# Patient Record
Sex: Male | Born: 1935 | Race: White | Hispanic: No | Marital: Married | Smoking: Former smoker
Health system: Southern US, Community
[De-identification: ages and names within clinical notes are randomized; demographics above are authoritative.]

## PROBLEM LIST (undated history)

## (undated) DIAGNOSIS — I700 Atherosclerosis of aorta: Secondary | ICD-10-CM

## (undated) DIAGNOSIS — Z950 Presence of cardiac pacemaker: Secondary | ICD-10-CM

## (undated) DIAGNOSIS — I10 Essential (primary) hypertension: Secondary | ICD-10-CM

## (undated) DIAGNOSIS — L719 Rosacea, unspecified: Secondary | ICD-10-CM

## (undated) DIAGNOSIS — N400 Enlarged prostate without lower urinary tract symptoms: Secondary | ICD-10-CM

## (undated) DIAGNOSIS — H9190 Unspecified hearing loss, unspecified ear: Secondary | ICD-10-CM

## (undated) DIAGNOSIS — R350 Frequency of micturition: Secondary | ICD-10-CM

## (undated) DIAGNOSIS — M1990 Unspecified osteoarthritis, unspecified site: Secondary | ICD-10-CM

## (undated) DIAGNOSIS — E079 Disorder of thyroid, unspecified: Secondary | ICD-10-CM

## (undated) DIAGNOSIS — K219 Gastro-esophageal reflux disease without esophagitis: Secondary | ICD-10-CM

## (undated) DIAGNOSIS — J342 Deviated nasal septum: Secondary | ICD-10-CM

## (undated) DIAGNOSIS — IMO0002 Reserved for concepts with insufficient information to code with codable children: Secondary | ICD-10-CM

## (undated) DIAGNOSIS — I2510 Atherosclerotic heart disease of native coronary artery without angina pectoris: Secondary | ICD-10-CM

## (undated) DIAGNOSIS — I739 Peripheral vascular disease, unspecified: Secondary | ICD-10-CM

## (undated) DIAGNOSIS — I639 Cerebral infarction, unspecified: Secondary | ICD-10-CM

## (undated) DIAGNOSIS — E785 Hyperlipidemia, unspecified: Secondary | ICD-10-CM

## (undated) DIAGNOSIS — I480 Paroxysmal atrial fibrillation: Secondary | ICD-10-CM

## (undated) DIAGNOSIS — E119 Type 2 diabetes mellitus without complications: Secondary | ICD-10-CM

## (undated) DIAGNOSIS — J189 Pneumonia, unspecified organism: Secondary | ICD-10-CM

## (undated) DIAGNOSIS — M329 Systemic lupus erythematosus, unspecified: Secondary | ICD-10-CM

## (undated) HISTORY — DX: Type 2 diabetes mellitus without complications: E11.9

## (undated) HISTORY — DX: Atherosclerosis of aorta: I70.0

## (undated) HISTORY — DX: Disorder of thyroid, unspecified: E07.9

## (undated) HISTORY — PX: CARDIAC CATHETERIZATION: SHX172

## (undated) HISTORY — DX: Atherosclerotic heart disease of native coronary artery without angina pectoris: I25.10

## (undated) HISTORY — DX: Paroxysmal atrial fibrillation: I48.0

## (undated) HISTORY — PX: CATARACT EXTRACTION W/ INTRAOCULAR LENS IMPLANT: SHX1309

## (undated) HISTORY — DX: Essential (primary) hypertension: I10

## (undated) HISTORY — PX: PROSTATE BIOPSY: SHX241

## (undated) HISTORY — DX: Hyperlipidemia, unspecified: E78.5

## (undated) HISTORY — DX: Peripheral vascular disease, unspecified: I73.9

## (undated) HISTORY — PX: COLONOSCOPY: SHX174

## (undated) HISTORY — PX: MULTIPLE TOOTH EXTRACTIONS: SHX2053

## (undated) HISTORY — DX: Benign prostatic hyperplasia without lower urinary tract symptoms: N40.0

## (undated) HISTORY — DX: Cerebral infarction, unspecified: I63.9

---

## 2004-07-29 ENCOUNTER — Ambulatory Visit: Payer: Self-pay | Admitting: Physician Assistant

## 2006-04-11 ENCOUNTER — Ambulatory Visit: Payer: Self-pay | Admitting: Unknown Physician Specialty

## 2006-07-23 ENCOUNTER — Ambulatory Visit: Payer: Self-pay | Admitting: Unknown Physician Specialty

## 2008-02-10 ENCOUNTER — Emergency Department: Payer: Self-pay | Admitting: Emergency Medicine

## 2008-02-10 ENCOUNTER — Other Ambulatory Visit: Payer: Self-pay

## 2008-06-12 ENCOUNTER — Ambulatory Visit: Payer: Self-pay | Admitting: Internal Medicine

## 2011-02-08 ENCOUNTER — Inpatient Hospital Stay: Payer: Self-pay | Admitting: Internal Medicine

## 2011-08-10 ENCOUNTER — Inpatient Hospital Stay: Payer: Self-pay | Admitting: Internal Medicine

## 2011-08-10 LAB — CBC
HCT: 43.9 % (ref 40.0–52.0)
HGB: 15.1 g/dL (ref 13.0–18.0)
MCH: 31 pg (ref 26.0–34.0)
Platelet: 160 10*3/uL (ref 150–440)
RDW: 13.3 % (ref 11.5–14.5)

## 2011-08-10 LAB — CK TOTAL AND CKMB (NOT AT ARMC)
CK, Total: 50 U/L (ref 35–232)
CK-MB: 1.1 ng/mL (ref 0.5–3.6)
CK-MB: 0.9 ng/mL (ref 0.5–3.6)

## 2011-08-10 LAB — COMPREHENSIVE METABOLIC PANEL
Albumin: 4 g/dL (ref 3.4–5.0)
Anion Gap: 11 (ref 7–16)
BUN: 15 mg/dL (ref 7–18)
EGFR (African American): >60
EGFR (Non-African Amer.): >60
Glucose: 165 mg/dL — ABNORMAL HIGH (ref 65–99)
Potassium: 3.9 mmol/L (ref 3.5–5.1)
SGOT(AST): 27 U/L (ref 15–37)
SGPT (ALT): 40 U/L
Total Protein: 7.3 g/dL (ref 6.4–8.2)

## 2011-08-10 LAB — TROPONIN I: Troponin-I: <0.02 ng/mL

## 2011-08-10 LAB — PROTIME-INR: INR: 0.8

## 2011-08-11 LAB — CBC WITH DIFFERENTIAL/PLATELET
Basophil #: 0 10*3/uL (ref 0.0–0.1)
Basophil %: 0.6 %
Eosinophil #: 0.2 10*3/uL (ref 0.0–0.7)
Eosinophil %: 2.7 %
HCT: 43.9 % (ref 40.0–52.0)
HGB: 14.9 g/dL (ref 13.0–18.0)
MCH: 30.9 pg (ref 26.0–34.0)
MCV: 91 fL (ref 80–100)
Monocyte #: 0.6 10*3/uL (ref 0.0–0.7)
Monocyte %: 8.5 %
Neutrophil #: 4.3 10*3/uL (ref 1.4–6.5)
Neutrophil %: 58.9 %
RBC: 4.83 10*6/uL (ref 4.40–5.90)
WBC: 7.3 10*3/uL (ref 3.8–10.6)

## 2011-08-11 LAB — BASIC METABOLIC PANEL
Chloride: 105 mmol/L (ref 98–107)
Co2: 26 mmol/L (ref 21–32)
Creatinine: 0.87 mg/dL (ref 0.60–1.30)
Glucose: 139 mg/dL — ABNORMAL HIGH (ref 65–99)
Potassium: 4.3 mmol/L (ref 3.5–5.1)
Sodium: 140 mmol/L (ref 136–145)

## 2011-08-11 LAB — CK TOTAL AND CKMB (NOT AT ARMC): CK-MB: 0.7 ng/mL (ref 0.5–3.6)

## 2011-08-11 LAB — LIPID PANEL
Cholesterol: 128 mg/dL (ref 0–200)
HDL Cholesterol: 33 mg/dL — ABNORMAL LOW (ref 40–60)
Ldl Cholesterol, Calc: 63 mg/dL (ref 0–100)
Triglycerides: 160 mg/dL (ref 0–200)

## 2011-08-11 LAB — TROPONIN I: Troponin-I: <0.02 ng/mL

## 2011-08-11 LAB — HEMOGLOBIN A1C: Hemoglobin A1C: 7.2 % — ABNORMAL HIGH (ref 4.2–6.3)

## 2013-04-30 ENCOUNTER — Ambulatory Visit: Payer: Self-pay | Admitting: Unknown Physician Specialty

## 2013-05-01 LAB — PATHOLOGY REPORT

## 2013-09-17 ENCOUNTER — Emergency Department: Payer: Self-pay | Admitting: Emergency Medicine

## 2013-09-17 LAB — COMPREHENSIVE METABOLIC PANEL
ALBUMIN: 3.8 g/dL (ref 3.4–5.0)
ALT: 28 U/L (ref 12–78)
ANION GAP: 5 — AB (ref 7–16)
Alkaline Phosphatase: 122 U/L — ABNORMAL HIGH
BUN: 16 mg/dL (ref 7–18)
Bilirubin,Total: 0.4 mg/dL (ref 0.2–1.0)
CHLORIDE: 103 mmol/L (ref 98–107)
CO2: 28 mmol/L (ref 21–32)
Calcium, Total: 9.9 mg/dL (ref 8.5–10.1)
Creatinine: 0.87 mg/dL (ref 0.60–1.30)
EGFR (African American): >60
GLUCOSE: 204 mg/dL — AB (ref 65–99)
Osmolality: 279 (ref 275–301)
POTASSIUM: 3.5 mmol/L (ref 3.5–5.1)
SGOT(AST): 22 U/L (ref 15–37)
SODIUM: 136 mmol/L (ref 136–145)
TOTAL PROTEIN: 7 g/dL (ref 6.4–8.2)

## 2013-09-17 LAB — CK TOTAL AND CKMB (NOT AT ARMC)
CK, TOTAL: 62 U/L
CK-MB: 1.4 ng/mL (ref 0.5–3.6)

## 2013-09-17 LAB — CBC
HCT: 40.9 % (ref 40.0–52.0)
HGB: 14.6 g/dL (ref 13.0–18.0)
MCH: 30.2 pg (ref 26.0–34.0)
MCHC: 35.7 g/dL (ref 32.0–36.0)
MCV: 85 fL (ref 80–100)
Platelet: 156 10*3/uL (ref 150–440)
RBC: 4.84 10*6/uL (ref 4.40–5.90)
RDW: 14.2 % (ref 11.5–14.5)
WBC: 8.9 10*3/uL (ref 3.8–10.6)

## 2013-09-17 LAB — TROPONIN I: Troponin-I: <0.02 ng/mL

## 2013-09-18 LAB — URINALYSIS, COMPLETE
BACTERIA: NONE SEEN
BILIRUBIN, UR: NEGATIVE
BLOOD: NEGATIVE
Glucose,UR: >=500 mg/dL (ref 0–75)
KETONE: NEGATIVE
Leukocyte Esterase: NEGATIVE
Nitrite: NEGATIVE
Ph: 6 (ref 4.5–8.0)
Protein: NEGATIVE
RBC,UR: 2 /HPF (ref 0–5)
SPECIFIC GRAVITY: 1.007 (ref 1.003–1.030)
Squamous Epithelial: NONE SEEN
WBC UR: NONE SEEN /HPF (ref 0–5)

## 2013-09-18 LAB — CK-MB: CK-MB: 1 ng/mL (ref 0.5–3.6)

## 2013-09-18 LAB — TROPONIN I: Troponin-I: <0.02 ng/mL

## 2013-09-18 LAB — TSH: THYROID STIMULATING HORM: 0.59 u[IU]/mL

## 2013-12-24 ENCOUNTER — Ambulatory Visit: Payer: Self-pay | Admitting: Internal Medicine

## 2014-03-27 ENCOUNTER — Telehealth: Payer: Self-pay

## 2014-03-27 NOTE — Telephone Encounter (Signed)
l  Mom for pt to schedule appt w dr Klein(NP) per Dr. Serafina Royals for afib.

## 2014-04-27 ENCOUNTER — Encounter: Payer: Self-pay | Admitting: *Deleted

## 2014-04-27 ENCOUNTER — Ambulatory Visit: Payer: Self-pay | Admitting: Ophthalmology

## 2014-04-28 ENCOUNTER — Encounter: Payer: Self-pay | Admitting: Internal Medicine

## 2014-04-28 ENCOUNTER — Ambulatory Visit (INDEPENDENT_AMBULATORY_CARE_PROVIDER_SITE_OTHER): Payer: Medicare PPO | Admitting: Internal Medicine

## 2014-04-28 VITALS — BP 98/59 | HR 60 | Ht 67.0 in | Wt 161.8 lb

## 2014-04-28 DIAGNOSIS — I480 Paroxysmal atrial fibrillation: Secondary | ICD-10-CM

## 2014-04-28 DIAGNOSIS — Z8673 Personal history of transient ischemic attack (TIA), and cerebral infarction without residual deficits: Secondary | ICD-10-CM

## 2014-04-28 DIAGNOSIS — R079 Chest pain, unspecified: Secondary | ICD-10-CM

## 2014-04-28 DIAGNOSIS — R001 Bradycardia, unspecified: Secondary | ICD-10-CM

## 2014-04-28 NOTE — Patient Instructions (Signed)
Your physician recommends that you schedule a follow-up appointment in:  3 months   Your next appointment will be scheduled in our new office located at :  [facility]  [address] [place]  [place], [place] [address]  Your physician recommends that you continue on your current medications as directed. Please refer to the Current Medication list given to you today.

## 2014-04-28 NOTE — Progress Notes (Signed)
ELECTROPHYSIOLOGY CONSULT NOTE  Patient ID: Ernest [name], MRN: [id number], DOB/AGE: [birth year] 78 y.o. Admit date: (Not on file) Date of Consult: 04/28/2014  Primary Physician: [name], MD Primary CardiologistBKowalski Chief Complaint:  Atrial fibrillaton   HPI Ernest [name] is a 78 y.o. male  Referred to internist and Dr. Nehemiah Massed for consideration of treatment options related to atrial fibrillation.  This has been going on for years episodes today lasting 3-4 hours.  Symptoms associated with this are largely chest discomfort without radiation. He has had 2  separate heart catheterizations which revealed only single vessel circumflex disease at about 70%. Initially this was done in 2009 and most recently 6/15. He is otherwise modest nonobstructive disease. LV function was normal at echocardiogram 3/15 indicating also modest left atrial enlargement (4.3) this was largely unchanged from an echocardiogram 8/12.  Triggers associated with these episodes of atrial fibrillation including exertion and according to his wife stress.  Thromboembolic risk factors include hypertension, diabetes, age, vascular disease and prior stroke. He has been treated with apixaban.   Past Medical History  Diagnosis Date  . Thyroid disease   . Hyperlipidemia   . BPH (benign prostatic hypertrophy)   . Diabetes mellitus without complication   . Hypertension   . Coronary artery disease   . Aortic atherosclerosis   . PVD (peripheral vascular disease)   . Stroke   . A-fib   . Arrhythmia       Surgical History:  Past Surgical History  Procedure Laterality Date  . Colonoscopy    . Prostate biopsy    . Cardiac catheterization      [facility]  . Cardiac catheterization      [facility]  . Cardiac catheterization      [facility]     Home Meds: Prior to Admission medications   Medication Sig Start Date End Date Taking? Authorizing Provider  apixaban (ELIQUIS) 5 MG TABS tablet Take 5 mg by mouth 2  (two) times daily.  02/25/14  Yes Historical Provider, MD  esomeprazole (NEXIUM) 20 MG capsule Take 20 mg by mouth daily.    Yes Historical Provider, MD  isosorbide mononitrate (IMDUR) 30 MG 24 hr tablet Take 30 mg by mouth daily.  11/06/13  Yes Historical Provider, MD  levothyroxine (SYNTHROID, LEVOTHROID) 125 MCG tablet Take 125 mcg by mouth daily before breakfast.   Yes Historical Provider, MD  lovastatin (MEVACOR) 40 MG tablet Take 40 mg by mouth at bedtime.    Yes Historical Provider, MD  metFORMIN (GLUCOPHAGE) 500 MG tablet Take 500 mg by mouth 2 (two) times daily with a meal.    Yes Historical Provider, MD  metoprolol tartrate (LOPRESSOR) 25 MG tablet Take 25 mg by mouth 2 (two) times daily.   Yes Historical Provider, MD  Pediatric Multiple Vit-C-FA (CHEWABLE VITE CHILDRENS) CHEW Chew by mouth daily.    Yes Historical Provider, MD  tamsulosin (FLOMAX) 0.4 MG CAPS capsule Take 0.4 mg by mouth daily.    Yes Historical Provider, MD      Allergies: No Known Allergies  History   Social History  . Marital Status: Married    Spouse Name: N/A    Number of Children: N/A  . Years of Education: N/A   Occupational History  . Not on file.   Social History Main Topics  . Smoking status: Former Research scientist (life sciences)  . Smokeless tobacco: Not on file  . Alcohol Use: No  . Drug Use: No  . Sexual Activity: Not on  file   Other Topics Concern  . Not on file   Social History Narrative  . No narrative on file     Family History  Problem Relation Age of Onset  . Heart attack Mother   . Heart attack Father      ROS:  Please see the history of present illness.   Negative except  Prostatism, stress,  All other systems reviewed and negative.    Physical Exam: Blood pressure 98/59, pulse 60, height 5\' 7"  (1.702 m), weight 161 lb 12 oz (73.369 kg). General: Well developed, well nourished male in no acute distress. Head: Normocephalic, atraumatic, sclera non-icteric, no xanthomas, nares are without  discharge. EENT: normal Lymph Nodes:  none Back: without scoliosis/kyphosis , no CVA tendersness Neck: Negative for carotid bruits. JVD not elevated. Lungs: Clear bilaterally to auscultation without wheezes, rales, or rhonchi. Breathing is unlabored. Heart: RRR with S1 S2.  2/6 systolic murmur , rubs, or gallops appreciated. Abdomen: Soft, non-tender, non-distended with normoactive bowel sounds. No hepatomegaly. No rebound/guarding. No obvious abdominal masses. Msk:  Strength and tone appear normal for age. Extremities: No clubbing or cyanosis. No edema.  Distal pedal pulses are 2+ and equal bilaterally. Skin: Warm and Dry Neuro: Alert and oriented X 3. CN III-XII intact Grossly normal sensory and motor function . Psych:  Responds to questions appropriately with a normal affect.      Labs: Cardiac Enzymes No results found for this basename: CKTOTAL, CKMB, TROPONINI,  in the last 72 hours CBC No results found for this basename: WBC, HGB, HCT, MCV, PLT   PROTIME: No results found for this basename: LABPROT, INR,  in the last 72 hours Chemistry No results found for this basename: NA, K, CL, CO2, BUN, CREATININE, CALCIUM, LABALBU, PROT, BILITOT, ALKPHOS, ALT, AST, GLUCOSE,  in the last 168 hours Lipids No results found for this basename: CHOL, HDL, LDLCALC, TRIG   BNP No results found for this basename: probnp   Miscellaneous No results found for this basename: DDIMER    Radiology/Studies:  No results found.  EKG:  Sinus rhythm at 60 with intervals 15/09/38 Otherwise normal  Holter monitors were reviewed from 9/15. Demonstrating frequent nonsustained runs of atrial tachycardia, minimum heart rates during waking hours in the 60 range  Assessment and Plan:  Paroxysmal atrial fibrillation  Prior stroke  Coronary artery disease-1 vessel  Sinus bradycardia   The patient has tachybradycardia syndrome with significant symptoms associated with rapid atrial fibrillation. Given  his propensity towards bradycardia, I would lean towards a rhythm control in strategy the options which could include antiarrhythmic therapy specifically initially dofetilide and then amiodarone, the latter likely necessitating a need for backup bradycardia pacing. He is not a candidate for a 1C agent because of his coronary artery disease.   It is this issue of underlying bradycardia that makes it less enthusiastic about a rate control in strategy; given the rate of his atrial fibrillation--200 beats per minute, I am not sanguine that we will be able to accomplish this anyway without the need for AV ablation  We discussed alternatives to apixaban given cost. His wife elected to stay on apixaban.  At this juncture they would like to continue the current course of therapy. We will regroup in about 3 months to review options again.Virl Axe

## 2014-07-27 ENCOUNTER — Ambulatory Visit: Payer: Self-pay | Admitting: Physical Medicine and Rehabilitation

## 2014-08-04 ENCOUNTER — Ambulatory Visit (INDEPENDENT_AMBULATORY_CARE_PROVIDER_SITE_OTHER): Payer: Commercial Managed Care - HMO | Admitting: Internal Medicine

## 2014-08-04 ENCOUNTER — Encounter: Payer: Self-pay | Admitting: Internal Medicine

## 2014-08-04 VITALS — BP 118/72 | HR 68 | Ht 67.0 in | Wt 168.0 lb

## 2014-08-04 DIAGNOSIS — I10 Essential (primary) hypertension: Secondary | ICD-10-CM

## 2014-08-04 NOTE — Progress Notes (Signed)
Electrophysiology Office Note   Date:  08/04/2014   ID:  Ernest [name], DOB 09-20-1935, MRN [id number]  PCP:  Ernest Fennel, MD  Cardiologist:  NA-TF Primary Electrophysiologist:   Ernest Axe, MD    No chief complaint on file.    History of Present Illness: Ernest [name] is a 79 y.o. male seen in electrophysiology followup  for  atrial fibrillation-paroxysmal with rapid ventricular response associated with sinus bradycardia. He has a history of coronary disease. He is managed with apixaban.  At the last visit we discussed alternative strategies of rate control versus rhythm control. The concerns with the former was that it would precipitate the need for backup bradycardia pacing; the use of amiodarone for almost certainly accomplish the same thing. We discussed dofetilide. At the last visit they decided to hold the steady course with the plan to repeat group to discuss again.   Today, he denies symptoms of  chest pain, shortness of breath, orthopnea, PND, lower extremity edema, bleeding, or neurologic sequela.  he has no  complaints of  lightheadedness presyncope or syncope.  He has had infrequent episodes of palpitations consistent with his atrial fibrillation; it has largely been however relatively brief and not poorly tolerated  The patient is tolerating medications without difficulties and is otherwise without complaint today.    Past Medical History  Diagnosis Date  . Thyroid disease   . Hyperlipidemia   . BPH (benign prostatic hypertrophy)   . Diabetes mellitus without complication   . Hypertension   . Coronary artery disease   . Aortic atherosclerosis   . PVD (peripheral vascular disease)   . Stroke   . A-fib   . Arrhythmia    Past Surgical History  Procedure Laterality Date  . Colonoscopy    . Prostate biopsy    . Cardiac catheterization      [facility]  . Cardiac catheter[facility]      [facility]  . Cardiac catheterization      [facility]     Current  Outpatient Prescriptions  Medication Sig Dispense Refill  . apixaban (ELIQUIS) 5 MG TABS tablet Take 5 mg by mouth 2 (two) times daily.     Marland Kitchen esomeprazole (NEXIUM) 20 MG capsule Take 20 mg by mouth daily.     . isosorbide mononitrate (IMDUR) 30 MG 24 hr tablet Take 30 mg by mouth daily.     Marland Kitchen levothyroxine (SYNTHROID, LEVOTHROID) 125 MCG tablet Take 125 mcg by mouth daily before breakfast.    . lovastatin (MEVACOR) 40 MG tablet Take 40 mg by mouth at bedtime.     . metFORMIN (GLUCOPHAGE) 500 MG tablet Take 500 mg by mouth 2 (two) times daily with a meal.     . metoprolol tartrate (LOPRESSOR) 25 MG tablet Take 25 mg by mouth 2 (two) times daily.    . Pediatric Multiple Vit-C-FA (CHEWABLE VITE CHILDRENS) CHEW Chew by mouth daily.     . tamsulosin (FLOMAX) 0.4 MG CAPS capsule Take 0.4 mg by mouth daily.      No current facility-administered medications for this visit.    Allergies:   Review of patient's allergies indicates no known allergies.   Social History:  The patient  reports that he has quit smoking. He does not have any smokeless tobacco history on file. He reports that he does not drink alcohol or use illicit drugs.   Family History:  The patient's family history includes Heart attack in his father and mother.    ROS:  Please see the history of present illness.   Otherwise, review of systems is negative .    PHYSICAL EXAM: VS:  Ht 5\' 7"  (1.702 m) , BMI There is no weight on file to calculate BMI. GEN: Well nourished, well developed, in no acute distress HEENT: normal Neck: JVD flat, carotid bruits, or masses Cardiac: R RR;  no murmur  rubs, + S4  Respiratory:  clear to auscultation bilaterally, normal work of breathing Back without kyphosis or CVAT GI: soft, nontender, nondistended, + BS MS: no deformity or atrophy Skin: warm and dry,  Extremities No Clubbing cyanosis  Edema Neuro:  Strength and sensation are intact Psych: euthymic mood, full affect  EKG:  EKG is ordered  today. The ekg ordered today shows sinus rhythm at 68 Intervals 15/09/37 Otherwise normal   Recent Labs: No results found for requested labs within last 365 days.    Lipid Panel  No results found for: CHOL, TRIG, HDL, CHOLHDL, VLDL, LDLCALC, LDLDIRECT   Wt Readings from Last 3 Encounters:  04/28/14 161 lb 12 oz (73.369 kg)      Other studies Reviewed: none   ASSESSMENT AND PLAN:  Atrial fibrillation-paroxysmal  Coronary artery disease  Prior stroke  Sinus bradycardia  Without symptoms of ischemia  Atrial fibrillation is relatively infrequent. So as to not aggravate his underlying sinus bradycardia, I suggested that if he has recurrent atrial fibrillation that persists for any period of time, he could take an extra metoprolol 25 every 6 hours. At this juncture we will not change our strategy in terms of pursuing rhythm control as the episodes are infrequent and relatively well-tolerated.  He is tolerating his apixaban  Current medicines are reviewed at length with the patient today.   The patient does not have concerns regarding his medicines.  The following changes were made today:  none  Labs/ tests ordered today include:    Orders Placed This Encounter  Procedures  . EKG 12-Lead     Disposition:   FU with me  6 month(s)   Signed, Ernest Axe, MD  08/04/2014 11:37 AM     [place]  [address] [phone] (office) [phone] (fax)

## 2014-08-04 NOTE — Patient Instructions (Signed)
Your physician wants you to follow-up in: 6 months  You will receive a reminder letter in the mail two months in advance. If you don't receive a letter, please call our office to schedule the follow-up appointment.  Your physician recommends that you continue on your current medications as directed. Please refer to the Current Medication list given to you today.  

## 2014-10-31 NOTE — Op Note (Signed)
PATIENT NAME:  Ernest [name], PAFF MR#:  [id number] DATE OF BIRTH:  1936-03-13  DATE OF PROCEDURE:  04/27/2014  PREOPERATIVE DIAGNOSIS: Cataract, right eye.   POSTOPERATIVE DIAGNOSIS:  Cataract, right eye.  PROCEDURE PERFORMED: Extracapsular cataract extraction using phacoemulsification with placement Alcon [id number], 24.5 diopter posterior chamber lens, serial number [id number]   SURGEON: Remo Lipps A. Quantae Martel, MD    ANESTHESIA:  4% lidocaine, 0.75% Marcaine, 50-50 mixture of 10 units/mL of Hylenex added, given as a peribulbar.   ANESTHESIOLOGIST:  Dr. Kayleen Memos   COMPLICATIONS: None.   ESTIMATED BLOOD LOSS: Less than 1 mL.    DESCRIPTION OF PROCEDURE:  The patient was brought to the operating room and given a peribulbar block.  The patient was then prepped and draped in the usual fashion.  The vertical rectus muscles were imbricated using 5-0 silk sutures.  These sutures were then clamped to the sterile drapes as bridle sutures.  A limbal peritomy was performed extending two clock hours and hemostasis was obtained with cautery.  A partial thickness scleral groove was made at the surgical limbus and dissected anteriorly in a lamellar dissection using an Alcon crescent knife.  The anterior chamber was entered superonasally with a Superblade and through the lamellar dissection with a 2.6 mm keratome.  DisCoVisc was used to replace the aqueous and a continuous tear capsulorrhexis was carried out.  Hydrodissection and hydrodelineation were carried out with balanced salt and a 27 gauge canula.  The nucleus was rotated to confirm the effectiveness of the hydrodissection.  Phacoemulsification was carried out using a divide-and-conquer technique.  Total ultrasound time was  2 minute 50 seconds with an average power of 25.3%, CDE of 50.2.  Irrigation/aspiration was used to remove the residual cortex.  DisCoVisc was used to inflate the capsule and the internal incision was enlarged to 3 mm with the  crescent knife.  The intraocular lens was folded and inserted into the capsular bag using the AcrySert delivery system.  Irrigation/aspiration was used to remove the residual DisCoVisc.  Miostat was injected into the anterior chamber through the paracentesis track to inflate the anterior chamber and induce miosis.  A  0.20 mL of cefuroxime containing 1 mg of drug was injected via the paracentesis tract.  The wound was checked for leaks and none were found. The conjunctiva was closed with cautery and the bridle sutures were removed.  Two drops of 0.3% Vigamox were placed on the eye.   An eye shield was placed on the eye.  The patient was discharged to the recovery room in good condition.    ____________________________ Loura Back Zyion Doxtater, MD sad:DT D: 04/27/2014 08:28:04 ET T: 04/27/2014 08:53:55 ET JOB#: [id number]  cc: Remo Lipps A. Keri Veale, MD, <Dictator> Martie Lee MD ELECTRONICALLY SIGNED 04/27/2014 13:34

## 2014-10-31 NOTE — H&P (Signed)
PATIENT NAME:  Ernest [name], Ernest [name] MR#:  [id number] DATE OF BIRTH:  1936-05-14  DATE OF ADMISSION:  09/17/2013  REFERRING PHYSICIAN: Dr. Marjean Donna.   PRIMARY CARE PHYSICIAN: Dr. Baldemar Lenis.    CHIEF COMPLAINT: Chest pain.   HISTORY OF PRESENT ILLNESS: A 79 year old Caucasian gentleman with a past medical history of atrial fibrillation, type 2 diabetes, hypertension, hyperlipidemia, gastroesophageal reflux disease who presented with chest pain. Describes a 1 day duration of retrosternal chest pain which originally occurred at 8 p.m. the day prior to admission. Describes this as nonradiating, quality only as pain, intensity 10 out of 10. No worsening or relieving factors. No further associated symptoms. Denies any shortness of breath, diaphoresis, nausea, vomiting, cough. On arrival to the Emergency Department, he was found to be in atrial fibrillation with rapid ventricular response, heart rate of 140s, with spontaneous conversion to normal sinus rhythm. Currently, he is chest pain-free. He has no further complaints.   REVIEW OF SYSTEMS:  CONSTITUTIONAL: Denies fever, fatigue, weakness.  EYES: Denies blurred vision, double vision, eye pain.  ENT: Denies tinnitus, ear pain, hearing loss.  RESPIRATORY: Denies cough, wheeze, shortness of breath.  CARDIOVASCULAR: Positive for chest pain as described above. Denies any orthopnea, edema, palpitations.  GASTROINTESTINAL: Denies nausea, vomiting, diarrhea or abdominal pain.  GENITOURINARY: No dysuria or hematuria.  ENDOCRINE: Denies nocturia or thyroid problems.  HEMATOLOGIC AND LYMPHATIC: Denies easy bruising, bleeding.  SKIN: Denies rash or lesion.  MUSCULOSKELETAL: Denies pain in neck, back, shoulders, knees, hips or arthritic symptoms.  NEUROLOGIC: Denies paralysis, paresthesias.  PSYCHIATRIC: Denies anxiety or depressive symptoms.   Otherwise, a full review of systems performed by me is negative.   PAST MEDICAL HISTORY: Diabetes, hypertension,  hyperlipidemia, hypothyroidism, gastroesophageal reflux disease, BPH, atrial fibrillation diagnosed approximately 1 week ago by Dr. Nehemiah Massed.   SOCIAL HISTORY: Remote tobacco use. Denies any alcohol use. Denies any drug use.   FAMILY HISTORY: Positive for coronary artery disease.   ALLERGIES: No known drug allergies.   HOME MEDICATIONS: Flomax 0.4 mg p.o. daily, Xarelto 20 mg p.o. daily, metformin 500 mg p.o. daily, lovastatin 40 mg p.o. at bedtime, metoprolol 25 mg p.o. b.i.d., levothyroxine 75 mcg p.o. daily.   PHYSICAL EXAMINATION:  VITAL SIGNS: Temperature 97.9, heart rate on arrival 132, currently 60, respirations 20, blood pressure 161/84, saturating 97% on supplemental O2. Weight 73.9 kg, BMI 25.6.  GENERAL: Well-nourished, well-developed, Caucasian gentleman currently in no acute distress.  HEAD: Normocephalic, atraumatic.  EYES: Pupils equal, round and reactive to light. Extraocular muscles intact. No scleral icterus.  MOUTH: Moist mucosal membranes. Dentition intact. No abscess noted.  EARS, NOSE, THROAT: Throat clear without exudates. No external lesions.  NECK: Supple. No thyromegaly. No nodules. No JVD.  PULMONARY: Clear to auscultation bilaterally without wheeze, rubs or rhonchi. No use of accessory muscles. Good respiratory effort.  CHEST: Nontender to palpation.  CARDIOVASCULAR: S1, S2, regular rate and rhythm. No murmurs, rubs or gallops. No edema. Pedal pulses 2+ bilaterally.  GASTROINTESTINAL: Soft, nontender, nondistended. No masses. Positive bowel sounds. No hepatosplenomegaly.   MUSCULOSKELETAL: No swelling, clubbing, edema. Range of motion full in all extremities.  NEUROLOGIC: Cranial nerves II through XII intact. No gross focal neurological deficits. Sensation intact. Reflexes intact.  SKIN: No ulcerations, lesions, rash, cyanosis. Skin warm, dry. Turgor intact.  PSYCHIATRIC: Mood and affect within normal limits. The patient is awake, alert and oriented x 3. Insight  and judgment intact.   LABORATORY DATA: EKG performed revealing atrial fibrillation, heart rate 137. No ST  or T wave abnormalities. Remainder of laboratory data: Sodium 136, potassium 3.5, chloride 103, bicarb 28, BUN 16, creatinine 0.87, glucose 204. LFTs: Alk phos of 122, otherwise within normal limits. Troponin I less than 0.02. WBC 8.9, hemoglobin 14.6, platelets of 156. Urinalysis negative for evidence of infection. Chest x-ray performed revealing no acute cardiopulmonary process.   ASSESSMENT AND PLAN: A 79 year old Caucasian male with history of atrial fibrillation, diabetes, hypertension. Presenting with chest pain.  1. Chest pain: Currently asymptomatic. Will place on telemetry under observation status. Continue aspirin and statin therapy. Trend cardiac enzymes x 3. Will consult his cardiologist, Dr. Nehemiah Massed, who he has followed with for many years.  2. Atrial fibrillation with rapid ventricular response: Currently in normal sinus rhythm. Continue Xarelto for anticoagulation. Will check a TSH as well as transthoracic echocardiogram and continue p.o. medications.  3. Diabetes: Hold p.o. agents. Add insulin sliding scale, q.6 hour Accu-Cheks.  4. Hyperlipidemia: Continue his home does of statin therapy, lovastatin.  5. Venous thromboembolism prophylaxis: The patient is on Xarelto.   The patient is FULL CODE.   TIME SPENT: 45 minutes.    ____________________________ Aaron Mose. Jonice Cerra, MD dkh:gb D: 09/18/2013 02:04:50 ET T: 09/18/2013 02:44:22 ET JOB#: [id number]  cc: Aaron Mose. Annastasia Haskins, MD, <Dictator> Emmelia Holdsworth Woodfin Ganja MD ELECTRONICALLY SIGNED 09/18/2013 20:23

## 2014-10-31 NOTE — Discharge Summary (Signed)
PATIENT NAME:  Ernest [name], Ernest [name] MR#:  [id number] DATE OF BIRTH:  [birth year]  DATE OF ADMISSION:  09/17/2013 DATE OF DISCHARGE:  09/18/2013  PRIMARY CARE PHYSICIAN:  Dr. Zola Button.  FINAL DIAGNOSES: 1.  Chest pain.  2.  Rapid atrial fibrillation.  3.  History of cerebrovascular accident in the past.  4.  Hyperlipidemia.  5.  Hypothyroidism.  6.  Diabetes.  7.  Benign prostatic hypertrophy.   MEDICATIONS ON DISCHARGE:  Include lovastatin 40 mg once a day at bedtime, Flomax 0.4 mg daily, metformin 500 mg daily, levothyroxine 75 mcg daily, Xarelto 20 mg once a day at 5:00 p.m., metoprolol 25 mg 1 tablet twice a day, can take an extra dose if heart rate is fast.   DIET:  Low sodium diet, carbohydrate-controlled diet, regular consistency.   ACTIVITY:  As tolerated.   FOLLOW-UP:  With Dr. Nehemiah Massed next week, 1 to 2 weeks with Dr. Baldemar Lenis.   LABORATORY AND RADIOLOGICAL DATA DURING THE HOSPITAL COURSE:  EKG showed atrial fibrillation, rapid ventricular response, 137 beats per minute.  TSH 0.59.  D-dimer negative.  Troponin negative.  White blood cell count 8.9, hemoglobin and hematocrit 14.6 and 40.9, platelet count of 156, glucose 204, BUN 16, creatinine 0.87, sodium 136, potassium 3.5, chloride 103, CO2 28, calcium 9.9.  Liver function tests:  Alkaline phosphatase slightly elevated at 122.  Other liver function tests normal range.  Troponin negative.  Urinalysis showed 500 mg/dL of glucose.  Chest x-ray showed no acute cardiopulmonary process.   HOSPITAL COURSE PER PROBLEM LIST:  1.  The patient's chest pain happened when the patient had rapid atrial fibrillation.  Two sets of cardiac enzymes were negative.  There was a set of cardiac enzymes that was done in between, but that did not include a troponin so actually the second troponin is more like the third troponin, so cardiac enzymes were negative.  This is all rate-related.  2.  Rapid atrial fibrillation.  The patient is on metoprolol 25 mg  twice a day.  Current heart rate on discharge was 62 so I am hesitant on increasing the patient's metoprolol, but if he goes into rapid atrial fibrillation he can take an extra dose of metoprolol.  3.  History of CVA in the past with the atrial fibrillation now.  He is on Xarelto for anticoagulation.  4.  Hyperlipidemia, on lovastatin.  5.  Hypothyroidism, on levothyroxine.  6.  Diabetes, on metformin.  7.  Benign prostatic hypertrophy, on Flomax.   Follow up with Dr. Nehemiah Massed as outpatient.  May need to consider antiarrhythmics if the patient continues to go into rapid atrial fibrillation.     Time spent on discharge 35 minutes.  The patient was seen in consultation by Dr. Ubaldo Glassing covering for Dr. Nehemiah Massed.     ____________________________ Tana Conch. Leslye Peer, MD rjw:ea D: 09/18/2013 15:49:06 ET T: 09/19/2013 02:42:44 ET JOB#: [id number]  cc: Tana Conch. Leslye Peer, MD, <Dictator> Derinda Late, MD Corey Skains, MD Marisue Brooklyn MD ELECTRONICALLY SIGNED 09/27/2013 15:11

## 2014-10-31 NOTE — Consult Note (Signed)
Brief Consult Note: Diagnosis: Pt with intermitant afib treated with metoprolol tartrate bid and xarelto for anticoagulation. Presented to the er withi afib with rvr.  Converted back to nsr.   Patient was seen by consultant.   Discussed with Attending MD.   Comments: 79 yo male with history of afib treated with metoprolol tartrate bid and xarelto for anticoagulation. Presented with recurrent afib with rvr. Has converted back to nsr. Echo unremarkable for signficant abnormality. Ruled out for mi thus far. Would continue with metoprolol bid at current dose and continue xarelto. Would recommend taking an extra metoprolol tartrate for break through. Conitnue iwth xarelto. OK for discharge from cardiac standpoint. Follow up withi Dr.  Nehemiah Massed in 1 week.  Electronic Signatures: Teodoro Spray (MD)  (Signed 12-Mar-15 13:07)  Authored: Brief Consult Note   Last Updated: 12-Mar-15 13:07 by Teodoro Spray (MD)

## 2014-11-01 NOTE — Discharge Summary (Signed)
PATIENT NAME:  Ernest [name], Ernest [name] MR#:  [id number] DATE OF BIRTH:  [birth year]  DATE OF ADMISSION:  08/10/2011 DATE OF DISCHARGE:  08/11/2011  ADMITTING PHYSICIAN: Dagoberto Ligas, MD    DISCHARGING PHYSICIAN: Darlis Wragg A. Royden Purl, MD   PRIMARY CARE PHYSICIAN: Apolonio Schneiders, MD    REFERRING PHYSICIAN: Marjean Donna, MD   ADMITTING DIAGNOSIS: Left arm weakness, tingling.   DISCHARGE DIAGNOSES:  1. Recurrent left arm tingling secondary to recurrent lacunar infarct.  2. Hypertension. 3. Hyperlipidemia. 4. Type 2 diabetes.  5. Gastroesophageal reflux disease.  6. Chronic gastritis. 7. Benign prostatic hypertrophy. 8. Hyperlipidemia.   CONSULTANTS:  1. Case Management.  2. Dr. Michaela Corner, Neurology.   HOSPITAL LABORATORY, DIAGNOSTIC AND RADIOLOGICAL DATA:  Chest x-ray on 08/10/2011 showed no acute cardiopulmonary disease.  CT of the head showed no intracranial hemorrhage. Findings representing sequelae of evolving infarcts of 02/08/2011.  MRI of the brain on 08/10/2011 showed findings which reflect sequelae of small recurrent regions of lacunar infarction acute/subacute within the posterior aspect of the apex of the right frontal lobe, involutional and chronic changes.   HOSPITAL COURSE: Initial History and Physical were done by Dr. Holley Raring. Please refer to his note dated 08/10/2011 for complete details. In brief, this is a 79 year old white male with past medical history of hypertension, hyperlipidemia, type 2 diabetes, benign prostatic hypertrophy, former tobacco abuse, gastroesophageal reflux disease, who had a cerebrovascular accident in August 2012 with no residual deficits, who is on Plavix, who presents with left arm numbness and tingling which was transient, lasting nine hours. The patient has been noncompliant with his diet. He was on aspirin before and was switched to Plavix. He reports compliance with his Plavix, but he has not been taking his Nexium as he has had no problems with  gastroesophageal reflux disease or indigestion. The patient was admitted to the Hospitalist Service for transient ischemic attack, cerebrovascular accident.   1. Recurrent transient ischemic attack versus cerebrovascular accident: The patient is already on Plavix. We held off on adding aspirin. I did not think there would be any benefits. There would be increased risk of gastritis and gastroesophageal reflux disease. The patient had recent echo and carotid Dopplers, thus, we did not repeat these. Hemoglobin A1c was checked and was found to be size 7.2. Fasting lipid panel was found to be with LDL of 63. The patient was evaluated by PT/OT and had no residual deficits and did not require any rehabilitation. We were waiting for Dr. Loletta Specter to see the patient. The patient and family became impatient on Friday evening on 08/11/2011 asked to be discharged with follow up with him as outpatient.  2. Hypertension: We allowed for permissive hypertension, held his blood pressure medications. 3. Hyperlipidemia: We continued statin. As noted, LDL was fairly well controlled.  4. Type 2 diabetes: A1c was found to be 7.2. The patient was encouraged to continue ADA diet and be more compliant and continue metformin. We increased his dose of his metformin.  5. Gastroesophageal reflux disease/chronic gastritis: We avoided use of Nexium and Plavix as there is sometimes interaction and considered starting on Zantac. However, the patient stayed asymptomatic. 6. Benign prostatic hypertrophy: This was stable.  7. Deep vein thrombosis prophylaxis: Maintained with TED stockings and SCDs. The patient was ambulatory. The patient at discharge was on Plavix and aspirin.   DISCHARGE PHYSICAL EXAMINATION: The patient was discharged on 08/11/2011. VITAL SIGNS: Temperature 97.3, heart rate 85, respiratory rate 18, blood pressure 139/78, saturating 97% on room air.  LUNGS: Clear to auscultation. CARDIOVASCULAR: Regular rate and rhythm.  NEUROLOGIC: Cranial nerves II through XII are intact. Reflexes +2 patellar. No dysarthria or cerebellar dysfunction. Strength 5 out of 5.   DISCHARGE MEDICATIONS:  1. Lovastatin 40 mg daily.  2. Tamoxifen 0.4 mg daily. 3. Metoprolol succinate extended release 50 mg daily.  4. Plavix 75 mg daily.  5. Potassium 75 mcg daily.  6. Metformin 500 mg p.o. b.i.d.  OXYGEN: None.   DIET: Low sodium, ADA, low fat diet.   ACTIVITY: As tolerated.   FOLLOWUP:  1. The patient is to see Dr. Arline Asp in 2 weeks.  2. The patient is to get referral for Dr. Loletta Specter to see him in 1 to 2 weeks.   CODE STATUS: FULL CODE.   TOTAL TIME SPENT ON DISCHARGE: 45 minutes.   ____________________________ Judeth Horn Royden Purl, MD aaf:cbb D: 08/14/2011 16:41:40 ET T: 08/15/2011 12:20:05 ET JOB#: [id number]  cc: Mike Craze A. Royden Purl, MD, <Dictator> Vianne Bulls. Arline Asp, MD Rudell Cobb Loletta Specter, MD Mike Craze Cassell Clement MD ELECTRONICALLY SIGNED 08/17/2011 14:16

## 2014-11-01 NOTE — H&P (Signed)
PATIENT NAME:  Ernest [name], OLGIN MR#:  [id number] DATE OF BIRTH:  Mar 07, 1936  DATE OF ADMISSION:  08/10/2011   REFERRING PHYSICIAN: Marjean Donna, MD   PRIMARY CARE PHYSICIAN: Apolonio Schneiders, MD   CHIEF COMPLAINT: "My arm felt numb and tingly".  HISTORY OF PRESENT ILLNESS: The patient is a very pleasant 79 year old male with past medical history as listed below including hypertension, hyperlipidemia, type II diabetes mellitus, benign prostatic hypertrophy, former tobacco abuse, chronic gastritis and gastroesophageal reflux disease, history of right parietal CVA August 2012 (with multiple areas of ischemia noted on MRI) with no residual deficits who presented to the Emergency Department with the above-mentioned chief complaint. The patient states that around midnight 08/10/2011 when he was going to bed he noticed that his left arm was numb and tingly and he had some weakness of the left upper extremity and hand. Wife drove the patient to the ER around 12:45 but exact timing is unclear per the wife. At first he also had some difficulty controlling and moving his left arm and hand, especially the hand. After at least 9 hours in the ER, his strength involving the hand and arm have improved and his tingling has also somewhat improved but he still has some persistent numbness of the left arm and hand. Noncontrast head CT was obtained in the ER which reveals foci of low attenuation of the right parietal lobe which likely represents sequela of previously noted infarcts and no new infarcts were seen. However, report on CT states findings most likely represent sequela of evolving infarcts from 02/08/2011. The patient's wife provides additional history and states the patient has been noncompliant with his recommended diet and has been eating Cheetos, Fritos, and french fries. He also visits the pool hall twice a week and is exposed to secondhand smoke but is no longer smoking. He failed aspirin and oxygen in 2012 when  he had his stroke and at that time he was switched to Plavix and he reports compliance with his Plavix in addition to the remainder of his medications. However, he has not been taking his Nexium as he has not had any problems with indigestion recently over the past few weeks. Otherwise, the patient is without specific complaints at this time. Hospitalist services were contacted for further evaluation and for hospital admission.       PAST SURGICAL HISTORY:  1. Prostate biopsy. 2. Cardiac catheterization with normal coronary arteries in 1992 and repeat cardiac catheterization 07/2010 revealing no significant coronary artery disease.  3. Esophagogastroduodenoscopy revealing gastritis in 1991. 4. Colon polypectomy.  PAST MEDICAL HISTORY:  1. Hypertension. 2. Hyperlipidemia. 3. Type II diabetes mellitus. 4. Hypothyroidism.  5. History of recurrent chest pain which is felt to be related to chronic gastritis and gastroesophageal reflux disease and felt to be noncardiac as he had normal cardiac catheterization in 1992 and repeat cardiac catheterization 07/2010 revealing no significant disease.  6. Gastroesophageal reflux disease. 7. Chronic gastritis. 8. Restless leg syndrome.  9. Colon polyps and tubular villous adenoma.  10. History of prostatitis.   11. History of benign prostatic hypertrophy.  28. Former tobacco abuse.   30. History of cerebrovascular accident in August 2012 with no residual deficits and the patient had echocardiogram revealing mild to moderate MR and TR and mild LVH and he had no hemodynamically significant stenosis noted on carotid artery Doppler ultrasound from August 2012.   ALLERGIES: No known drug allergies.    HOME MEDICATIONS:  1. Plavix 75 mg daily. 2. Metformin  500 mg daily. 3. Metoprolol succinate (Toprol-XL) 50 mg daily. 4. Flomax 0.4 mg daily. 5. Synthroid 75 mcg daily. 6. Lovastatin 40 mg daily.  7. Acetaminophen p.r.n. at nighttime.  8. The patient is  no longer taking Nexium over the past few weeks because he is no longer having indigestion.   FAMILY HISTORY: Mother died of MI at age 70. Father died of MI at age 55 and also had malignancy.   SOCIAL HISTORY: Tobacco none. The patient used to smoke but quit in 1984. He smoked for approximately 23 years and was smoking about a pack a day. Alcohol none. Illicit drugs none. The patient is married with no children. Wife accompanies him today. The patient is a retired Dealer. He is originally from [place].   REVIEW OF SYSTEMS: CONSTITUTIONAL: Denies fevers, chills, or recent changes in weight or weakness, numbness, tingling, and wheezing in left upper extremity. HEAD/EYES: Denies headache or blurry vision. ENT: Denies tinnitus, earache, nasal discharge, or sore throat. RESPIRATORY: Denies shortness breath, cough, or wheezing. CARDIOVASCULAR: Denies chest pain, heart palpitations, or lower extremity edema. GI: Denies nausea, vomiting, diarrhea, constipation, melena, hematochezia, or abdominal pains. GU: Denies dysuria or hematuria. ENDOCRINE: Denies heat or cold intolerance. HEME/LYMPH: Reports easy bruising. Denies any bleeding. INTEGUMENTARY: Denies rash, acne, or any lesions. MUSCULOSKELETAL: Denies joint pain. Has muscle weakness in the left arm currently. NEUROLOGIC: Left arm and hand numbness, tingling, and weakness. Denies dysarthria or headache. PSYCH: Denies depression or anxiety.   PHYSICAL EXAMINATION:   VITAL SIGNS: Temperature afebrile, pulse 67, blood pressure 140/81, respirations 20, oxygen sat 97% on room air.   GENERAL: Pleasant male alert and oriented not acutely distressed.   HEENT: Normocephalic, atraumatic. Pupils equal, round, and reactive to light and accommodation. Extraocular movements intact. Anicteric sclerae. Conjunctivae pink. Hearing intact to voice. Nares without drainage. Oral mucosa moist without lesions. Tongue protrudes in midline. No facial droop. Speech is clear.    NECK: Supple with full range of motion. No JVD, lymphadenopathy, or carotid bruits bilaterally. No thyromegaly or tenderness to palpation over the thyroid gland.   CHEST: Normal respiratory effort without use of accessory respiratory muscles. Lungs are clear to auscultation bilaterally without crackles, rales, or wheezes.   CARDIOVASCULAR: S1, S2 positive. Regular rate and rhythm. No murmurs, rubs, or gallops. PMI is non-lateralized.   ABDOMEN: Soft, nontender, nondistended. Normoactive bowel sounds. No hepatosplenomegaly or palpable masses. No hernias.   EXTREMITIES: No clubbing, cyanosis, or edema. Pedal pulses are palpable bilaterally.   SKIN: No suspicious rashes. Skin turgor is good.   LYMPH: No cervical lymphadenopathy.   NEUROLOGIC: Alert and oriented x3. Cranial nerves II through XII intact. Upon arrival to the ER, the patient had left pronator drift but this is no longer present on my exam. Strength is 4 out of 5 on the left hand and left upper extremity. Otherwise, strength is 5 out of 5 in right upper extremity and bilateral lower extremities. Sensation intact to light touch throughout. Decreased sensation to sharp versus dull and pressure sensation in the left arm and left hand. Hypoactive patellar deep tendon reflexes and bicipital deep tendon reflexes bilaterally. Babinski sign absent on the left, positive on the right with extensor response. Finger-to-nose testing intact on the right, slightly abnormal on the left. No facial droop. Speech is clear. Tongue protrudes in midline. Concentration, attention, recent/remote memory are intact.   PSYCH: Pleasant male with appropriate affect.   LABORATORY, DIAGNOSTIC, AND RADIOLOGICAL DATA: Noncontrast head CT no evidence of midline  shift, mass effect, extra-axial fluid collection, or intracranial hemorrhage. Mild periventricular hypoattenuation likely sequela of chronic microangiopathy, foci of low attenuation in the right parietal lobe  likely sequela of the infarct seen on the prior MRI. No new cortical-based infarct is seen. Osseous structures are unremarkable. Impression no intracranial hemorrhage, findings likely represent sequela of evolving infarcts from 02/08/2011. If there is clinical concern for an acute infarct, follow-up MRI is suggested versus repeat CT scan in 24 hours.   Portable chest x-ray no acute cardiopulmonary abnormalities are noted. EKG normal sinus rhythm, heart rate 66 beats per minute with normal axis, normal intervals, no acute ST or T wave changes.  CBC within normal limits. CMP within normal limits except for serum glucose elevated at 165. Troponin less than 0.02 with normal CK and CK-MB levels as well. INR 0.8. PTT 34.8.   ASSESSMENT AND PLAN: This is a 79 year old male with past medical history of hypertension, hyperlipidemia, type II diabetes mellitus, former tobacco abuse, chronic gastritis, gastroesophageal reflux disease, benign prostatic hypertrophy, RLS, with history of right posterior parietal CVA August 2012 with no residual deficits (multiple areas of ischemia were noted on recent MRI) here with left upper extremity and hand numbness, weakness, and tingling with: 1. Recurrent cerebrovascular accident versus transient ischemic attack. Recommend hospital admission for further evaluation and management. Will admit patient to telemetry for observation. Obtain frequent neuro checks. Schedule brain MRI. Will continue Plavix for now. The patient needs aggressive risk factor modification and adherence to recommended diet on which he was counseled and advised. Question would be whether to escalate therapy and add aspirin and Plavix and would not place the patient on anticoagulation at this time. He is currently not in atrial fibrillation on EKG. Will hold off on starting aspirin to Plavix at this time as this will be associated with increased bleeding risks and patient has a history of gastritis and  gastroesophageal reflux disease with recurrent (noncardiac) chest pain. Therefore, will continue Plavix alone for now in addition to statin therapy and he needs aggressive risk factor modification which was explained. The family is also interested in neurologic evaluation, therefore, we will obtain Neurology consultation at this time. Will not repeat 2-D echocardiogram or carotid Doppler's at this time as they were benign back in August 2012. Will not repeat echocardiogram or carotid Doppler's at this time as they were benign back in August 2012. Will check fasting lipid profile and hemoglobin A1c in a.m. Also, obtain PT and OT evaluation. Further work-up and management to follow depending on the patient's clinical course. Plan of care with further work-up and management were discussed with the patient and his wife, both of whom verbalized their agreement with all of the above.  2. History of hypertension. Will allow for some permissive hypertension in the acute setting of cerebrovascular accident versus transient ischemic attack. Hold blood pressure medications for now. Monitor closely. In the event of severe hypertension, will manage accordingly.  3. Hyperlipidemia. Continue statin therapy and place the patient on low fat, low cholesterol diet. Check fasting lipid profile in a.m.  4. Type II diabetes mellitus with serum blood glucose elevated at 165 and wife attributes this to the patient eating french fries. Will continue metformin for now and the patient may need to have his dose increased to b.i.d. or to 1000 mg but will defer this to the patient's primary care physician. Check hemoglobin A1c and provide ADA diet for now.   5. Gastroesophageal reflux disease, chronic gastritis. Would avoid  concomitant use of PPI with Plavix (the patient has not been taking Nexium over the past weeks and he states that he has not experienced any recent indigestion or chest pain.). Will start a trial of Zantac and monitor  clinical response.  6. DVT prophylaxis. Place SCDs and TEDs to bilateral lower extremities. The patient is also ambulatory.   CODE STATUS: FULL CODE.   TIME SPENT ON THIS ADMISSION: Approximately 60 minutes.   ____________________________ Romie Jumper, MD knl:drc D: 08/10/2011 09:57:33 ET T: 08/10/2011 10:31:42 ET JOB#: [id number]  cc: Romie Jumper, MD, <Dictator> Vianne Bulls. Arline Asp, MD Romie Jumper MD ELECTRONICALLY SIGNED 08/27/2011 3:25

## 2014-12-16 ENCOUNTER — Other Ambulatory Visit: Payer: Self-pay | Admitting: Neurosurgery

## 2014-12-25 ENCOUNTER — Encounter (HOSPITAL_COMMUNITY): Payer: Self-pay

## 2014-12-25 NOTE — Progress Notes (Signed)
Anesthesia Chart Review: Patient is a 79 year old male scheduled for L2-5 laminectomy on 01/04/15 by Dr. Arnoldo Morale.  History includes former smoker, DM2, CAD, dysrhythmia (afib), aortic atherosclerosis, HLD, HTN, CVA, PVD, hypothyroidism.  PCP is Dr. Derinda Late Jefm Bryant, see Care Everywhere) who cleared patient for this procedure with permission to hold Eliquis one week prior to surgery. He did not feel patient would require further preoperative cardiac work-up. Primary cardiologist is Dr. Serafina Royals. Jefm Bryant, see Care Everywhere). EP Cardiologist is Dr. Virl Axe, last visit 08/04/14.  He felt afib was relatively infrequent and because of patient's underlying SB suggested taking an extra metoprolol 25mg  Q 6 hours if recurrent persistent afib. There was no recommendation of PPM at that time.   Meds include Eliquis, doxycycline, Nexium, Imdur, levothyroxine, lovastatin, metformin, Lopressor, Flomax.   12/15/14 EKG (PCP): NSR.  03/23/14 Stress Echo (Care Everywhere): NORMAL STRESS ECHOCARDIOGRAM. Stress EF > 55%.   12/24/13 [facility]): Right dominant. 30% proximal LM. 30% proximal LAD. 20% mid LAD. 30% ostial CX. Small sized vessel, 70% OM2. 20% mid RCA. Normal LVF, EF 60%. Recommendations: Addition of medical management of CAD including Imdur.  Patient's PAT visit is scheduled for 12/28/14.  Currently available records reviewed with anesthesiologist Dr. Marcie Bal.  Patient has had recent cath and stress echo.  He was maintaining SR earlier this month.  If no acute changes and PAT labs, etc., are acceptable then would anticipate that he could proceed as planned.  George Hugh [facility]/Anesthesiology Phone [phone] 12/25/2014 3:23 PM

## 2014-12-28 ENCOUNTER — Encounter (HOSPITAL_COMMUNITY): Payer: Self-pay

## 2014-12-28 ENCOUNTER — Encounter (HOSPITAL_COMMUNITY)
Admission: RE | Admit: 2014-12-28 | Discharge: 2014-12-28 | Disposition: A | Discharge status: Home / Self Care | Payer: Commercial Managed Care - HMO | Source: Ambulatory Visit | Attending: Neurosurgery | Admitting: Neurosurgery

## 2014-12-28 DIAGNOSIS — Z79899 Other long term (current) drug therapy: Secondary | ICD-10-CM | POA: Diagnosis not present

## 2014-12-28 DIAGNOSIS — I4891 Unspecified atrial fibrillation: Secondary | ICD-10-CM | POA: Insufficient documentation

## 2014-12-28 DIAGNOSIS — Z01818 Encounter for other preprocedural examination: Secondary | ICD-10-CM | POA: Insufficient documentation

## 2014-12-28 DIAGNOSIS — Z7902 Long term (current) use of antithrombotics/antiplatelets: Secondary | ICD-10-CM | POA: Diagnosis not present

## 2014-12-28 DIAGNOSIS — E119 Type 2 diabetes mellitus without complications: Secondary | ICD-10-CM | POA: Diagnosis not present

## 2014-12-28 DIAGNOSIS — I739 Peripheral vascular disease, unspecified: Secondary | ICD-10-CM | POA: Diagnosis not present

## 2014-12-28 DIAGNOSIS — Z01812 Encounter for preprocedural laboratory examination: Secondary | ICD-10-CM | POA: Insufficient documentation

## 2014-12-28 DIAGNOSIS — I2510 Atherosclerotic heart disease of native coronary artery without angina pectoris: Secondary | ICD-10-CM | POA: Insufficient documentation

## 2014-12-28 DIAGNOSIS — E039 Hypothyroidism, unspecified: Secondary | ICD-10-CM | POA: Diagnosis not present

## 2014-12-28 DIAGNOSIS — Z87891 Personal history of nicotine dependence: Secondary | ICD-10-CM | POA: Diagnosis not present

## 2014-12-28 DIAGNOSIS — I700 Atherosclerosis of aorta: Secondary | ICD-10-CM | POA: Diagnosis not present

## 2014-12-28 DIAGNOSIS — I10 Essential (primary) hypertension: Secondary | ICD-10-CM | POA: Insufficient documentation

## 2014-12-28 DIAGNOSIS — Z8673 Personal history of transient ischemic attack (TIA), and cerebral infarction without residual deficits: Secondary | ICD-10-CM | POA: Insufficient documentation

## 2014-12-28 DIAGNOSIS — E785 Hyperlipidemia, unspecified: Secondary | ICD-10-CM | POA: Insufficient documentation

## 2014-12-28 HISTORY — DX: Rosacea, unspecified: L71.9

## 2014-12-28 HISTORY — DX: Pneumonia, unspecified organism: J18.9

## 2014-12-28 HISTORY — DX: Reserved for concepts with insufficient information to code with codable children: IMO0002

## 2014-12-28 HISTORY — DX: Systemic lupus erythematosus, unspecified: M32.9

## 2014-12-28 HISTORY — DX: Gastro-esophageal reflux disease without esophagitis: K21.9

## 2014-12-28 HISTORY — DX: Unspecified osteoarthritis, unspecified site: M19.90

## 2014-12-28 HISTORY — DX: Deviated nasal septum: J34.2

## 2014-12-28 HISTORY — DX: Unspecified hearing loss, unspecified ear: H91.90

## 2014-12-28 LAB — BASIC METABOLIC PANEL
ANION GAP: 6 (ref 5–15)
BUN: 20 mg/dL (ref 6–20)
CO2: 26 mmol/L (ref 22–32)
CREATININE: 0.74 mg/dL (ref 0.61–1.24)
Calcium: 10.4 mg/dL — ABNORMAL HIGH (ref 8.9–10.3)
Chloride: 104 mmol/L (ref 101–111)
GFR calc Af Amer: >60 mL/min (ref >60)
GFR calc non Af Amer: >60 mL/min (ref >60)
Glucose, Bld: 189 mg/dL — ABNORMAL HIGH (ref 65–99)
POTASSIUM: 4.8 mmol/L (ref 3.5–5.1)
Sodium: 136 mmol/L (ref 135–145)

## 2014-12-28 LAB — CBC
HEMATOCRIT: 40.7 % (ref 39.0–52.0)
HEMOGLOBIN: 14.8 g/dL (ref 13.0–17.0)
MCH: 31 pg (ref 26.0–34.0)
MCHC: 36.4 g/dL — ABNORMAL HIGH (ref 30.0–36.0)
MCV: 85.1 fL (ref 78.0–100.0)
Platelets: 169 10*3/uL (ref 150–400)
RBC: 4.78 MIL/uL (ref 4.22–5.81)
RDW: 12.8 % (ref 11.5–15.5)
WBC: 8.2 10*3/uL (ref 4.0–10.5)

## 2014-12-28 LAB — SURGICAL PCR SCREEN
MRSA, PCR: NEGATIVE
STAPHYLOCOCCUS AUREUS: NEGATIVE

## 2014-12-28 LAB — GLUCOSE, CAPILLARY: Glucose-Capillary: 240 mg/dL — ABNORMAL HIGH (ref 65–99)

## 2014-12-28 NOTE — Progress Notes (Signed)
Pt denies SOB and chest pain but is under the care of Dr. Virl Axe, cardiology. Pt stated that he was instructed to stop taking Eliquis and his last dose was yesterday, Sunday, 12/27/14.

## 2014-12-28 NOTE — Pre-Procedure Instructions (Signed)
Ernest [name]  12/28/2014      [id number], [place] [address] Phone: [phone] Fax: [phone]    Your procedure is scheduled on Monday, January 04, 2015  Report to [facility] Admitting at 6:00 A.M.  Call this number if you have problems the morning of surgery:  [phone]   Remember: Stop taking apixaban [name] as instructed by MD.  Do not eat food or drink liquids after midnight Sunday, January 03, 2015  Take these medicines the morning of surgery with A SIP OF WATER : esomeprazole ([place]), isosorbide mononitrate (IMDUR), levothyroxine (SYNTHROID, LEVOTHROID), metoprolol tartrate (LOPRESSOR), tamsulosin (FLOMAX)   DO NOT take any diabetic medications the morning of procedure such as metFORMIN (GLUCOPHAGE).   Stop taking Aspirin, Eliquis, vitamins and herbal medications. Do not take any NSAIDs ie: Ibuprofen, Advil, Naproxen or any medication containing Aspirin; stop 1 week prior to procedure   Do not wear jewelry, make-up or nail polish.  Do not wear lotions, powders, or perfumes.  You may not wear deodorant.  Do not shave 48 hours prior to surgery.  Men may shave face and neck.  Do not bring valuables to the hospital.  [facility] is not responsible for any belongings or valuables.  Contacts, dentures or bridgework may not be worn into surgery.  Leave your suitcase in the car.  After surgery it may be brought to your room.  For patients admitted to the hospital, discharge time will be determined by your treatment team.  Patients discharged the day of surgery will not be allowed to drive home.   Name and phone number of your driver:    Special instructions:  Special Instructions:Special Instructions: [facility] - Preparing for Surgery  Before surgery, you can play an important role.  Because skin is not sterile, your skin needs to be as free of germs as  possible.  You can reduce the number of germs on you skin by washing with CHG (chlorahexidine gluconate) soap before surgery.  CHG is an antiseptic cleaner which kills germs and bonds with the skin to continue killing germs even after washing.  Please DO NOT use if you have an allergy to CHG or antibacterial soaps.  If your skin becomes reddened/irritated stop using the CHG and inform your nurse when you arrive at Short Stay.  Do not shave (including legs and underarms) for at least 48 hours prior to the first CHG shower.  You may shave your face.  Please follow these instructions carefully:   1.  Shower with CHG Soap the night before surgery and the morning of Surgery.  2.  If you choose to wash your hair, wash your hair first as usual with your normal shampoo.  3.  After you shampoo, rinse your hair and body thoroughly to remove the Shampoo.  4.  Use CHG as you would any other liquid soap.  You can apply chg directly  to the skin and wash gently with scrungie or a clean washcloth.  5.  Apply the CHG Soap to your body ONLY FROM THE NECK DOWN.  Do not use on open wounds or open sores.  Avoid contact with your eyes, ears, mouth and genitals (private parts).  Wash genitals (private parts) with your normal soap.  6.  Wash thoroughly, paying special attention to the area where your surgery will be performed.  7.  Thoroughly rinse your body  with warm water from the neck down.  8.  DO NOT shower/wash with your normal soap after using and rinsing off the CHG Soap.  9.  Pat yourself dry with a clean towel.            10.  Wear clean pajamas.            11.  Place clean sheets on your bed the night of your first shower and do not sleep with pets.  Day of Surgery  Do not apply any lotions/deodorants the morning of surgery.  Please wear clean clothes to the hospital/surgery center.  Please read over the following fact sheets that you were given. Pain Booklet, Coughing and Deep Breathing, MRSA  Information and Surgical Site Infection Prevention

## 2014-12-28 NOTE — Progress Notes (Signed)
   12/28/14 1022  OBSTRUCTIVE SLEEP APNEA  Have you ever been diagnosed with sleep apnea through a sleep study? No  Do you snore loudly (loud enough to be heard through closed doors)?  1  Do you often feel tired, fatigued, or sleepy during the daytime? 1  Has anyone observed you stop breathing during your sleep? 0  Do you have, or are you being treated for high blood pressure? 1  BMI more than 35 kg/m2? 0  Age over 77 years old? 1  Neck circumference greater than 40 cm/16 inches? 0  Gender: 1  Obstructive Sleep Apnea Score 5

## 2014-12-29 LAB — HEMOGLOBIN A1C
HEMOGLOBIN A1C: 7.3 % — AB (ref 4.8–5.6)
Mean Plasma Glucose: 163 mg/dL

## 2015-01-04 ENCOUNTER — Inpatient Hospital Stay (HOSPITAL_COMMUNITY)
Admission: RE | Admit: 2015-01-04 | Discharge: 2015-01-05 | DRG: 517 | Disposition: A | Discharge status: Home / Self Care | Payer: Commercial Managed Care - HMO | Source: Ambulatory Visit | Attending: Neurosurgery | Admitting: Neurosurgery

## 2015-01-04 ENCOUNTER — Encounter (HOSPITAL_COMMUNITY): Payer: Self-pay | Admitting: General Practice

## 2015-01-04 ENCOUNTER — Encounter (HOSPITAL_COMMUNITY)
Admission: RE | Disposition: A | Discharge status: Home / Self Care | Payer: Self-pay | Source: Ambulatory Visit | Attending: Neurosurgery

## 2015-01-04 ENCOUNTER — Inpatient Hospital Stay (HOSPITAL_COMMUNITY): Payer: Commercial Managed Care - HMO

## 2015-01-04 ENCOUNTER — Inpatient Hospital Stay (HOSPITAL_COMMUNITY): Payer: Commercial Managed Care - HMO | Admitting: Vascular Surgery

## 2015-01-04 ENCOUNTER — Inpatient Hospital Stay (HOSPITAL_COMMUNITY): Payer: Commercial Managed Care - HMO | Admitting: Anesthesiology

## 2015-01-04 DIAGNOSIS — H9193 Unspecified hearing loss, bilateral: Secondary | ICD-10-CM | POA: Diagnosis present

## 2015-01-04 DIAGNOSIS — M48062 Spinal stenosis, lumbar region with neurogenic claudication: Secondary | ICD-10-CM | POA: Diagnosis present

## 2015-01-04 DIAGNOSIS — M5416 Radiculopathy, lumbar region: Secondary | ICD-10-CM | POA: Diagnosis present

## 2015-01-04 DIAGNOSIS — I10 Essential (primary) hypertension: Secondary | ICD-10-CM | POA: Diagnosis present

## 2015-01-04 DIAGNOSIS — Z8673 Personal history of transient ischemic attack (TIA), and cerebral infarction without residual deficits: Secondary | ICD-10-CM | POA: Diagnosis not present

## 2015-01-04 DIAGNOSIS — M549 Dorsalgia, unspecified: Secondary | ICD-10-CM | POA: Diagnosis present

## 2015-01-04 DIAGNOSIS — E119 Type 2 diabetes mellitus without complications: Secondary | ICD-10-CM | POA: Diagnosis present

## 2015-01-04 DIAGNOSIS — I739 Peripheral vascular disease, unspecified: Secondary | ICD-10-CM | POA: Diagnosis present

## 2015-01-04 DIAGNOSIS — M4316 Spondylolisthesis, lumbar region: Secondary | ICD-10-CM | POA: Diagnosis present

## 2015-01-04 DIAGNOSIS — R338 Other retention of urine: Secondary | ICD-10-CM | POA: Diagnosis not present

## 2015-01-04 DIAGNOSIS — E785 Hyperlipidemia, unspecified: Secondary | ICD-10-CM | POA: Diagnosis present

## 2015-01-04 DIAGNOSIS — I2510 Atherosclerotic heart disease of native coronary artery without angina pectoris: Secondary | ICD-10-CM | POA: Diagnosis present

## 2015-01-04 DIAGNOSIS — Z7901 Long term (current) use of anticoagulants: Secondary | ICD-10-CM

## 2015-01-04 DIAGNOSIS — M4806 Spinal stenosis, lumbar region: Secondary | ICD-10-CM | POA: Diagnosis present

## 2015-01-04 DIAGNOSIS — Z87891 Personal history of nicotine dependence: Secondary | ICD-10-CM | POA: Diagnosis not present

## 2015-01-04 DIAGNOSIS — Z419 Encounter for procedure for purposes other than remedying health state, unspecified: Secondary | ICD-10-CM

## 2015-01-04 HISTORY — PX: LUMBAR LAMINECTOMY/DECOMPRESSION MICRODISCECTOMY: SHX5026

## 2015-01-04 LAB — GLUCOSE, CAPILLARY
GLUCOSE-CAPILLARY: 163 mg/dL — AB (ref 65–99)
Glucose-Capillary: 130 mg/dL — ABNORMAL HIGH (ref 65–99)
Glucose-Capillary: 258 mg/dL — ABNORMAL HIGH (ref 65–99)
Glucose-Capillary: 289 mg/dL — ABNORMAL HIGH (ref 65–99)

## 2015-01-04 SURGERY — LUMBAR LAMINECTOMY/DECOMPRESSION MICRODISCECTOMY 3 LEVELS
Anesthesia: General

## 2015-01-04 MED ORDER — HYDROMORPHONE HCL 1 MG/ML IJ SOLN: 0.2500 mg | INTRAMUSCULAR | Status: DC | PRN

## 2015-01-04 MED ORDER — PHENYLEPHRINE HCL 10 MG/ML IJ SOLN
INTRAMUSCULAR | Status: DC | PRN
  Administered 2015-01-04 (×5): 80 ug via INTRAVENOUS

## 2015-01-04 MED ORDER — PRAVASTATIN SODIUM 40 MG PO TABS
40.0000 mg | ORAL_TABLET | Freq: Every day | ORAL | Status: DC
  Administered 2015-01-04: 40 mg via ORAL
  Filled 2015-01-04 (×2): qty 1

## 2015-01-04 MED ORDER — LACTATED RINGERS IV SOLN: INTRAVENOUS | Status: DC

## 2015-01-04 MED ORDER — CEFAZOLIN SODIUM-DEXTROSE 2-3 GM-% IV SOLR
2.0000 g | Freq: Three times a day (TID) | INTRAVENOUS | Status: AC
  Administered 2015-01-04 (×2): 2 g via INTRAVENOUS
  Filled 2015-01-04 (×2): qty 50

## 2015-01-04 MED ORDER — MORPHINE SULFATE 2 MG/ML IJ SOLN: 1.0000 mg | INTRAMUSCULAR | Status: DC | PRN

## 2015-01-04 MED ORDER — TAMSULOSIN HCL 0.4 MG PO CAPS
0.4000 mg | ORAL_CAPSULE | Freq: Every day | ORAL | Status: DC
  Administered 2015-01-05: 0.4 mg via ORAL
  Filled 2015-01-04 (×2): qty 1

## 2015-01-04 MED ORDER — SUCCINYLCHOLINE CHLORIDE 20 MG/ML IJ SOLN
INTRAMUSCULAR | Status: AC
  Filled 2015-01-04: qty 1

## 2015-01-04 MED ORDER — MIDAZOLAM HCL 2 MG/2ML IJ SOLN
INTRAMUSCULAR | Status: AC
  Filled 2015-01-04: qty 2

## 2015-01-04 MED ORDER — FENTANYL CITRATE (PF) 250 MCG/5ML IJ SOLN
INTRAMUSCULAR | Status: AC
  Filled 2015-01-04: qty 5

## 2015-01-04 MED ORDER — OXYCODONE-ACETAMINOPHEN 5-325 MG PO TABS: 1.0000 | ORAL_TABLET | ORAL | Status: DC | PRN

## 2015-01-04 MED ORDER — GLYCOPYRROLATE 0.2 MG/ML IJ SOLN
INTRAMUSCULAR | Status: DC | PRN
  Administered 2015-01-04: 0.4 mg via INTRAVENOUS

## 2015-01-04 MED ORDER — 0.9 % SODIUM CHLORIDE (POUR BTL) OPTIME
TOPICAL | Status: DC | PRN
  Administered 2015-01-04: 1000 mL

## 2015-01-04 MED ORDER — BACITRACIN ZINC 500 UNIT/GM EX OINT
TOPICAL_OINTMENT | CUTANEOUS | Status: DC | PRN
  Administered 2015-01-04: 1 via TOPICAL

## 2015-01-04 MED ORDER — METFORMIN HCL 500 MG PO TABS
500.0000 mg | ORAL_TABLET | Freq: Two times a day (BID) | ORAL | Status: DC
  Administered 2015-01-04 – 2015-01-05 (×2): 500 mg via ORAL
  Filled 2015-01-04 (×4): qty 1

## 2015-01-04 MED ORDER — PHENOL 1.4 % MT LIQD: 1.0000 | OROMUCOSAL | Status: DC | PRN

## 2015-01-04 MED ORDER — BISACODYL 10 MG RE SUPP: 10.0000 mg | Freq: Every day | RECTAL | Status: DC | PRN

## 2015-01-04 MED ORDER — OXYCODONE HCL 5 MG/5ML PO SOLN: 5.0000 mg | Freq: Once | ORAL | Status: DC | PRN

## 2015-01-04 MED ORDER — CEFAZOLIN SODIUM-DEXTROSE 2-3 GM-% IV SOLR
2.0000 g | INTRAVENOUS | Status: AC
  Administered 2015-01-04: 2 g via INTRAVENOUS

## 2015-01-04 MED ORDER — ROCURONIUM BROMIDE 100 MG/10ML IV SOLN
INTRAVENOUS | Status: DC | PRN
  Administered 2015-01-04: 50 mg via INTRAVENOUS

## 2015-01-04 MED ORDER — EPHEDRINE SULFATE 50 MG/ML IJ SOLN
INTRAMUSCULAR | Status: AC
  Filled 2015-01-04: qty 1

## 2015-01-04 MED ORDER — BUPIVACAINE-EPINEPHRINE (PF) 0.5% -1:200000 IJ SOLN
INTRAMUSCULAR | Status: DC | PRN
  Administered 2015-01-04: 10 mL via PERINEURAL

## 2015-01-04 MED ORDER — LIDOCAINE HCL (CARDIAC) 20 MG/ML IV SOLN
INTRAVENOUS | Status: AC
  Filled 2015-01-04: qty 5

## 2015-01-04 MED ORDER — BUPIVACAINE LIPOSOME 1.3 % IJ SUSP
INTRAMUSCULAR | Status: DC | PRN
  Administered 2015-01-04: 20 mL

## 2015-01-04 MED ORDER — EPHEDRINE SULFATE 50 MG/ML IJ SOLN
INTRAMUSCULAR | Status: DC | PRN
  Administered 2015-01-04 (×2): 10 mg via INTRAVENOUS
  Administered 2015-01-04: 5 mg via INTRAVENOUS

## 2015-01-04 MED ORDER — ONDANSETRON HCL 4 MG/2ML IJ SOLN
INTRAMUSCULAR | Status: AC
  Filled 2015-01-04: qty 2

## 2015-01-04 MED ORDER — DIAZEPAM 5 MG PO TABS
5.0000 mg | ORAL_TABLET | Freq: Four times a day (QID) | ORAL | Status: DC | PRN
  Administered 2015-01-04: 5 mg via ORAL
  Filled 2015-01-04: qty 1

## 2015-01-04 MED ORDER — PROPOFOL 10 MG/ML IV BOLUS
INTRAVENOUS | Status: AC
  Filled 2015-01-04: qty 20

## 2015-01-04 MED ORDER — PROPOFOL 10 MG/ML IV BOLUS
INTRAVENOUS | Status: DC | PRN
  Administered 2015-01-04: 30 mg via INTRAVENOUS
  Administered 2015-01-04: 110 mg via INTRAVENOUS

## 2015-01-04 MED ORDER — PHENYLEPHRINE HCL 10 MG/ML IJ SOLN
INTRAMUSCULAR | Status: AC
  Filled 2015-01-04: qty 1

## 2015-01-04 MED ORDER — OXYCODONE HCL 5 MG PO TABS: 5.0000 mg | ORAL_TABLET | Freq: Once | ORAL | Status: DC | PRN

## 2015-01-04 MED ORDER — PHENYLEPHRINE 40 MCG/ML (10ML) SYRINGE FOR IV PUSH (FOR BLOOD PRESSURE SUPPORT)
PREFILLED_SYRINGE | INTRAVENOUS | Status: AC
  Filled 2015-01-04: qty 10

## 2015-01-04 MED ORDER — MENTHOL 3 MG MT LOZG: 1.0000 | LOZENGE | OROMUCOSAL | Status: DC | PRN

## 2015-01-04 MED ORDER — INSULIN ASPART 100 UNIT/ML ~~LOC~~ SOLN: 0.0000 [IU] | Freq: Three times a day (TID) | SUBCUTANEOUS | Status: DC

## 2015-01-04 MED ORDER — LACTATED RINGERS IV SOLN
INTRAVENOUS | Status: DC | PRN
  Administered 2015-01-04 (×2): via INTRAVENOUS

## 2015-01-04 MED ORDER — DOCUSATE SODIUM 100 MG PO CAPS
100.0000 mg | ORAL_CAPSULE | Freq: Two times a day (BID) | ORAL | Status: DC
  Administered 2015-01-04 (×2): 100 mg via ORAL
  Filled 2015-01-04 (×2): qty 1

## 2015-01-04 MED ORDER — PHENYLEPHRINE HCL 10 MG/ML IJ SOLN
10.0000 mg | INTRAVENOUS | Status: DC | PRN
  Administered 2015-01-04: 50 ug/min via INTRAVENOUS

## 2015-01-04 MED ORDER — ONDANSETRON HCL 4 MG/2ML IJ SOLN
INTRAMUSCULAR | Status: DC | PRN
  Administered 2015-01-04: 4 mg via INTRAVENOUS

## 2015-01-04 MED ORDER — SODIUM CHLORIDE 0.9 % IJ SOLN
INTRAMUSCULAR | Status: AC
  Filled 2015-01-04: qty 10

## 2015-01-04 MED ORDER — METOPROLOL TARTRATE 25 MG PO TABS
25.0000 mg | ORAL_TABLET | Freq: Two times a day (BID) | ORAL | Status: DC
  Administered 2015-01-04: 25 mg via ORAL
  Filled 2015-01-04 (×3): qty 1

## 2015-01-04 MED ORDER — ACETAMINOPHEN 325 MG PO TABS: 650.0000 mg | ORAL_TABLET | ORAL | Status: DC | PRN

## 2015-01-04 MED ORDER — ROCURONIUM BROMIDE 50 MG/5ML IV SOLN
INTRAVENOUS | Status: AC
  Filled 2015-01-04: qty 1

## 2015-01-04 MED ORDER — FENTANYL CITRATE (PF) 100 MCG/2ML IJ SOLN
INTRAMUSCULAR | Status: DC | PRN
  Administered 2015-01-04 (×3): 50 ug via INTRAVENOUS
  Administered 2015-01-04: 100 ug via INTRAVENOUS

## 2015-01-04 MED ORDER — LEVOTHYROXINE SODIUM 112 MCG PO TABS
112.0000 ug | ORAL_TABLET | Freq: Every day | ORAL | Status: DC
  Administered 2015-01-05: 112 ug via ORAL
  Filled 2015-01-04 (×2): qty 1

## 2015-01-04 MED ORDER — ACETAMINOPHEN 650 MG RE SUPP: 650.0000 mg | RECTAL | Status: DC | PRN

## 2015-01-04 MED ORDER — HYDROCODONE-ACETAMINOPHEN 5-325 MG PO TABS
1.0000 | ORAL_TABLET | ORAL | Status: DC | PRN
  Administered 2015-01-04 – 2015-01-05 (×3): 2 via ORAL
  Filled 2015-01-04 (×3): qty 2

## 2015-01-04 MED ORDER — THROMBIN 5000 UNITS EX SOLR
CUTANEOUS | Status: DC | PRN
  Administered 2015-01-04 (×2): 5000 [IU] via TOPICAL

## 2015-01-04 MED ORDER — BACITRACIN 50000 UNITS IM SOLR
INTRAMUSCULAR | Status: DC | PRN
  Administered 2015-01-04: 500 mL

## 2015-01-04 MED ORDER — ISOSORBIDE MONONITRATE ER 30 MG PO TB24
30.0000 mg | ORAL_TABLET | Freq: Every day | ORAL | Status: DC
  Filled 2015-01-04: qty 1

## 2015-01-04 MED ORDER — DEXAMETHASONE SODIUM PHOSPHATE 4 MG/ML IJ SOLN
INTRAMUSCULAR | Status: DC | PRN
  Administered 2015-01-04: 8 mg via INTRAVENOUS

## 2015-01-04 MED ORDER — ALUM & MAG HYDROXIDE-SIMETH 200-200-20 MG/5ML PO SUSP: 30.0000 mL | Freq: Four times a day (QID) | ORAL | Status: DC | PRN

## 2015-01-04 MED ORDER — BUPIVACAINE LIPOSOME 1.3 % IJ SUSP
20.0000 mL | Freq: Once | INTRAMUSCULAR | Status: DC
  Filled 2015-01-04: qty 20

## 2015-01-04 MED ORDER — NEOSTIGMINE METHYLSULFATE 10 MG/10ML IV SOLN
INTRAVENOUS | Status: DC | PRN
  Administered 2015-01-04: 3 mg via INTRAVENOUS

## 2015-01-04 MED ORDER — PANTOPRAZOLE SODIUM 40 MG PO TBEC
40.0000 mg | DELAYED_RELEASE_TABLET | Freq: Every day | ORAL | Status: DC
  Administered 2015-01-04: 40 mg via ORAL
  Filled 2015-01-04: qty 1

## 2015-01-04 MED ORDER — HEMOSTATIC AGENTS (NO CHARGE) OPTIME
TOPICAL | Status: DC | PRN
  Administered 2015-01-04: 1 via TOPICAL

## 2015-01-04 MED ORDER — ACETAMINOPHEN 10 MG/ML IV SOLN
INTRAVENOUS | Status: AC
  Administered 2015-01-04: 1000 mg via INTRAVENOUS
  Filled 2015-01-04: qty 100

## 2015-01-04 MED ORDER — INSULIN ASPART 100 UNIT/ML ~~LOC~~ SOLN
0.0000 [IU] | Freq: Every day | SUBCUTANEOUS | Status: DC
  Administered 2015-01-04: 3 [IU] via SUBCUTANEOUS

## 2015-01-04 MED ORDER — ONDANSETRON HCL 4 MG/2ML IJ SOLN: 4.0000 mg | INTRAMUSCULAR | Status: DC | PRN

## 2015-01-04 MED ORDER — LIDOCAINE HCL (CARDIAC) 20 MG/ML IV SOLN
INTRAVENOUS | Status: DC | PRN
  Administered 2015-01-04: 70 mg via INTRAVENOUS

## 2015-01-04 SURGICAL SUPPLY — 46 items
BAG DECANTER FOR FLEXI CONT (MISCELLANEOUS) ×2 IMPLANT
BENZOIN TINCTURE PRP APPL 2/3 (GAUZE/BANDAGES/DRESSINGS) ×2 IMPLANT
BLADE CLIPPER SURG (BLADE) IMPLANT
BRUSH SCRUB EZ PLAIN DRY (MISCELLANEOUS) ×2 IMPLANT
BUR MATCHSTICK NEURO 3.0 LAGG (BURR) ×2 IMPLANT
BUR PRECISION FLUTE 6.0 (BURR) ×2 IMPLANT
CANISTER SUCT 3000ML PPV (MISCELLANEOUS) ×2 IMPLANT
CONT SPEC 4OZ CLIKSEAL STRL BL (MISCELLANEOUS) ×2 IMPLANT
DRAPE LAPAROTOMY 100X72X124 (DRAPES) ×2 IMPLANT
DRAPE MICROSCOPE LEICA (MISCELLANEOUS) ×2 IMPLANT
DRAPE POUCH INSTRU U-SHP 10X18 (DRAPES) ×2 IMPLANT
DRAPE SURG 17X23 STRL (DRAPES) ×8 IMPLANT
ELECT BLADE 4.0 EZ CLEAN MEGAD (MISCELLANEOUS) ×2
ELECT REM PT RETURN 9FT ADLT (ELECTROSURGICAL) ×2
ELECTRODE BLDE 4.0 EZ CLN MEGD (MISCELLANEOUS) ×1 IMPLANT
ELECTRODE REM PT RTRN 9FT ADLT (ELECTROSURGICAL) ×1 IMPLANT
EVACUATOR 1/8 PVC DRAIN (DRAIN) ×2 IMPLANT
GAUZE SPONGE 4X4 12PLY STRL (GAUZE/BANDAGES/DRESSINGS) ×2 IMPLANT
GAUZE SPONGE 4X4 16PLY XRAY LF (GAUZE/BANDAGES/DRESSINGS) IMPLANT
GLOVE BIO SURGEON STRL SZ8 (GLOVE) ×2 IMPLANT
GLOVE BIO SURGEON STRL SZ8.5 (GLOVE) ×2 IMPLANT
GOWN STRL REUS W/ TWL LRG LVL3 (GOWN DISPOSABLE) ×1 IMPLANT
GOWN STRL REUS W/ TWL XL LVL3 (GOWN DISPOSABLE) ×2 IMPLANT
GOWN STRL REUS W/TWL 2XL LVL3 (GOWN DISPOSABLE) IMPLANT
GOWN STRL REUS W/TWL LRG LVL3 (GOWN DISPOSABLE) ×1
GOWN STRL REUS W/TWL XL LVL3 (GOWN DISPOSABLE) ×2
KIT BASIN OR (CUSTOM PROCEDURE TRAY) ×2 IMPLANT
KIT ROOM TURNOVER OR (KITS) ×2 IMPLANT
NEEDLE HYPO 21X1.5 SAFETY (NEEDLE) IMPLANT
NEEDLE HYPO 22GX1.5 SAFETY (NEEDLE) IMPLANT
NS IRRIG 1000ML POUR BTL (IV SOLUTION) ×2 IMPLANT
PACK LAMINECTOMY NEURO (CUSTOM PROCEDURE TRAY) ×2 IMPLANT
PAD ARMBOARD 7.5X6 YLW CONV (MISCELLANEOUS) ×6 IMPLANT
PATTIES SURGICAL .5 X1 (DISPOSABLE) IMPLANT
RUBBERBAND STERILE (MISCELLANEOUS) ×4 IMPLANT
SPONGE SURGIFOAM ABS GEL SZ50 (HEMOSTASIS) ×2 IMPLANT
STRIP CLOSURE SKIN 1/2X4 (GAUZE/BANDAGES/DRESSINGS) ×2 IMPLANT
SUT VIC AB 1 CT1 18XBRD ANBCTR (SUTURE) ×2 IMPLANT
SUT VIC AB 1 CT1 8-18 (SUTURE) ×2
SUT VIC AB 2-0 CP2 18 (SUTURE) ×4 IMPLANT
SYR 20CC LL (SYRINGE) IMPLANT
SYR 20ML ECCENTRIC (SYRINGE) IMPLANT
TAPE CLOTH SURG 4X10 WHT LF (GAUZE/BANDAGES/DRESSINGS) ×2 IMPLANT
TOWEL OR 17X24 6PK STRL BLUE (TOWEL DISPOSABLE) ×2 IMPLANT
TOWEL OR 17X26 10 PK STRL BLUE (TOWEL DISPOSABLE) ×2 IMPLANT
WATER STERILE IRR 1000ML POUR (IV SOLUTION) ×2 IMPLANT

## 2015-01-04 NOTE — H&P (Signed)
Subjective: The patient is a 79 year old white Ernest [name] who has complained of back, buttock, and leg pain consistent with neurogenic claudication. He has failed medical management and was worked up with a lumbar MRI. This demonstrated spinal stenosis at L2-3, L3-4 and L4-5. I discussed the situation with the patient and reviewed his imaging studies with him. We discussed the various treatment options. He has decided to proceed with surgery .  Past Medical History  Diagnosis Date  . Thyroid disease   . Hyperlipidemia   . BPH (benign prostatic hypertrophy)   . Diabetes mellitus without complication   . Hypertension   . Aortic atherosclerosis   . PVD (peripheral vascular disease)   . Stroke   . A-fib   . Arrhythmia   . Coronary artery disease     Dr. Serafina Royals  . GERD (gastroesophageal reflux disease)   . Pneumonia     as a child  . Arthritis   . Rosacea   . Lupus     discoid of scalp only  . Deviated nasal septum   . Hearing deficit     wears hearing aids    Past Surgical History  Procedure Laterality Date  . Colonoscopy    . Prostate biopsy    . Cardiac catheterization      [facility] 12/24/13: 30% LM , pLAD, ostial CX. Ernest% mLAD, mRCA. 70% OM2 (small vessel). Med RX.   Marland Kitchen Cardiac catheterization      [facility]  . Cardiac catheterization      [facility]  . Cataract extraction w/ intraocular lens implant      right  . Multiple tooth extractions      No Known Allergies  History  Substance Use Topics  . Smoking status: Former Smoker    Types: Cigarettes  . Smokeless tobacco: Never Used     Comment: " Quit smoking cigarettes in 1983 "  . Alcohol Use: No    Family History  Problem Relation Age of Onset  . Heart attack Mother   . Heart attack Father   . Cancer - Lung Sister   . Cancer - Lung Brother   . Cancer - Lung Brother   . Cancer Brother   . Diabetes Sister    Prior to Admission medications   Medication Sig Start Date End Date Taking? Authorizing Provider  acetaminophen  (TYLENOL) 325 MG tablet Take 650 mg by mouth every 6 (six) hours as needed (pain).   Yes Historical Provider, MD  apixaban (ELIQUIS) 5 MG TABS tablet Take 5 mg by mouth 2 (two) times daily.  02/25/14  Yes Historical Provider, MD  doxycycline (VIBRAMYCIN) 100 MG capsule Take 100 mg by mouth daily.   Yes Historical Provider, MD  esomeprazole (NEXIUM) Ernest MG capsule Take Ernest mg by mouth daily.    Yes Historical Provider, MD  isosorbide mononitrate (IMDUR) 30 MG 24 hr tablet Take 30 mg by mouth daily.  11/06/13  Yes Historical Provider, MD  levothyroxine (SYNTHROID, LEVOTHROID) 112 MCG tablet Take 112 mcg by mouth daily before breakfast.   Yes Historical Provider, MD  lovastatin (MEVACOR) 40 MG tablet Take 40 mg by mouth at bedtime.    Yes Historical Provider, MD  metFORMIN (GLUCOPHAGE) 500 MG tablet Take 500 mg by mouth 2 (two) times daily with a meal.    Yes Historical Provider, MD  metoprolol tartrate (LOPRESSOR) 25 MG tablet Take 25 mg by mouth 2 (two) times daily.   Yes Historical Provider, MD  metronidazole (NORITATE) 1 %  cream Apply 1 application topically daily.   Yes Historical Provider, MD  Pediatric Multiple Vit-C-FA (CHEWABLE VITE CHILDRENS) CHEW Chew by mouth daily.    Yes Historical Provider, MD  tamsulosin (FLOMAX) 0.4 MG CAPS capsule Take 0.4 mg by mouth daily.    Yes Historical Provider, MD     Review of Systems  Positive ROS: As above  All other systems have been reviewed and were otherwise negative with the exception of those mentioned in the HPI and as above.  Objective: Vital signs in last 24 hours: Temp:  [97.8 F (36.6 C)] 97.8 F (36.6 C) (06/27 0640) Pulse Rate:  [54] 54 (06/27 0628) Resp:  [Ernest] Ernest (06/27 0628) BP: (140)/(79) 140/79 mmHg (06/27 0628) SpO2:  [99 %] 99 % (06/27 0628) Weight:  [76.93 kg (169 lb 9.6 oz)] 76.93 kg (169 lb 9.6 oz) (06/27 0628)  General Appearance: Alert, cooperative, no distress, Head: Normocephalic, without obvious abnormality,  atraumatic Eyes: PERRL, conjunctiva/corneas clear, EOM's intact,    Ears: Normal  Throat: Normal  Neck: Supple, symmetrical, trachea midline, no adenopathy; thyroid: No enlargement/tenderness/nodules; no carotid bruit or JVD Back: Symmetric, no curvature, ROM normal, no CVA tenderness Lungs: Clear to auscultation bilaterally, respirations unlabored Heart: Regular rate and rhythm, no murmur, rub or gallop Abdomen: Soft, non-tender,, no masses, no organomegaly Extremities: Extremities normal, atraumatic, no cyanosis or edema Pulses: 2+ and symmetric all extremities Skin: Skin color, texture, turgor normal, no rashes or lesions  NEUROLOGIC:   Mental status: alert and oriented, no aphasia, good attention span, Fund of knowledge/ memory ok Motor Exam - grossly normal Sensory Exam - grossly normal Reflexes:  Coordination - grossly normal Gait - grossly normal Balance - grossly normal Cranial Nerves: I: smell Not tested  II: visual acuity  OS: Normal  OD: Normal   II: visual fields Full to confrontation  II: pupils Equal, round, reactive to light  III,VII: ptosis None  III,IV,VI: extraocular muscles  Full ROM  V: mastication Normal  V: facial light touch sensation  Normal  V,VII: corneal reflex  Present  VII: facial muscle function - upper  Normal  VII: facial muscle function - lower Normal  VIII: hearing Not tested  IX: soft palate elevation  Normal  IX,X: gag reflex Present  XI: trapezius strength  5/5  XI: sternocleidomastoid strength 5/5  XI: neck flexion strength  5/5  XII: tongue strength  Normal    Data Review Lab Results  Component Value Date   WBC 8.2 06/Ernest/2016   HGB 14.8 06/Ernest/2016   HCT 40.7 06/Ernest/2016   MCV 85.1 06/Ernest/2016   PLT 169 06/Ernest/2016   Lab Results  Component Value Date   NA 136 06/Ernest/2016   K 4.8 06/Ernest/2016   CL 104 06/Ernest/2016   CO2 26 06/Ernest/2016   BUN Ernest 06/Ernest/2016   CREATININE 0.74 06/Ernest/2016   GLUCOSE 189* 06/Ernest/2016   Lab Results   Component Value Date   INR 0.8 08/10/2011    Assessment/Plan: L2-3, L3-4 and L4-5 spinal stenosis, lumbago, lumbar radiculopathy, neurogenic claudication: I have discussed the situation with the patient and reviewed his imaging studies with him. We have discussed the various treatment options including surgery. I have described the surgical treatment option of an L2-3, L3-4 and L4-5 laminectomy. I have shown him surgical models. We have discussed the risks, benefits, alternatives, and likelihood of achieving our goals with surgery. I have answered all the patient's questions. He has decided to proceed with surgery.   Harshika Mago D 01/04/2015 7:15 AM

## 2015-01-04 NOTE — Op Note (Signed)
Brief history: The patient is a 79 year old white male who has complained of back, buttock, and leg pain consistent with neurogenic claudication. He has failed medical management and was worked up with lumbar x-rays and a lumbar MRI. This demonstrated multilevel lumbar spinal stenosis. I discussed the situation with the patient and his wife. Discussed the various treatment options include surgery. He has decided proceed with a lumbar laminectomy after weighing the risks, benefits, and alternatives surgery.  Preoperative diagnosis: L2-3, L3-4 and L4-5 spinal stenosis, lumbago, lumbar radiculopathy, L2-3 spondylolisthesis  Postoperative diagnosis: Same  Procedure: L3 and L4 laminectomy with bilateral L2 laminotomies to decompress the bilateral L2, L3, L4 and L5 nerve roots  using micro-dissection  Surgeon: Dr. Earle Gell  Asst.: Dr. Kristeen Miss  Anesthesia: Gen. endotracheal  Estimated blood loss: 125 mL  Drains: One medium Hemovac in the epidural space.  Complications: None  Description of procedure: The patient was brought to the operating room by the anesthesia team. General endotracheal anesthesia was induced. The patient was turned to the prone position on the Wilson frame. The patient's lumbosacral region was then prepared with Betadine scrub and Betadine solution. Sterile drapes were applied.  I then injected the area to be incised with Marcaine with epinephrine solution. I then used a scalpel to make a linear midline incision over the L2-3, L3-4 and L4-5 intervertebral disc space. I then used electrocautery to perform a bilateral  subperiosteal dissection exposing the spinous process and lamina of L2, L3 and L4. We obtained intraoperative radiograph to confirm our location. I then inserted the [facility] retractor for exposure. I incised the interspinous ligament at L2-3, L3-4 and L4-5. I used the [place] run sure to remove the spinous process of L3 and L4 and the caudal aspect of the  L2 spinous process.  We then brought the operative microscope into the field. Under its magnification and illumination we completed the microdissection. I used a high-speed drill to perform a laminotomy at L2, L3 and L4 bilaterally. I then used a Kerrison punches to complete the laminectomy at L3 and L4 and to widen the bilateral laminotomies at L2 and removed the ligamentum flavum at L2-3, L3-4 and L4-5. I also removed the cephalad aspect of the L5 lamina.. We then used microdissection to free up the thecal sac and the bilateral L2, L3, L4 and L5 nerve root from the epidural tissue. I then used a Kerrison punch to perform a foraminotomy at about the bilateral L2, L3, L4 and L5 nerve root. We inspected the intervertebral disc at L2-3, L3-4 and L4-5. There were no significant herniations. I then palpated along the ventral surface of the thecal sac and along exit route of the bilateral L2, L3, L4 and L5 nerve root and noted that the neural structures were well decompressed. This completed the decompression.  We then obtained hemostasis using bipolar electrocautery. We irrigated the wound out with bacitracin solution. We then removed the retractor. I placed a medium Hemovac drain in the epidural space and tunneled it out through a separate stab wound. We then reapproximated the patient's thoracolumbar fascia with interrupted #1 Vicryl suture. We then reapproximated the patient's subcutaneous tissue with interrupted 2-0 Vicryl suture. We then reapproximated patient's skin with Steri-Strips and benzoin. The was then coated with bacitracin ointment. The drapes were removed. The patient was subsequently returned to the supine position where they were extubated by the anesthesia team. The patient was then transported to the postanesthesia care unit in stable condition. All sponge instrument and  needle counts were reportedly correct at the end of this case.

## 2015-01-04 NOTE — Progress Notes (Signed)
Patient ID: Ernest [name], male   DOB: 1935-11-21, 79 y.o.   MRN: [id number] Subjective:  The patient is somnolent but easily arousable. He is in no apparent distress. He looks well.  Objective: Vital signs in last 24 hours: Temp:  [97.8 F (36.6 C)] 97.8 F (36.6 C) (06/27 1020) Pulse Rate:  [52-64] 52 (06/27 1038) Resp:  [14-33] 14 (06/27 1038) BP: (127-140)/(56-79) 127/64 mmHg (06/27 1038) SpO2:  [95 %-99 %] 95 % (06/27 1038) Weight:  [76.93 kg (169 lb 9.6 oz)] 76.93 kg (169 lb 9.6 oz) (06/27 0628)  Intake/Output from previous day:   Intake/Output this shift: Total I/O In: 1900 [I.V.:1900] Out: -   Physical exam the patient is somnolent but arousable. He is moving his lower extremities well.  Lab Results: No results for input(s): WBC, HGB, HCT, PLT in the last 72 hours. BMET No results for input(s): NA, K, CL, CO2, GLUCOSE, BUN, CREATININE, CALCIUM in the last 72 hours.  Studies/Results: No results found.  Assessment/Plan: Patient is doing well. I spoke with his family.  LOS: 0 days     Cortasia Screws D 01/04/2015, 10:48 AM

## 2015-01-04 NOTE — Care Management (Signed)
Utilization review completed by Breon Rehm N. Roshawn Ayala, RN BSN 

## 2015-01-04 NOTE — Transfer of Care (Signed)
Immediate Anesthesia Transfer of Care Note  Patient: Ernest [name]  Procedure(s) Performed: Procedure(s) with comments: LUMBAR LAMINECTOMY/DECOMPRESSION MICRODISCECTOMY 3 LEVELS (N/A) - L23 L34 L45 laminectomies and foraminotomies  Patient Location: PACU  Anesthesia Type:General  Level of Consciousness: awake, alert , oriented and patient cooperative  Airway & Oxygen Therapy: Patient Spontanous Breathing  Post-op Assessment: Report given to RN, Post -op Vital signs reviewed and stable and Patient moving all extremities X 4  Post vital signs: Reviewed and stable  Last Vitals:  Filed Vitals:   01/04/15 0640  BP:   Pulse:   Temp: 36.6 C  Resp:     Complications: No apparent anesthesia complications

## 2015-01-04 NOTE — Anesthesia Postprocedure Evaluation (Signed)
  Anesthesia Post-op Note  Patient: Ernest [name]  Procedure(s) Performed: Procedure(s) with comments: LUMBAR LAMINECTOMY/DECOMPRESSION MICRODISCECTOMY 3 LEVELS (N/A) - L23 L34 L45 laminectomies and foraminotomies  Patient Location: PACU  Anesthesia Type:General  Level of Consciousness: awake  Airway and Oxygen Therapy: Patient Spontanous Breathing  Post-op Pain: mild  Post-op Assessment: Post-op Vital signs reviewed, Patient's Cardiovascular Status Stable, Respiratory Function Stable, Patent Airway, No signs of Nausea or vomiting and Pain level controlled   LLE Sensation: Full sensation   RLE Sensation: Full sensation      Post-op Vital Signs: Reviewed and stable  Last Vitals:  Filed Vitals:   01/04/15 1208  BP: 123/67  Pulse: 56  Temp:   Resp: 18    Complications: No apparent anesthesia complications

## 2015-01-04 NOTE — Anesthesia Preprocedure Evaluation (Addendum)
Anesthesia Evaluation  Patient identified by MRN, date of birth, ID band Patient awake    Reviewed: Allergy & Precautions, NPO status , Patient's Chart, lab work & pertinent test results, reviewed documented beta blocker date and time   History of Anesthesia Complications Negative for: history of anesthetic complications  Airway Mallampati: II  TM Distance: >3 FB Neck ROM: Full    Dental  (+) Edentulous Upper, Edentulous Lower   Pulmonary neg shortness of breath, neg sleep apnea, neg COPDneg recent URI, former smoker,  breath sounds clear to auscultation        Cardiovascular hypertension, Pt. on medications and Pt. on home beta blockers - angina+ CAD and + Peripheral Vascular Disease + dysrhythmias Atrial Fibrillation Rhythm:Regular     Neuro/Psych Low back pain, previous stroke with numbness in left hand  Neuromuscular disease CVA, Residual Symptoms negative psych ROS   GI/Hepatic Neg liver ROS, GERD-  Medicated and Controlled,  Endo/Other  diabetes, Type 2, Oral Hypoglycemic AgentsHypothyroidism   Renal/GU negative Renal ROS     Musculoskeletal  (+) Arthritis -,   Abdominal   Peds  Hematology negative hematology ROS (+)   Anesthesia Other Findings   Reproductive/Obstetrics                            Anesthesia Physical Anesthesia Plan  ASA: III  Anesthesia Plan: General   Post-op Pain Management:    Induction: Intravenous  Airway Management Planned: Oral ETT  Additional Equipment: None  Intra-op Plan:   Post-operative Plan: Extubation in OR  Informed Consent: I have reviewed the patients History and Physical, chart, labs and discussed the procedure including the risks, benefits and alternatives for the proposed anesthesia with the patient or authorized representative who has indicated his/her understanding and acceptance.     Plan Discussed with: CRNA and  Surgeon  Anesthesia Plan Comments:         Anesthesia Quick Evaluation

## 2015-01-04 NOTE — Anesthesia Procedure Notes (Signed)
Procedure Name: Intubation Date/Time: 01/04/2015 7:32 AM Performed by: Rebekah Chesterfield L Pre-anesthesia Checklist: Patient identified, Emergency Drugs available, Suction available, Patient being monitored and Timeout performed Patient Re-evaluated:Patient Re-evaluated prior to inductionOxygen Delivery Method: Circle system utilized Preoxygenation: Pre-oxygenation with 100% oxygen Intubation Type: IV induction Ventilation: Mask ventilation without difficulty Laryngoscope Size: Mac and 3 Grade View: Grade I Tube type: Oral Tube size: 7.5 mm Number of attempts: 1 Airway Equipment and Method: Stylet Placement Confirmation: ETT inserted through vocal cords under direct vision,  positive ETCO2 and breath sounds checked- equal and bilateral Secured at: 20 cm Tube secured with: Tape Dental Injury: Teeth and Oropharynx as per pre-operative assessment

## 2015-01-05 ENCOUNTER — Encounter (HOSPITAL_COMMUNITY): Payer: Self-pay | Admitting: Neurosurgery

## 2015-01-05 LAB — GLUCOSE, CAPILLARY: GLUCOSE-CAPILLARY: 176 mg/dL — AB (ref 65–99)

## 2015-01-05 MED ORDER — CYCLOBENZAPRINE HCL 10 MG PO TABS: 10.0000 mg | ORAL_TABLET | Freq: Three times a day (TID) | ORAL | Status: DC | PRN

## 2015-01-05 MED ORDER — TAMSULOSIN HCL 0.4 MG PO CAPS: 0.4000 mg | ORAL_CAPSULE | Freq: Every day | ORAL | Status: DC

## 2015-01-05 MED ORDER — OXYCODONE-ACETAMINOPHEN 5-325 MG PO TABS: 1.0000 | ORAL_TABLET | ORAL | Status: DC | PRN

## 2015-01-05 MED ORDER — DOCUSATE SODIUM 100 MG PO CAPS: 100.0000 mg | ORAL_CAPSULE | Freq: Two times a day (BID) | ORAL | Status: DC

## 2015-01-05 NOTE — Progress Notes (Signed)
I forgot to mention the patient was instructed to resumeEliquis on 01/07/2015.

## 2015-01-05 NOTE — Progress Notes (Signed)
Pt voided without difficulty, PVR was 231. Holli Humbles, RN

## 2015-01-05 NOTE — Discharge Summary (Signed)
Physician Discharge Summary  Patient ID: Ernest [name] MRN: [id number] DOB/AGE: Dec 01, 1935 79 y.o.  Admit date: 01/04/2015 Discharge date: 01/05/2015  Admission Diagnoses: L2-3, L3-4 and L4-5 spinal stenosis, lumbago, lumbar radiculopathy, neurogenic claudication, urinary retention  Discharge Diagnoses: The same Active Problems:   Lumbar stenosis with neurogenic claudication   Discharged Condition: good  Hospital Course: I performed an L2-3, L3-4 and L4-5 laminectomy on the patient on 01/04/2015. The surgery went well.  The patient's postoperative course was remarkable for only urinary retention. This seemed to resolve on postop day #1. The patient requested discharge to home. The patient and his wife were given oral and written discharge instructions. All their questions were answered.  The plan is to check his postvoid residual and if it's less than 300 mL, discharge him to home.  Consults: None Significant Diagnostic Studies: None Treatments: L2-3, L3-4 and L4-5 laminectomy using microdissection Discharge Exam: Blood pressure 160/81, pulse 61, temperature 97.8 F (36.6 C), temperature source Oral, resp. rate 20, height 5\' 7"  (1.702 m), weight 76.93 kg (169 lb 9.6 oz), SpO2 98 %. The patient is alert and pleasant. His strength is grossly normal his lower extremities. He looks well.  Disposition: Home     Medication List    STOP taking these medications        acetaminophen 325 MG tablet  Commonly known as:  TYLENOL      TAKE these medications        CHEWABLE VITE CHILDRENS Chew  Chew by mouth daily.     cyclobenzaprine 10 MG tablet  Commonly known as:  FLEXERIL  Take 1 tablet (10 mg total) by mouth 3 (three) times daily as needed for muscle spasms.     docusate sodium 100 MG capsule  Commonly known as:  COLACE  Take 1 capsule (100 mg total) by mouth 2 (two) times daily.     doxycycline 100 MG capsule  Commonly known as:  VIBRAMYCIN  Take 100 mg by mouth  daily.     ELIQUIS 5 MG Tabs tablet  Generic drug:  apixaban  Take 5 mg by mouth 2 (two) times daily.     esomeprazole 20 MG capsule  Commonly known as:  NEXIUM  Take 20 mg by mouth daily.     isosorbide mononitrate 30 MG 24 hr tablet  Commonly known as:  IMDUR  Take 30 mg by mouth daily.     levothyroxine 112 MCG tablet  Commonly known as:  SYNTHROID, LEVOTHROID  Take 112 mcg by mouth daily before breakfast.     lovastatin 40 MG tablet  Commonly known as:  MEVACOR  Take 40 mg by mouth at bedtime.     metFORMIN 500 MG tablet  Commonly known as:  GLUCOPHAGE  Take 500 mg by mouth 2 (two) times daily with a meal.     metoprolol tartrate 25 MG tablet  Commonly known as:  LOPRESSOR  Take 25 mg by mouth 2 (two) times daily.     metronidazole 1 % cream  Commonly known as:  NORITATE  Apply 1 application topically daily.     oxyCODONE-acetaminophen 5-325 MG per tablet  Commonly known as:  PERCOCET/ROXICET  Take 1-2 tablets by mouth every 4 (four) hours as needed for moderate pain.     tamsulosin 0.4 MG Caps capsule  Commonly known as:  FLOMAX  Take 0.4 mg by mouth daily.     tamsulosin 0.4 MG Caps capsule  Commonly known as:  FLOMAX  Take 1  capsule (0.4 mg total) by mouth daily.         SignedOphelia Charter 01/05/2015, 7:39 AM

## 2015-01-05 NOTE — Evaluation (Signed)
Physical Therapy Evaluation Patient Details Name: Ernest [name] MRN: [id number] DOB: 03/07/1936 Today's Date: 01/05/2015   History of Present Illness  Pt is a 79 y/o male who presents on 6/27 after elective L3-4 laminectomy with bilateral L2 laminotomies to decompress the bilateral L2-L5 nerve roots using micro-dissection.   Clinical Impression  Pt admitted with above diagnosis. Pt currently with functional limitations due to the deficits listed below (see PT Problem List). At the time of PT eval pt was able to perform transfers and ambulation with min guard to supervision for safety. Wife present and assists with cueing pt as he admits to [facility] deficits. Pt will benefit from skilled PT to increase their independence and safety with mobility to allow discharge to the venue listed below.    Follow Up Recommendations Outpatient PT;Supervision for mobility/OOB    Equipment Recommendations  None recommended by PT    Recommendations for Other Services       Precautions / Restrictions Precautions Precautions: Fall;Back Precaution Booklet Issued: Yes (comment) Precaution Comments: Pt has difficulty recalling back precautions. Requires cueing and teach back. Restrictions Weight Bearing Restrictions: No      Mobility  Bed Mobility Overal bed mobility: Needs Assistance Bed Mobility: Rolling;Sidelying to Sit Rolling: Supervision Sidelying to sit: Supervision       General bed mobility comments: VC's for sequencing and technique to complete logroll properly. No physical assist required.   Transfers Overall transfer level: Needs assistance Equipment used: None Transfers: Sit to/from Stand Sit to Stand: Min guard         General transfer comment: Close guard for safety as pt powers-up to full standing. Demonstrated safe hand placement upon stand>sit.   Ambulation/Gait Ambulation/Gait assistance: Min guard;Supervision Ambulation Distance (Feet): 250 Feet Assistive device:  None Gait Pattern/deviations: Step-through pattern;Decreased stride length;Narrow base of support Gait velocity: Decreased Gait velocity interpretation: Below normal speed for age/gender General Gait Details: Min guard initially progressing to supervision level. Pt appears stiff in LE's however no unsteadiness or LOB noted.   Stairs            Wheelchair Mobility    Modified Rankin (Stroke Patients Only)       Balance Overall balance assessment: Needs assistance Sitting-balance support: Feet supported;No upper extremity supported Sitting balance-Leahy Scale: Fair     Standing balance support: No upper extremity supported Standing balance-Leahy Scale: Fair                               Pertinent Vitals/Pain Pain Assessment: 0-10 Pain Score: 3  Pain Location: Incisional pain Pain Descriptors / Indicators: Operative site guarding Pain Intervention(s): Limited activity within patient's tolerance;Monitored during session;Repositioned    Home Living Family/patient expects to be discharged to:: Private residence Living Arrangements: Spouse/significant other Available Help at Discharge: Family;Available 24 hours/day Type of Home: House Home Access: Stairs to enter Entrance Stairs-Rails: Left Entrance Stairs-Number of Steps: 2 Home Layout: One level Home Equipment: Walker - 2 wheels;Shower seat      Prior Function Level of Independence: Independent with assistive device(s)      ADL's / Homemaking Assistance Needed: Uses shower seat        Hand Dominance   Dominant Hand: Right    Extremity/Trunk Assessment   Upper Extremity Assessment: Overall WFL for tasks assessed           Lower Extremity Assessment: Generalized weakness      Cervical / Trunk Assessment: Kyphotic  Communication   Communication: HOH  Cognition Arousal/Alertness: Awake/alert Behavior During Therapy: WFL for tasks assessed/performed Overall Cognitive Status: Within  Functional Limits for tasks assessed       Memory: Decreased recall of precautions;Decreased short-term memory              General Comments      Exercises        Assessment/Plan    PT Assessment Patient needs continued PT services  PT Diagnosis Difficulty walking;Acute pain   PT Problem List Decreased strength;Decreased range of motion;Decreased activity tolerance;Decreased balance;Decreased mobility;Decreased knowledge of use of DME;Decreased safety awareness;Decreased knowledge of precautions;Pain  PT Treatment Interventions DME instruction;Gait training;Stair training;Functional mobility training;Therapeutic activities;Therapeutic exercise;Neuromuscular re-education;Patient/family education   PT Goals (Current goals can be found in the Care Plan section) Acute Rehab PT Goals Patient Stated Goal: Home today PT Goal Formulation: With patient/family Time For Goal Achievement: 01/12/15 Potential to Achieve Goals: Good    Frequency Min 5X/week   Barriers to discharge        Co-evaluation               End of Session   Activity Tolerance: Patient tolerated treatment well Patient left: in chair;with call bell/phone within reach;with family/visitor present Nurse Communication: Mobility status         Time: 6759-1638 PT Time Calculation (min) (ACUTE ONLY): 19 min   Charges:   PT Evaluation $Initial PT Evaluation Tier I: 1 Procedure     PT G CodesRolinda Roan 2015-01-12, 8:50 AM   Rolinda Roan, PT, DPT Acute Rehabilitation Services Pager: [phone]

## 2015-01-05 NOTE — Progress Notes (Signed)
Pt and wife given D/C instructions with Rx's, verbal understanding was provided. Pt's dressing changed when Hemovac was removed, incision is clean and dry. Pt's IV was removed prior to D/C. Pt D/C'd home via wheelchair @ [phone] per MD order. Pt is stable @ D/C and has no other needs at this time. Holli Humbles, RN

## 2015-01-05 NOTE — Discharge Instructions (Signed)
Call MD for: Difficulty breathing, headache or visual disturbances, extreme fatigue  °Call MD for: hives ° Call MD for: persistant dizziness or light-headedness ° Call MD for: persistant nausea and vomiting  °Call MD for: redness, tenderness, or signs of infection (pain, swelling, redness, odor or green/yellow discharge around incision site)  °Call MD for: severe uncontrolled pain  °Call MD for: temperature >100.4 Diet - low sodium heart healthy Discharge instructions  ° Call [phone] for a followup appointment. °  °Increase activity slowly Remove dressing in 48 hours  ° ° ° ° °Laminectomy - Laminotomy - Discectomy °Your surgeon has decided that a laminectomy (entire lamina removal) or laminotomy (partial lamina removal) is the best treatment for your back problem. These procedures involve removal of bone to relieve pressure on nerve roots. It allows the surgeon access to parts of the spine where other problems are located. This could be an injured disc (the cartilage-like structures located between the bones of the back). In this surgery your surgeon removes a part of the boney arch that surrounds your spinal canal. This may be compressing nerve roots. In some cases, the surgeon will remove the disc and fuse (stick together) vertebral bodies (the bones of your back) to make the spine more stable. The type of procedure you will need is usually decided prior to surgery, however modifications may be necessary. The time in surgery depends on the findings in surgery and the procedure necessary to correct the problems. °DISCECTOMY °For people with disc problems, the surgeon removes the portion of the disc that is causing the pressure on the nerve root. Some surgeons perform a micro (small) discectomy, which may require removal of only a small portion of the lamina. A disc nucleus (center) may also be removed either through a needle (percutaneous discectomy) or by injecting an enzyme called chymopapain into the disc.  Chymopapain is an enzyme that dissolves the disc. For people with back instability, the surgeon fuses vertebrae that are next to each other with tiny pieces of bone. These are used as bone grafts on the facets, or between the vertebrae. When this heals, the bones will no longer be able to move. These bone chips are often taken from the pelvic bones. Bones and bone grafts grow into one unit, stabilizing the segments of the spinal column. °LET YOUR CAREGIVER KNOW ABOUT: °· Allergies. °· Medicines taken including herbs, eyedrops, over-the-counter medicines, and creams. °· Use of steroids (by mouth or creams). °· Previous problems with anesthetics or numbing medicine. °· Possibility of pregnancy, if this applies. °· History of blood clots (thrombophlebitis). °· History of bleeding or blood problems. °· Previous surgery. °· Other health problems. °RISKS AND COMPLICATIONS °Your caregiver will discuss possible risks and complications with you before surgery. In addition to the usual risks of anesthesia, other common risks and complications include: °· Blood loss and replacement. °· Temporary increase in pain due to surgery. °· Uncorrected back pain. °· Infection. °· New nerve damage (tingling, numbness, and pain). °BEFORE THE PROCEDURE °· Stop smoking at least 1 week prior to surgery. This lowers risk during surgery. °· Your caregiver may advise that you stop taking certain medicines that may affect the outcome of the surgery and your ability to heal. For example, you may need to stop taking anti-inflammatories, such as aspirin, because of possible bleeding problems. Other medicines may have interactions with anesthesia. °· Tell your caregiver if you have been on steroids for long periods of time. Often, additional steroids are administered intravenously   before and during the procedure to prevent complications. °· You should be present 60 minutes prior to your procedure or as directed. °AFTER THE PROCEDURE °After surgery,  you will be taken to the recovery area where a nurse will watch and check your progress. Generally, you will be allowed to go home within 1 week barring other problems. °HOME CARE INSTRUCTIONS  °· Check the surgical cut (incision) twice a day for signs of infection. Some signs may include a bad smelling, greenish or yellowish discharge from the wound; increased pain or increased redness over the incision site; an opening of the incision; flu-like symptoms; or a temperature above 101.5° F (38.6° C). °· Change your bandages in about 24 to 36 hours following surgery or as directed. °· You may shower once the bandage is removed or as directed. Avoid bathtubs, swimming pools, and hot tubs for 3 weeks or until your incision has healed completely. If you have stitches (sutures) or staples they may be removed 2 to 3 weeks after surgery, or as directed by your caregiver. °· Follow your caregiver's instructions for activities, exercises, and physical therapy. °· Weight reduction may be beneficial if you are overweight. °· Walking is permitted. You may use a treadmill without an incline. Cut down on activities if you have discomfort. You may also go up and down stairs as tolerated. °· Do not lift anything heavier than 10 to 15 pounds. Avoid bending or twisting at the waist. Always bend your knees. °· Maintain strength and range of motion as instructed. °· No driving is permitted for 2 to 3 weeks, or as directed by your caregiver. You may be a passenger for 20 to 30 minute trips. Lying back in the passenger seat may be more comfortable for you. °· Limit your sitting to 20 to 30 minute intervals. You should lie down or walk in between sitting periods. There are no limitations for sitting in a recliner chair. °· Only take over-the-counter or prescription medicines for pain, discomfort, or fever as directed by your caregiver. °SEEK MEDICAL CARE IF:  °· There is increased bleeding (more than a small spot) from the wound. °· You  notice redness, swelling, or increasing pain in the wound. °· Pus is coming from the wound. °· An unexplained oral temperature above 102° F (38.9° C) develops. °· You notice a bad smell coming from the wound or dressing. °SEEK IMMEDIATE MEDICAL CARE IF:  °· You develop a rash. °· You have difficulty breathing. °· You have any allergic problems. °Document Released: 06/23/2000 Document Revised: 11/10/2013 Document Reviewed: 04/21/2008 °Exit[employer]® Patient Information ©2015 [employer]. This information is not intended to replace advice given to you by your health care provider. Make sure you discuss any questions you have with your health care provider. ° °

## 2015-02-02 ENCOUNTER — Ambulatory Visit (INDEPENDENT_AMBULATORY_CARE_PROVIDER_SITE_OTHER): Payer: Commercial Managed Care - HMO | Admitting: Internal Medicine

## 2015-02-02 ENCOUNTER — Encounter: Payer: Self-pay | Admitting: Internal Medicine

## 2015-02-02 VITALS — BP 88/60 | HR 58 | Ht 67.0 in | Wt 167.5 lb

## 2015-02-02 DIAGNOSIS — I4891 Unspecified atrial fibrillation: Secondary | ICD-10-CM

## 2015-02-02 NOTE — Progress Notes (Signed)
Electrophysiology Office Note   Date:  02/02/2015   ID:  Ernest [name], DOB 1935-12-23, MRN [id number]  PCP:  [name], MD  Cardiologist:  OI-ZT Primary Electrophysiologist:   Virl Axe, MD    Chief Complaint  Patient presents with  . other    6 month follow up. Meds reviewed by the patient verbally.      History of Present Illness: Ernest [name] is a 79 y.o. male seen in electrophysiology followup  for  atrial fibrillation-paroxysmal with rapid ventricular response associated with sinus bradycardia. He has a history of coronary disease. He is managed with apixaban.  We have discussed dofetilide and amiodarone and pacing as various contributors to a strategy. At our last visit 12/15 the decision was made to continue the current strategy using when necessary blockers for more rapid heart rates   Today, he denies symptoms of  chest pain, shortness of breath, orthopnea, PND, lower extremity edema, bleeding, or neurologic sequela.  he has no  complaints of  lightheadedness presyncope or syncope.  He has had infrequent episodes of palpitations consistent with his atrial fibrillation; it has largely been however relatively brief and not poorly tolerated  He also recently had back surgery which went VERY WELL  The patient is tolerating medications without difficulties and is otherwise without complaint today.    Past Medical History  Diagnosis Date  . Thyroid disease   . Hyperlipidemia   . BPH (benign prostatic hypertrophy)   . Diabetes mellitus without complication   . Hypertension   . Aortic atherosclerosis   . PVD (peripheral vascular disease)   . Stroke   . A-fib   . Arrhythmia   . Coronary artery disease     Dr. Serafina Royals  . GERD (gastroesophageal reflux disease)   . Pneumonia     as a child  . Arthritis   . Rosacea   . Lupus     discoid of scalp only  . Deviated nasal septum   . Hearing deficit     wears hearing aids   Past Surgical  History  Procedure Laterality Date  . Colonoscopy    . Prostate biopsy    . Cardiac catheterization      [facility] 12/24/13: 30% LM , pLAD, ostial CX. 20% mLAD, mRCA. 70% OM2 (small vessel). Med RX.   Marland Kitchen Cardiac catheterization      [facility]  . Cardiac catheterization      [facility]  . Cataract extraction w/ intraocular lens implant      right  . Multiple tooth extractions    . Lumbar laminectomy/decompression microdiscectomy N/A 01/04/2015    Procedure: LUMBAR LAMINECTOMY/DECOMPRESSION MICRODISCECTOMY 3 LEVELS;  Surgeon: Newman Pies, MD;  Location: [place] [facility];  Service: Neurosurgery;  Laterality: N/A;  L23 L34 L45 laminectomies and foraminotomies     Current Outpatient Prescriptions  Medication Sig Dispense Refill  . apixaban (ELIQUIS) 5 MG TABS tablet Take 5 mg by mouth 2 (two) times daily.     Marland Kitchen docusate sodium (COLACE) 100 MG capsule Take 1 capsule (100 mg total) by mouth 2 (two) times daily. 60 capsule 0  . doxycycline (VIBRAMYCIN) 100 MG capsule Take 100 mg by mouth daily.    Marland Kitchen esomeprazole (NEXIUM) 20 MG capsule Take 20 mg by mouth daily.     . isosorbide mononitrate (IMDUR) 30 MG 24 hr tablet Take 30 mg by mouth daily.     Marland Kitchen levothyroxine (SYNTHROID, LEVOTHROID) 112 MCG tablet Take 112 mcg by  mouth daily before breakfast.    . lovastatin (MEVACOR) 40 MG tablet Take 40 mg by mouth at bedtime.     . metFORMIN (GLUCOPHAGE) 500 MG tablet Take 500 mg by mouth 2 (two) times daily with a meal.     . metoprolol tartrate (LOPRESSOR) 25 MG tablet Take 25 mg by mouth 2 (two) times daily.    . metronidazole (NORITATE) 1 % cream Apply 1 application topically daily.    . Multiple Vitamin (MULTIVITAMIN) tablet Take 1 tablet by mouth daily.    . tamsulosin (FLOMAX) 0.4 MG CAPS capsule Take 0.4 mg by mouth 2 (two) times daily.     No current facility-administered medications for this visit.    Allergies:   Review of patient's allergies indicates no known allergies.   Social History:  The patient   reports that he has quit smoking. His smoking use included Cigarettes. He has never used smokeless tobacco. He reports that he does not drink alcohol or use illicit drugs.   Family History:  The patient's family history includes Cancer in his brother; Cancer - Lung in his brother, brother, and sister; Diabetes in his sister; Heart attack in his father and mother.    ROS:  Please see the history of present illness.   Otherwise, review of systems is negative .    PHYSICAL EXAM: VS:  BP 88/60 mmHg  Pulse 58  Ht 5\' 7"  (1.702 m)  Wt 75.978 kg (167 lb 8 oz)  BMI 26.23 kg/m2 , BMI Body mass index is 26.23 kg/(m^2). GEN: Well nourished, well developed, in no acute distress HEENT: normal Neck: JVD flat, carotid bruits, or masses Cardiac: R RR;  no murmur  rubs, + S4  Respiratory:  clear to auscultation bilaterally, normal work of breathing Back without kyphosis or CVAT GI: soft, nontender, nondistended, + BS MS: no deformity or atrophy Skin: warm and dry,  Extremities No Clubbing cyanosis  Edema Neuro:  Strength and sensation are intact Psych: euthymic mood, full affect  EKG:  EKG is ordered today. The ekg ordered today shows sinus rhythm at 68 Intervals 15/09/37 Otherwise normal   Recent Labs: 12/28/2014: BUN 20; Creatinine, Ser 0.74; Hemoglobin 14.8; Platelets 169; Potassium 4.8; Sodium 136    Lipid Panel     Component Value Date/Time   CHOL 128 08/11/2011 0448   TRIG 160 08/11/2011 0448   HDL 33* 08/11/2011 0448   VLDL 32 08/11/2011 0448   LDLCALC 63 08/11/2011 0448     Wt Readings from Last 3 Encounters:  02/02/15 75.978 kg (167 lb 8 oz)  01/04/15 76.93 kg (169 lb 9.6 oz)  12/28/14 76.93 kg (169 lb 9.6 oz)      Other studies Reviewed: none   ASSESSMENT AND PLAN:  Atrial fibrillation-paroxysmal  Coronary artery disease  Prior stroke  Sinus bradycardia  Hypotension  Without symptoms of ischemia  Atrial fibrillation is relatively infrequent.  They would  like to continue current regime for now, ie prn added BB  He is tolerating his apixaban  Low BP  Is asymptomatic  Will follow   Current medicines are reviewed at length with the patient today.   The patient does not have concerns regarding his medicines.  The following changes were made today:  none  Labs/ tests ordered today include:    Orders Placed This Encounter  Procedures  . EKG 12-Lead     Disposition:   FU with me  6 month(s)   Signed, Virl Axe, MD  02/02/2015  9:41 AM     [facility] [address] [place] [address] [phone] (office) [phone] (fax)

## 2015-02-02 NOTE — Patient Instructions (Signed)
Medication Instructions:  Your physician recommends that you continue on your current medications as directed. Please refer to the Current Medication list given to you today.   Labwork: none  Testing/Procedures: none  Follow-Up: Your physician wants you to follow-up in: six months with Dr. Caryl Comes.  You will receive a reminder letter in the mail two months in advance. If you don't receive a letter, please call our office to schedule the follow-up appointment.   Any Other Special Instructions Will Be Listed Below (If Applicable).

## 2015-03-10 ENCOUNTER — Encounter: Payer: Self-pay | Admitting: Urgent Care

## 2015-03-10 ENCOUNTER — Inpatient Hospital Stay
Admission: EM | Admit: 2015-03-10 | Discharge: 2015-03-11 | DRG: 310 | Disposition: A | Discharge status: Home / Self Care | Payer: Commercial Managed Care - HMO | Attending: Internal Medicine | Admitting: Internal Medicine

## 2015-03-10 ENCOUNTER — Emergency Department: Payer: Commercial Managed Care - HMO

## 2015-03-10 DIAGNOSIS — I10 Essential (primary) hypertension: Secondary | ICD-10-CM | POA: Diagnosis present

## 2015-03-10 DIAGNOSIS — Z87891 Personal history of nicotine dependence: Secondary | ICD-10-CM

## 2015-03-10 DIAGNOSIS — I4891 Unspecified atrial fibrillation: Secondary | ICD-10-CM | POA: Diagnosis present

## 2015-03-10 DIAGNOSIS — I480 Paroxysmal atrial fibrillation: Secondary | ICD-10-CM | POA: Diagnosis present

## 2015-03-10 DIAGNOSIS — I2510 Atherosclerotic heart disease of native coronary artery without angina pectoris: Secondary | ICD-10-CM | POA: Diagnosis present

## 2015-03-10 DIAGNOSIS — I739 Peripheral vascular disease, unspecified: Secondary | ICD-10-CM | POA: Diagnosis present

## 2015-03-10 DIAGNOSIS — Z8673 Personal history of transient ischemic attack (TIA), and cerebral infarction without residual deficits: Secondary | ICD-10-CM

## 2015-03-10 DIAGNOSIS — E119 Type 2 diabetes mellitus without complications: Secondary | ICD-10-CM | POA: Diagnosis present

## 2015-03-10 DIAGNOSIS — R079 Chest pain, unspecified: Secondary | ICD-10-CM | POA: Diagnosis present

## 2015-03-10 DIAGNOSIS — I340 Nonrheumatic mitral (valve) insufficiency: Secondary | ICD-10-CM | POA: Diagnosis not present

## 2015-03-10 DIAGNOSIS — K219 Gastro-esophageal reflux disease without esophagitis: Secondary | ICD-10-CM | POA: Diagnosis present

## 2015-03-10 DIAGNOSIS — Z801 Family history of malignant neoplasm of trachea, bronchus and lung: Secondary | ICD-10-CM

## 2015-03-10 DIAGNOSIS — N400 Enlarged prostate without lower urinary tract symptoms: Secondary | ICD-10-CM | POA: Diagnosis present

## 2015-03-10 DIAGNOSIS — H9190 Unspecified hearing loss, unspecified ear: Secondary | ICD-10-CM | POA: Diagnosis present

## 2015-03-10 DIAGNOSIS — M1990 Unspecified osteoarthritis, unspecified site: Secondary | ICD-10-CM | POA: Diagnosis present

## 2015-03-10 DIAGNOSIS — Z833 Family history of diabetes mellitus: Secondary | ICD-10-CM | POA: Diagnosis not present

## 2015-03-10 DIAGNOSIS — Z8249 Family history of ischemic heart disease and other diseases of the circulatory system: Secondary | ICD-10-CM

## 2015-03-10 DIAGNOSIS — E785 Hyperlipidemia, unspecified: Secondary | ICD-10-CM | POA: Diagnosis present

## 2015-03-10 LAB — BASIC METABOLIC PANEL
Anion gap: 8 (ref 5–15)
BUN: 23 mg/dL — AB (ref 6–20)
CO2: 27 mmol/L (ref 22–32)
CREATININE: 0.92 mg/dL (ref 0.61–1.24)
Calcium: 10.5 mg/dL — ABNORMAL HIGH (ref 8.9–10.3)
Chloride: 105 mmol/L (ref 101–111)
GFR calc Af Amer: >60 mL/min (ref >60)
GFR calc non Af Amer: >60 mL/min (ref >60)
Glucose, Bld: 163 mg/dL — ABNORMAL HIGH (ref 65–99)
Potassium: 3.7 mmol/L (ref 3.5–5.1)
Sodium: 140 mmol/L (ref 135–145)

## 2015-03-10 LAB — CBC
HCT: 44.4 % (ref 40.0–52.0)
Hemoglobin: 15.2 g/dL (ref 13.0–18.0)
MCH: 29.8 pg (ref 26.0–34.0)
MCHC: 34.2 g/dL (ref 32.0–36.0)
MCV: 87.3 fL (ref 80.0–100.0)
PLATELETS: 155 10*3/uL (ref 150–440)
RBC: 5.08 MIL/uL (ref 4.40–5.90)
RDW: 13.7 % (ref 11.5–14.5)
WBC: 9.2 10*3/uL (ref 3.8–10.6)

## 2015-03-10 LAB — TROPONIN I: Troponin I: <0.03 ng/mL (ref <0.031)

## 2015-03-10 MED ORDER — DILTIAZEM HCL 25 MG/5ML IV SOLN
10.0000 mg | Freq: Once | INTRAVENOUS | Status: AC
  Administered 2015-03-10: 10 mg via INTRAVENOUS
  Filled 2015-03-10: qty 5

## 2015-03-10 MED ORDER — DILTIAZEM HCL 25 MG/5ML IV SOLN
10.0000 mg | Freq: Once | INTRAVENOUS | Status: AC
  Administered 2015-03-10: 10 mg via INTRAVENOUS

## 2015-03-10 MED ORDER — DEXTROSE 5 % IV SOLN
5.0000 mg/h | Freq: Once | INTRAVENOUS | Status: AC
  Administered 2015-03-11: 5 mg/h via INTRAVENOUS
  Filled 2015-03-10: qty 100

## 2015-03-10 NOTE — ED Notes (Signed)
Patient presents with c/o increased HR and pain "under both of my arms". Patient denies SOB, N/V, and diaphoresis. PMH significant for A.fib.

## 2015-03-10 NOTE — ED Notes (Signed)
Patient with hx of AFIB. Began to get pain under both arms tonight after eating dinner. States pain has resolved but still with HR in 140's.

## 2015-03-10 NOTE — ED Provider Notes (Signed)
[facility] Emergency Department Provider Note  Time seen: 9:06 PM  I have reviewed the triage vital signs and the nursing notes.   HISTORY  Chief Complaint Atrial Fibrillation    HPI Ernest [name] is a 79 y.o. male with a past medical history of diabetes, hypertension, hyperlipidemia, CVA, atrial fibrillation on Eliquis, who presents to the emergency department for nausea, dizziness and chest pain. According to the patient around 6 PM while eating dinner the patient noted his heart to be skipping, he began feeling very weak and dizzy, he decided to lay down for nap. He was awoken approximately 30 minutes later with chest pain radiating down both of his arms and felt his heart beating very fast. Patient states his symptoms worsen so he came to the emergency department for evaluation. Patient has a history of paroxysmal atrial fibrillation on Eliquis as well as metoprolol. Patient states he has been taking office medications as prescribed. Patient states mild chest discomfort currently, no radiation of the pain currently describes it as a dull aching pain in the center of his chest.  No modifying factors identified.     Past Medical History  Diagnosis Date  . Thyroid disease   . Hyperlipidemia   . BPH (benign prostatic hypertrophy)   . Diabetes mellitus without complication   . Hypertension   . Aortic atherosclerosis   . PVD (peripheral vascular disease)   . Stroke   . A-fib   . Arrhythmia   . Coronary artery disease     Dr. Serafina Royals  . GERD (gastroesophageal reflux disease)   . Pneumonia     as a child  . Arthritis   . Rosacea   . Lupus     discoid of scalp only  . Deviated nasal septum   . Hearing deficit     wears hearing aids    Patient Active Problem List   Diagnosis Date Noted  . Lumbar stenosis with neurogenic claudication 01/04/2015    Past Surgical History  Procedure Laterality Date  . Colonoscopy    . Prostate biopsy     . Cardiac catheterization      [facility] 12/24/13: 30% LM , pLAD, ostial CX. 20% mLAD, mRCA. 70% OM2 (small vessel). Med RX.   Marland Kitchen Cardiac catheterization      [facility]  . Cardiac catheterization      [facility]  . Cataract extraction w/ intraocular lens implant      right  . Multiple tooth extractions    . Lumbar laminectomy/decompression microdiscectomy N/A 01/04/2015    Procedure: LUMBAR LAMINECTOMY/DECOMPRESSION MICRODISCECTOMY 3 LEVELS;  Surgeon: Newman Pies, MD;  Location: [facility];  Service: Neurosurgery;  Laterality: N/A;  L23 L34 L45 laminectomies and foraminotomies    Current Outpatient Rx  Name  Route  Sig  Dispense  Refill  . apixaban (ELIQUIS) 5 MG TABS tablet   Oral   Take 5 mg by mouth 2 (two) times daily.          Marland Kitchen docusate sodium (COLACE) 100 MG capsule   Oral   Take 1 capsule (100 mg total) by mouth 2 (two) times daily.   60 capsule   0   . doxycycline (VIBRAMYCIN) 100 MG capsule   Oral   Take 100 mg by mouth daily.         Marland Kitchen esomeprazole (NEXIUM) 20 MG capsule   Oral   Take 20 mg by mouth daily.          Marland Kitchen  isosorbide mononitrate (IMDUR) 30 MG 24 hr tablet   Oral   Take 30 mg by mouth daily.          Marland Kitchen levothyroxine (SYNTHROID, LEVOTHROID) 112 MCG tablet   Oral   Take 112 mcg by mouth daily before breakfast.         . lovastatin (MEVACOR) 40 MG tablet   Oral   Take 40 mg by mouth at bedtime.          . metFORMIN (GLUCOPHAGE) 500 MG tablet   Oral   Take 500 mg by mouth 2 (two) times daily with a meal.          . metoprolol tartrate (LOPRESSOR) 25 MG tablet   Oral   Take 25 mg by mouth 2 (two) times daily.         . metronidazole (NORITATE) 1 % cream   Topical   Apply 1 application topically daily.         . Multiple Vitamin (MULTIVITAMIN) tablet   Oral   Take 1 tablet by mouth daily.         . tamsulosin (FLOMAX) 0.4 MG CAPS capsule   Oral   Take 0.4 mg by mouth 2 (two) times daily.           Allergies Review of  patient's allergies indicates no known allergies.  Family History  Problem Relation Age of Onset  . Heart attack Mother   . Heart attack Father   . Cancer - Lung Sister   . Cancer - Lung Brother   . Cancer - Lung Brother   . Cancer Brother   . Diabetes Sister     Social History Social History  Substance Use Topics  . Smoking status: Former Smoker    Types: Cigarettes  . Smokeless tobacco: Never Used     Comment: " Quit smoking cigarettes in 1983 "  . Alcohol Use: No    Review of Systems Constitutional: Negative for fever. Cardiovascular: Positive for chest pain radiating down his arms. Respiratory: Positive for shortness of breath. Gastrointestinal: Negative for abdominal pain, vomiting and diarrhea. Neurological: Negative for headache 10-point ROS otherwise negative.  ____________________________________________   PHYSICAL EXAM:  VITAL SIGNS: ED Triage Vitals  Enc Vitals Group     BP 03/10/15 2100 183/101 mmHg     Pulse Rate 03/10/15 2100 166     Resp 03/10/15 2100 22     Temp 03/10/15 2100 98.6 F (37 C)     Temp Source 03/10/15 2100 Oral     SpO2 03/10/15 2100 95 %     Weight 03/10/15 2100 165 lb (74.844 kg)     Height 03/10/15 2100 5\' 7"  (1.702 m)     Head Cir --      Peak Flow --      Pain Score 03/10/15 2101 0     Pain Loc --      Pain Edu? --      Excl. in [place]? --     Constitutional: Alert and oriented. Well appearing and in no distress. Eyes: Normal exam ENT   Mouth/Throat: Mucous membranes are moist. Cardiovascular: Irregular rhythm with a rate around 150 bpm. Respiratory: Normal respiratory effort without tachypnea nor retractions. Breath sounds are clear and equal bilaterally. No wheezes/rales/rhonchi. Gastrointestinal: Soft and nontender. No distention.   Musculoskeletal: Nontender with normal range of motion in all extremities. No lower extremity tenderness or edema. Neurologic:  Normal speech and language. No gross focal neurologic  deficits  Skin:  Skin is warm, dry and intact.  Psychiatric: Mood and affect are normal. Speech and behavior are normal.  ____________________________________________    EKG  EKG reviewed and interpreted by myself shows atrial fibrillation 153 bpm, narrow QRS, nonspecific ST changes present, no ST elevations noted.  ____________________________________________    RADIOLOGY  Chest x-ray shows no acute process.  ____________________________________________    INITIAL IMPRESSION / ASSESSMENT AND PLAN / ED COURSE  Pertinent labs & imaging results that were available during my care of the patient were reviewed by me and considered in my medical decision making (see chart for details).  Patient presents with chest pain, dizziness. Patient is noted be in atrial fibrillation with rapid ventricular response around 150 bpm. Blood pressure appears hypertensive currently. I dosed 10 mg of diltiazem IV, will proceed with IV hydration while checking labs and monitoring the patient very closely on telemetry.  Patient's labs are largely at his baseline, troponin negative. Patients tachycardia had improved momentarily, but is now back in the 130 to 140 range, we will dose diltiazem IV again and monitor very closely. Patient denies any current discomfort.   Heart rate continues to exceed 130 bpm at times. We will start the patient on a diltiazem drip and admitted to the hospital for further treatment and evaluation.    CRITICAL CARE Performed by: Harvest Dark   Total critical care time: 60 minutes  Critical care time was exclusive of separately billable procedures and treating other patients.  Critical care was necessary to treat or prevent imminent or life-threatening deterioration.  Critical care was time spent personally by me on the following activities: development of treatment plan with patient and/or surrogate as well as nursing, discussions with consultants, evaluation of  patient's response to treatment, examination of patient, obtaining history from patient or surrogate, ordering and performing treatments and interventions, ordering and review of laboratory studies, ordering and review of radiographic studies, pulse oximetry and re-evaluation of patient's condition.   ____________________________________________   FINAL CLINICAL IMPRESSION(S) / ED DIAGNOSES  Atrial fibrillation with rapid ventricular response   Harvest Dark, MD 03/10/15 2309

## 2015-03-11 ENCOUNTER — Inpatient Hospital Stay (HOSPITAL_COMMUNITY)
Admit: 2015-03-11 | Discharge: 2015-03-11 | Disposition: A | Discharge status: Home / Self Care | Payer: Commercial Managed Care - HMO | Attending: Internal Medicine | Admitting: Internal Medicine

## 2015-03-11 DIAGNOSIS — I340 Nonrheumatic mitral (valve) insufficiency: Secondary | ICD-10-CM

## 2015-03-11 DIAGNOSIS — I4891 Unspecified atrial fibrillation: Secondary | ICD-10-CM | POA: Diagnosis present

## 2015-03-11 DIAGNOSIS — K219 Gastro-esophageal reflux disease without esophagitis: Secondary | ICD-10-CM | POA: Diagnosis present

## 2015-03-11 DIAGNOSIS — E785 Hyperlipidemia, unspecified: Secondary | ICD-10-CM | POA: Diagnosis present

## 2015-03-11 DIAGNOSIS — E119 Type 2 diabetes mellitus without complications: Secondary | ICD-10-CM

## 2015-03-11 DIAGNOSIS — I10 Essential (primary) hypertension: Secondary | ICD-10-CM | POA: Diagnosis present

## 2015-03-11 DIAGNOSIS — N400 Enlarged prostate without lower urinary tract symptoms: Secondary | ICD-10-CM | POA: Diagnosis present

## 2015-03-11 LAB — BASIC METABOLIC PANEL
Anion gap: 4 — ABNORMAL LOW (ref 5–15)
BUN: 20 mg/dL (ref 6–20)
CHLORIDE: 108 mmol/L (ref 101–111)
CO2: 27 mmol/L (ref 22–32)
CREATININE: 0.83 mg/dL (ref 0.61–1.24)
Calcium: 9.4 mg/dL (ref 8.9–10.3)
GFR calc Af Amer: >60 mL/min (ref >60)
GFR calc non Af Amer: >60 mL/min (ref >60)
GLUCOSE: 158 mg/dL — AB (ref 65–99)
Potassium: 4.6 mmol/L (ref 3.5–5.1)
SODIUM: 139 mmol/L (ref 135–145)

## 2015-03-11 LAB — CBC
HCT: 39.1 % — ABNORMAL LOW (ref 40.0–52.0)
Hemoglobin: 13.4 g/dL (ref 13.0–18.0)
MCH: 30 pg (ref 26.0–34.0)
MCHC: 34.4 g/dL (ref 32.0–36.0)
MCV: 87.3 fL (ref 80.0–100.0)
PLATELETS: 136 10*3/uL — AB (ref 150–440)
RBC: 4.48 MIL/uL (ref 4.40–5.90)
RDW: 14 % (ref 11.5–14.5)
WBC: 7 10*3/uL (ref 3.8–10.6)

## 2015-03-11 LAB — TROPONIN I: Troponin I: <0.03 ng/mL (ref <0.031)

## 2015-03-11 MED ORDER — DILTIAZEM HCL 30 MG PO TABS: 30.0000 mg | ORAL_TABLET | Freq: Four times a day (QID) | ORAL | Status: DC

## 2015-03-11 MED ORDER — LEVOTHYROXINE SODIUM 112 MCG PO TABS
112.0000 ug | ORAL_TABLET | Freq: Every day | ORAL | Status: DC
  Administered 2015-03-11: 112 ug via ORAL
  Filled 2015-03-11: qty 1

## 2015-03-11 MED ORDER — METFORMIN HCL 500 MG PO TABS
500.0000 mg | ORAL_TABLET | Freq: Two times a day (BID) | ORAL | Status: DC
Start: 2015-03-11 — End: 2015-03-11
  Administered 2015-03-11: 500 mg via ORAL
  Filled 2015-03-11: qty 1

## 2015-03-11 MED ORDER — ACETAMINOPHEN 325 MG PO TABS: 650.0000 mg | ORAL_TABLET | Freq: Four times a day (QID) | ORAL | Status: DC | PRN

## 2015-03-11 MED ORDER — SODIUM CHLORIDE 0.9 % IV SOLN
INTRAVENOUS | Status: AC
  Administered 2015-03-11: 03:00:00 via INTRAVENOUS

## 2015-03-11 MED ORDER — ONDANSETRON HCL 4 MG PO TABS: 4.0000 mg | ORAL_TABLET | Freq: Four times a day (QID) | ORAL | Status: DC | PRN

## 2015-03-11 MED ORDER — ISOSORBIDE MONONITRATE ER 30 MG PO TB24
30.0000 mg | ORAL_TABLET | Freq: Every day | ORAL | Status: DC
  Administered 2015-03-11: 30 mg via ORAL
  Filled 2015-03-11: qty 1

## 2015-03-11 MED ORDER — APIXABAN 5 MG PO TABS
5.0000 mg | ORAL_TABLET | Freq: Two times a day (BID) | ORAL | Status: DC
  Administered 2015-03-11: 5 mg via ORAL
  Filled 2015-03-11: qty 1

## 2015-03-11 MED ORDER — PRAVASTATIN SODIUM 20 MG PO TABS: 40.0000 mg | ORAL_TABLET | Freq: Every day | ORAL | Status: DC

## 2015-03-11 MED ORDER — ACETAMINOPHEN 650 MG RE SUPP: 650.0000 mg | Freq: Four times a day (QID) | RECTAL | Status: DC | PRN

## 2015-03-11 MED ORDER — PANTOPRAZOLE SODIUM 40 MG PO TBEC
40.0000 mg | DELAYED_RELEASE_TABLET | Freq: Every day | ORAL | Status: DC
  Administered 2015-03-11: 40 mg via ORAL
  Filled 2015-03-11: qty 1

## 2015-03-11 MED ORDER — TAMSULOSIN HCL 0.4 MG PO CAPS
0.4000 mg | ORAL_CAPSULE | Freq: Two times a day (BID) | ORAL | Status: DC
  Administered 2015-03-11: 0.4 mg via ORAL
  Filled 2015-03-11: qty 1

## 2015-03-11 MED ORDER — SODIUM CHLORIDE 0.9 % IJ SOLN
3.0000 mL | Freq: Two times a day (BID) | INTRAMUSCULAR | Status: DC
  Administered 2015-03-11: 3 mL via INTRAVENOUS

## 2015-03-11 MED ORDER — METOPROLOL TARTRATE 25 MG PO TABS
25.0000 mg | ORAL_TABLET | Freq: Two times a day (BID) | ORAL | Status: DC
  Administered 2015-03-11: 25 mg via ORAL
  Filled 2015-03-11: qty 1

## 2015-03-11 MED ORDER — ONDANSETRON HCL 4 MG/2ML IJ SOLN: 4.0000 mg | Freq: Four times a day (QID) | INTRAMUSCULAR | Status: DC | PRN

## 2015-03-11 NOTE — Progress Notes (Addendum)
Patient is admited to room 236 with diagnosis of chest pain. Patient is alert and oriented x 4 but HOH. Denied any chest pain at this time. Skin assessment done with Crystal B. RN. No skin issues of concern. Patient refused his scheduled medications, stated he took them before coming to the hospital. Wife is at beside voiced no complain. Will continue to monitor.

## 2015-03-11 NOTE — Discharge Summary (Signed)
[facility] [place] NAME: Ernest [name]    MR#:  [id number]  DATE OF BIRTH:  1936-02-25  DATE OF ADMISSION:  03/10/2015 ADMITTING PHYSICIAN: Lance Coon, MD  DATE OF DISCHARGE: No discharge date for patient encounter.  PRIMARY CARE PHYSICIAN: BABAOFF, MARC E, MD    ADMISSION DIAGNOSIS:  Atrial fibrillation with rapid ventricular response [I48.91]  DISCHARGE DIAGNOSIS:  Principal Problem:   Atrial fibrillation with RVR Active Problems:   Hypertension   Type 2 diabetes mellitus   GERD (gastroesophageal reflux disease)   Hyperlipidemia   BPH (benign prostatic hyperplasia)   Atrial fibrillation with rapid ventricular response   SECONDARY DIAGNOSIS:   Past Medical History  Diagnosis Date  . Thyroid disease   . Hyperlipidemia   . BPH (benign prostatic hypertrophy)   . Diabetes mellitus without complication   . Hypertension   . Aortic atherosclerosis   . PVD (peripheral vascular disease)   . Stroke   . A-fib   . Arrhythmia   . Coronary artery disease     Dr. Serafina Royals  . GERD (gastroesophageal reflux disease)   . Pneumonia     as a child  . Arthritis   . Rosacea   . Lupus     discoid of scalp only  . Deviated nasal septum   . Hearing deficit     wears hearing aids    HOSPITAL COURSE:  79 y.o. male with h/o CAD medically managed by cardiac cath in 12/2013, PAF on Eliquis, PVD, DM2, HTN, history of stroke, and HLD who presented to [facility] on 8/31 with Afib with RVR with rates in the 150s. Please see Dr. Tobey Grim dictated history and physical for further details.  He was admitted for atrial fibrillation with rapid ventricular rate requiring Cardizem.  Cardiology consultation was obtained with Dr. Fletcher Anon.  He was converted back to normal sinus rhythm overnight with Cardizem.  His heart rate has remained under control.  He is already on anticoagulation with Eliquis, which will be continued per cardiology.  Patient is  feeling much better and is wanting to go home.  He is being discharged home in stable condition.  He is agreeable with the discharge plan and so is his wife.  All the consultants are also agreeable with same. DISCHARGE CONDITIONS:  Stable CONSULTS OBTAINED:  Treatment Team:  Wellington Hampshire, MD DRUG ALLERGIES:  No Known Allergies DISCHARGE MEDICATIONS:   Current Discharge Medication List    START taking these medications   Details  diltiazem (CARDIZEM) 30 MG tablet Take 1 tablet (30 mg total) by mouth 4 (four) times daily. As need for rate control if not controlled by metoprolol. Qty: 30 tablet, Refills: 0      CONTINUE these medications which have NOT CHANGED   Details  apixaban (ELIQUIS) 5 MG TABS tablet Take 5 mg by mouth 2 (two) times daily.     esomeprazole (NEXIUM) 20 MG capsule Take 20 mg by mouth daily.     isosorbide mononitrate (IMDUR) 30 MG 24 hr tablet Take 30 mg by mouth daily.     levothyroxine (SYNTHROID, LEVOTHROID) 112 MCG tablet Take 112 mcg by mouth daily before breakfast.    lovastatin (MEVACOR) 40 MG tablet Take 40 mg by mouth at bedtime.     metFORMIN (GLUCOPHAGE) 500 MG tablet Take 500 mg by mouth 2 (two) times daily with a meal.     metoprolol tartrate (LOPRESSOR) 25 MG tablet Take 25 mg  by mouth 2 (two) times daily.    metronidazole (NORITATE) 1 % cream Apply 1 application topically daily.    Multiple Vitamin (MULTIVITAMIN) tablet Take 1 tablet by mouth daily.    tamsulosin (FLOMAX) 0.4 MG CAPS capsule Take 0.4 mg by mouth 2 (two) times daily.    docusate sodium (COLACE) 100 MG capsule Take 1 capsule (100 mg total) by mouth 2 (two) times daily. Qty: 60 capsule, Refills: 0    doxycycline (VIBRAMYCIN) 100 MG capsule Take 100 mg by mouth daily.       DISCHARGE INSTRUCTIONS:   DIET:  Cardiac diet  DISCHARGE CONDITION:  Good  ACTIVITY:  Activity as tolerated  OXYGEN:  Home Oxygen: No.   Oxygen Delivery: room air  DISCHARGE LOCATION:   home   If you experience worsening of your admission symptoms, develop shortness of breath, life threatening emergency, suicidal or homicidal thoughts you must seek medical attention immediately by calling 911 or calling your MD immediately  if symptoms less severe.  You Must read complete instructions/literature along with all the possible adverse reactions/side effects for all the Medicines you take and that have been prescribed to you. Take any new Medicines after you have completely understood and accpet all the possible adverse reactions/side effects.   Please note  You were cared for by a hospitalist during your hospital stay. If you have any questions about your discharge medications or the care you received while you were in the hospital after you are discharged, you can call the unit and asked to speak with the hospitalist on call if the hospitalist that took care of you is not available. Once you are discharged, your primary care physician will handle any further medical issues. Please note that NO REFILLS for any discharge medications will be authorized once you are discharged, as it is imperative that you return to your primary care physician (or establish a relationship with a primary care physician if you do not have one) for your aftercare needs so that they can reassess your need for medications and monitor your lab values.    On the day of Discharge:   VITAL SIGNS:  Blood pressure 129/64, pulse 79, temperature 97.6 F (36.4 C), temperature source Oral, resp. rate 16, height 5\' 7"  (1.702 m), weight 75.07 kg (165 lb 8 oz), SpO2 95 %.  I/O:   Intake/Output Summary (Last 24 hours) at 03/11/15 1338 Last data filed at 03/11/15 1022  Gross per 24 hour  Intake    360 ml  Output    600 ml  Net   -240 ml    PHYSICAL EXAMINATION:  GENERAL:  79 y.o.-year-old patient lying in the bed with no acute distress.  EYES: Pupils equal, round, reactive to light and accommodation. No  scleral icterus. Extraocular muscles intact.  HEENT: Head atraumatic, normocephalic. Oropharynx and nasopharynx clear.  NECK:  Supple, no jugular venous distention. No thyroid enlargement, no tenderness.  LUNGS: Normal breath sounds bilaterally, no wheezing, rales,rhonchi or crepitation. No use of accessory muscles of respiration.  CARDIOVASCULAR: S1, S2 normal. No murmurs, rubs, or gallops.  ABDOMEN: Soft, non-tender, non-distended. Bowel sounds present. No organomegaly or mass.  EXTREMITIES: No pedal edema, cyanosis, or clubbing.  NEUROLOGIC: Cranial nerves II through XII are intact. Muscle strength 5/5 in all extremities. Sensation intact. Gait not checked.  PSYCHIATRIC: The patient is alert and oriented x 3.  SKIN: No obvious rash, lesion, or ulcer.   DATA REVIEW:   CBC  Recent Labs Lab 03/11/15  0753  WBC 7.0  HGB 13.4  HCT 39.1*  PLT 136*    Chemistries   Recent Labs Lab 03/11/15 0753  NA 139  K 4.6  CL 108  CO2 27  GLUCOSE 158*  BUN 20  CREATININE 0.83  CALCIUM 9.4    Microbiology Results  Results for orders placed or performed during the hospital encounter of 12/28/14  Surgical pcr screen     Status: None   Collection Time: 12/28/14 11:03 AM  Result Value Ref Range Status   MRSA, PCR NEGATIVE NEGATIVE Final   Staphylococcus aureus NEGATIVE NEGATIVE Final    Comment:        The Xpert SA Assay (FDA approved for NASAL specimens in patients over 42 years of age), is one component of a comprehensive surveillance program.  Test performance has been validated by [facility] for patients greater than or equal to 44 year old. It is not intended to diagnose infection nor to guide or monitor treatment.     RADIOLOGY:  Dg Chest 2 View  03/10/2015   CLINICAL DATA:  Tachycardia and axillary pains, onset this evening.  EXAM: CHEST  2 VIEW  COMPARISON:  09/17/2013  FINDINGS: There is a benign-appearing biapical pleural thickening and mild chronic appearing  interstitial coarsening, upper lobe predominant. There is no evidence of superimposed acute infiltrate or congestive heart failure. Heart size is normal. Pulmonary vasculature is normal. There is no pleural effusion. Hilar, mediastinal and cardiac contours are unremarkable and unchanged.  IMPRESSION: No active cardiopulmonary disease.   Electronically Signed   By: Andreas Newport M.D.   On: 03/10/2015 21:27     Management plans discussed with the patient, family and they are in agreement.  CODE STATUS: Full code  TOTAL TIME TAKING CARE OF THIS PATIENT: 55 minutes.    [facility], Jayshon Dommer M.D on 03/11/2015 at 1:38 PM  Between 7am to 6pm - Pager - [phone]  After 6pm go to [email or web address] - password [email or web address] [place]  Office  [phone]  CC: Primary care physician; BABAOFF, Caryl Bis, MD Dr. Jolyn Nap, MD

## 2015-03-11 NOTE — ED Notes (Signed)
Patient sinus brady on monitor. Heart rate of 58. MD made aware, cardiazem drip stopped. Patient resting quietly. Wife at bedside.

## 2015-03-11 NOTE — Progress Notes (Signed)
Pt. Discharged to home via wc. Discharge instructions and medication regimen reviewed at bedside with patient and his wife. They both verbalize understanding of instructions and medication regimen; they are aware his prescription is available for pickup at his pharmacy. Patient assessment unchanged from this morning. TELE and IV discontinued per policy.

## 2015-03-11 NOTE — Consult Note (Signed)
Cardiology Consultation Note  Patient ID: Ernest [name], MRN: [id number], DOB/AGE: [birth year] 79 y.o. Admit date: 03/10/2015   Date of Consult: 03/11/2015 Primary Physician: [name], MD Primary Cardiologist: Dr. Caryl Comes, MD  Chief Complaint: Palpitations  Reason for Consult: Afib with RVR  HPI: 79 y.o. male with h/o CAD medically managed by cardiac cath in 12/2013, PAF on Eliquis, PVD, DM2, HTN, history of stroke, and HLD who presented to [facility] on 8/31 with Afib with RVR with rates in the 150s.   Previously a patient of [facility], now followed by Dr. Jolyn Nap, MD. He has known CAD and has had 2 separate heart catheterizations which revealed only single vessel circumflex disease at about 70%. Initially this was done in 2009 and most recently 6/15. He is otherwise modest nonobstructive disease. LV function was normal at echocardiogram 3/15 indicating also modest left atrial enlargement (4.3) this was largely unchanged from an echocardiogram 8/12. He has known PAF with RVR with associated sinus bradycardia on Eliquis. Previously discussed rate control strategies vs rhythm control. The concerns with the former was that it would precipitate the need for backup bradycardia pacing; the use of amiodarone for almost certainly accomplish the same thing. Decision was made to continue rate control with prn beta blocker for more rapid rates. Afib has been infrequent. Recent back surgery in June of 2016 that went well.      He presented on 8/31 with Afib with RVR after having a day full of Afib on 8/30. He had just finished eating dinner on 8/31 when he went into Afib with RVR. This was associated with his usual chest pain, though this time it was on the bilateral chest. Some associated SOB. His weight has been stable as of late with no LEE, abdominal swelling, or early satiety. No recent infections, illnesses, or stressors. It was noted he drinks multiple cans of diet Pepsi daily.   Upon his arrival to [facility] he  was found to be in Afib with RVR with rates in the 150s. He received IV Cardizem x 2 with documented response in HR. There is no documented change in Afib to sinus on tele. Upon reviewing tele he has been in sinus since this was placed. Labs showed troponin negative x 3, K+ 4.6, SCr 0.83, unremarkable CBC, no TSH checked, CXR with no active disease. This morning he is asymptomatic.      Past Medical History  Diagnosis Date  . Thyroid disease   . Hyperlipidemia   . BPH (benign prostatic hypertrophy)   . Diabetes mellitus without complication   . Hypertension   . Aortic atherosclerosis   . PVD (peripheral vascular disease)   . Stroke   . A-fib   . Arrhythmia   . Coronary artery disease     Dr. Serafina Royals  . GERD (gastroesophageal reflux disease)   . Pneumonia     as a child  . Arthritis   . Rosacea   . Lupus     discoid of scalp only  . Deviated nasal septum   . Hearing deficit     wears hearing aids      Most Recent Cardiac Studies: As above   Surgical History:  Past Surgical History  Procedure Laterality Date  . Colonoscopy    . Prostate biopsy    . Cardiac catheterization      [facility] 12/24/13: 30% LM , pLAD, ostial CX. 20% mLAD, mRCA. 70% OM2 (small vessel). Med RX.   Marland Kitchen Cardiac catheterization  [facility]  . Cardiac catheterization      [facility]  . Cataract extraction w/ intraocular lens implant      right  . Multiple tooth extractions    . Lumbar laminectomy/decompression microdiscectomy N/A 01/04/2015    Procedure: LUMBAR LAMINECTOMY/DECOMPRESSION MICRODISCECTOMY 3 LEVELS;  Surgeon: Newman Pies, MD;  Location: [facility];  Service: Neurosurgery;  Laterality: N/A;  L23 L34 L45 laminectomies and foraminotomies     Home Meds: Prior to Admission medications   Medication Sig Start Date End Date Taking? Authorizing Provider  apixaban (ELIQUIS) 5 MG TABS tablet Take 5 mg by mouth 2 (two) times daily.  02/25/14  Yes Historical Provider, MD  esomeprazole (NEXIUM) 20 MG  capsule Take 20 mg by mouth daily.    Yes Historical Provider, MD  isosorbide mononitrate (IMDUR) 30 MG 24 hr tablet Take 30 mg by mouth daily.  11/06/13  Yes Historical Provider, MD  levothyroxine (SYNTHROID, LEVOTHROID) 112 MCG tablet Take 112 mcg by mouth daily before breakfast.   Yes Historical Provider, MD  lovastatin (MEVACOR) 40 MG tablet Take 40 mg by mouth at bedtime.    Yes Historical Provider, MD  metFORMIN (GLUCOPHAGE) 500 MG tablet Take 500 mg by mouth 2 (two) times daily with a meal.    Yes Historical Provider, MD  metoprolol tartrate (LOPRESSOR) 25 MG tablet Take 25 mg by mouth 2 (two) times daily.   Yes Historical Provider, MD  metronidazole (NORITATE) 1 % cream Apply 1 application topically daily.   Yes Historical Provider, MD  Multiple Vitamin (MULTIVITAMIN) tablet Take 1 tablet by mouth daily.   Yes Historical Provider, MD  tamsulosin (FLOMAX) 0.4 MG CAPS capsule Take 0.4 mg by mouth 2 (two) times daily.   Yes Historical Provider, MD  docusate sodium (COLACE) 100 MG capsule Take 1 capsule (100 mg total) by mouth 2 (two) times daily. Patient not taking: Reported on 03/11/2015 01/05/15   Newman Pies, MD  doxycycline (VIBRAMYCIN) 100 MG capsule Take 100 mg by mouth daily.    Historical Provider, MD    Inpatient Medications:  . apixaban  5 mg Oral BID  . isosorbide mononitrate  30 mg Oral Daily  . levothyroxine  112 mcg Oral QAC breakfast  . metFORMIN  500 mg Oral BID WC  . metoprolol tartrate  25 mg Oral BID  . pantoprazole  40 mg Oral QAC breakfast  . pravastatin  40 mg Oral q1800  . sodium chloride  3 mL Intravenous Q12H  . tamsulosin  0.4 mg Oral BID   . sodium chloride 100 mL/hr at 03/11/15 0306    Allergies: No Known Allergies  Social History   Social History  . Marital Status: Married    Spouse Name: N/A  . Number of Children: N/A  . Years of Education: N/A   Occupational History  . Not on file.   Social History Main Topics  . Smoking status: Former  Smoker    Types: Cigarettes  . Smokeless tobacco: Never Used     Comment: " Quit smoking cigarettes in 1983 "  . Alcohol Use: No  . Drug Use: No  . Sexual Activity: Not on file   Other Topics Concern  . Not on file   Social History Narrative     Family History  Problem Relation Age of Onset  . Heart attack Mother   . Heart attack Father   . Cancer - Lung Sister   . Cancer - Lung Brother   . Cancer - Lung  Brother   . Cancer Brother   . Diabetes Sister      Review of Systems: Review of Systems  Constitutional: Negative for fever, chills, weight loss, malaise/fatigue and diaphoresis.  HENT: Negative for congestion.   Eyes: Negative for discharge and redness.  Respiratory: Positive for shortness of breath. Negative for cough, hemoptysis, sputum production and wheezing.        SOB resolved  Cardiovascular: Positive for chest pain and palpitations. Negative for orthopnea, claudication and PND.       Chest pain and palpitations resolved  Gastrointestinal: Negative for nausea, vomiting, blood in stool and melena.  Genitourinary: Negative for hematuria.  Musculoskeletal: Negative for falls.  Skin: Negative for rash.  Neurological: Negative for sensory change, speech change, focal weakness and weakness.  Endo/Heme/Allergies: Does not bruise/bleed easily.  Psychiatric/Behavioral: The patient is not nervous/anxious.   All other systems reviewed and are negative.   Labs:  Recent Labs  03/10/15 2109 03/11/15 0219 03/11/15 0753  TROPONINI <0.03 <0.03 <0.03   Lab Results  Component Value Date   WBC 7.0 03/11/2015   HGB 13.4 03/11/2015   HCT 39.1* 03/11/2015   MCV 87.3 03/11/2015   PLT 136* 03/11/2015    Recent Labs Lab 03/11/15 0753  NA 139  K 4.6  CL 108  CO2 27  BUN 20  CREATININE 0.83  CALCIUM 9.4  GLUCOSE 158*   Lab Results  Component Value Date   CHOL 128 08/11/2011   HDL 33* 08/11/2011   LDLCALC 63 08/11/2011   TRIG 160 08/11/2011   No results  found for: DDIMER  Radiology/Studies:  Dg Chest 2 View  03/10/2015   CLINICAL DATA:  Tachycardia and axillary pains, onset this evening.  EXAM: CHEST  2 VIEW  COMPARISON:  09/17/2013  FINDINGS: There is a benign-appearing biapical pleural thickening and mild chronic appearing interstitial coarsening, upper lobe predominant. There is no evidence of superimposed acute infiltrate or congestive heart failure. Heart size is normal. Pulmonary vasculature is normal. There is no pleural effusion. Hilar, mediastinal and cardiac contours are unremarkable and unchanged.  IMPRESSION: No active cardiopulmonary disease.   Electronically Signed   By: Andreas Newport M.D.   On: 03/10/2015 21:27    EKG: Afib with RVR, 153 bpm  Weights: [employer]   03/10/15 2100 03/11/15 0203  Weight: 165 lb (74.844 kg) 165 lb 8 oz (75.07 kg)     Physical Exam: Blood pressure 129/64, pulse 79, temperature 97.6 F (36.4 C), temperature source Oral, resp. rate 16, height 5\' 7"  (1.702 m), weight 165 lb 8 oz (75.07 kg), SpO2 95 %. Body mass index is 25.91 kg/(m^2). General: Well developed, well nourished, in no acute distress. Head: Normocephalic, atraumatic, sclera non-icteric, no xanthomas, nares are without discharge.  Neck: Negative for carotid bruits. JVD not elevated. Lungs: Clear bilaterally to auscultation without wheezes, rales, or rhonchi. Breathing is unlabored. Heart: RRR with S1 S2. No murmurs, rubs, or gallops appreciated. Abdomen: Soft, non-tender, non-distended with normoactive bowel sounds. No hepatomegaly. No rebound/guarding. No obvious abdominal masses. Msk:  Strength and tone appear normal for age. Extremities: No clubbing or cyanosis. No edema.  Distal pedal pulses are 2+ and equal bilaterally. Neuro: Alert and oriented X 3. No facial asymmetry. No focal deficit. Moves all extremities spontaneously. Psych:  Responds to questions appropriately with a normal affect.    Assessment and Plan:  79  y.o. male with h/o CAD medically managed by cardiac cath in 12/2013, PAF on Eliquis, PVD, DM2, HTN,  history of stroke, and HLD who presented to [facility] on 8/31 with Afib with RVR with rates in the 150s.  1. PAF with RVR: -Has been in sinus rhythm since being placed on telemetry -Drinks multiple Diet Pepsi daily, advised cessation  -Previously discussed rate control strategies vs rhythm control. The concerns with the former was that it would precipitate the need for backup bradycardia pacing; the use of amiodarone for almost certainly accomplish the same thing. Decision was made to continue rate control with prn beta blocker for more rapid rates -Add prn diltiazem 30 mg for added rate control should his prn metoprolol not break his Afib with RVR -Should his Afib burden increase, ultimately may need antiarrythmic  -Continue scheduled metoprolol and prn metoprolol -Continue Eliquis -CHADSVASc at least 7 (HTN, age x 2, DM, stroke, vascular disease)  2. CAD: -Medically managed by recent cardiac cath as above -Chest pain associated with Afib with RVR -Resolved -No ischemic evaluation planned at this time -Continue current medications  3. HTN: -Controlled -Continue current medications  4. DM2: -Per IM  5. History of stroke  Gladstone Lighter Pager: [phone] 03/11/2015, 11:04 AM

## 2015-03-11 NOTE — Progress Notes (Signed)
*  PRELIMINARY RESULTS* Echocardiogram 2D Echocardiogram has been performed.  Ernest [name] 03/11/2015, 1:49 PM

## 2015-03-11 NOTE — Progress Notes (Signed)
This RN verified with patient's pharmacy that prescriptions have been sent over there. Pharmacy confirmed they have been received.

## 2015-03-11 NOTE — Discharge Instructions (Signed)
Atrial Fibrillation  Atrial fibrillation is a condition that causes your heart to beat irregularly. It may also cause your heart to beat faster than normal. Atrial fibrillation can prevent your heart from pumping blood normally. It increases your risk of stroke and heart problems.  HOME CARE  · Take medications as told by your doctor.  · Only take medications that your doctor says are safe. Some medications can make the condition worse or happen again.  · If blood thinners were prescribed by your doctor, take them exactly as told. Too much can cause bleeding. Too little and you will not have the needed protection against stroke and other problems.  · Perform blood tests at home if told by your doctor.  · Perform blood tests exactly as told by your doctor.  · Do not drink alcohol.  · Do not drink beverages with caffeine such as coffee, soda, and some teas.  · Maintain a healthy weight.  · Do not use diet pills unless your doctor says they are safe. They may make heart problems worse.  · Follow diet instructions as told by your doctor.  · Exercise regularly as told by your doctor.  · Keep all follow-up appointments.  GET HELP IF:  · You notice a change in the speed, rhythm, or strength of your heartbeat.  · You suddenly begin peeing (urinating) more often.  · You get tired more easily when moving or exercising.  GET HELP RIGHT AWAY IF:   · You have chest or belly (abdominal) pain.  · You feel sick to your stomach (nauseous).  · You are short of breath.  · You suddenly have swollen feet and ankles.  · You feel dizzy.  · You face, arms, or legs feel numb or weak.  · There is a change in your vision or speech.  MAKE SURE YOU:   · Understand these instructions.  · Will watch your condition.  · Will get help right away if you are not doing well or get worse.  Document Released: 04/04/2008 Document Revised: 11/10/2013 Document Reviewed: 08/06/2012  ExitCare® Patient Information ©2015 [employer]. This information is not  intended to replace advice given to you by your health care provider. Make sure you discuss any questions you have with your health care provider.

## 2015-03-11 NOTE — H&P (Signed)
[facility] NAME: Ernest [name]    MR#:  [id number]  DATE OF BIRTH:  1936-03-24  DATE OF ADMISSION:  03/10/2015  PRIMARY CARE PHYSICIAN: BABAOFF, Caryl Bis, MD   REQUESTING/REFERRING PHYSICIAN: Paduchowski, MD  CHIEF COMPLAINT:   Chief Complaint  Patient presents with  . Atrial Fibrillation    HISTORY OF PRESENT ILLNESS:  Ernest [name]  is a 79 y.o. male who presents with chest pain and atrial fibrillation with RVR. Patient states that he has paroxysmal A. fib, which has caused chest pain in the past when his heart rate goes up significantly. He began feeling this discomfort earlier this afternoon, but states that it is never "moved down into my arm" before, but that it did today moved to his left arm and so he decided to come to the ED for evaluation. Here he was found to be in A. fib with RVR with a heart rate in the 150s which responded initially to diltiazem IV, but then went back up again. Second dose of diltiazem also produced good rate control recently, but then his heart rate went back up again. His chest discomfort returned/gout worse each time his heart rate became significantly more tachycardic again. Hospitals were called for admission for A. fib with RVR requiring treatment with diltiazem drip.  PAST MEDICAL HISTORY:   Past Medical History  Diagnosis Date  . Thyroid disease   . Hyperlipidemia   . BPH (benign prostatic hypertrophy)   . Diabetes mellitus without complication   . Hypertension   . Aortic atherosclerosis   . PVD (peripheral vascular disease)   . Stroke   . A-fib   . Arrhythmia   . Coronary artery disease     Dr. Serafina Royals  . GERD (gastroesophageal reflux disease)   . Pneumonia     as a child  . Arthritis   . Rosacea   . Lupus     discoid of scalp only  . Deviated nasal septum   . Hearing deficit     wears hearing aids    PAST SURGICAL HISTORY:   Past Surgical History   Procedure Laterality Date  . Colonoscopy    . Prostate biopsy    . Cardiac catheterization      [facility] 12/24/13: 30% LM , pLAD, ostial CX. 20% mLAD, mRCA. 70% OM2 (small vessel). Med RX.   Marland Kitchen Cardiac catheterization      [facility]  . Cardiac catheterization      [facility]  . Cataract extraction w/ intraocular lens implant      right  . Multiple tooth extractions    . Lumbar laminectomy/decompression microdiscectomy N/A 01/04/2015    Procedure: LUMBAR LAMINECTOMY/DECOMPRESSION MICRODISCECTOMY 3 LEVELS;  Surgeon: Newman Pies, MD;  Location: [place];  Service: Neurosurgery;  Laterality: N/A;  L23 L34 L45 laminectomies and foraminotomies    SOCIAL HISTORY:   Social History  Substance Use Topics  . Smoking status: Former Smoker    Types: Cigarettes  . Smokeless tobacco: Never Used     Comment: " Quit smoking cigarettes in 1983 "  . Alcohol Use: No    FAMILY HISTORY:   Family History  Problem Relation Age of Onset  . Heart attack Mother   . Heart attack Father   . Cancer - Lung Sister   . Cancer - Lung Brother   . Cancer - Lung Brother   . Cancer Brother   . Diabetes Sister  DRUG ALLERGIES:  No Known Allergies  MEDICATIONS AT HOME:   Prior to Admission medications   Medication Sig Start Date End Date Taking? Authorizing Provider  apixaban (ELIQUIS) 5 MG TABS tablet Take 5 mg by mouth 2 (two) times daily.  02/25/14   Historical Provider, MD  docusate sodium (COLACE) 100 MG capsule Take 1 capsule (100 mg total) by mouth 2 (two) times daily. 01/05/15   Newman Pies, MD  doxycycline (VIBRAMYCIN) 100 MG capsule Take 100 mg by mouth daily.    Historical Provider, MD  esomeprazole (NEXIUM) 20 MG capsule Take 20 mg by mouth daily.     Historical Provider, MD  isosorbide mononitrate (IMDUR) 30 MG 24 hr tablet Take 30 mg by mouth daily.  11/06/13   Historical Provider, MD  levothyroxine (SYNTHROID, LEVOTHROID) 112 MCG tablet Take 112 mcg by mouth daily before breakfast.     Historical Provider, MD  lovastatin (MEVACOR) 40 MG tablet Take 40 mg by mouth at bedtime.     Historical Provider, MD  metFORMIN (GLUCOPHAGE) 500 MG tablet Take 500 mg by mouth 2 (two) times daily with a meal.     Historical Provider, MD  metoprolol tartrate (LOPRESSOR) 25 MG tablet Take 25 mg by mouth 2 (two) times daily.    Historical Provider, MD  metronidazole (NORITATE) 1 % cream Apply 1 application topically daily.    Historical Provider, MD  Multiple Vitamin (MULTIVITAMIN) tablet Take 1 tablet by mouth daily.    Historical Provider, MD  tamsulosin (FLOMAX) 0.4 MG CAPS capsule Take 0.4 mg by mouth 2 (two) times daily.    Historical Provider, MD    REVIEW OF SYSTEMS:  Review of Systems  Constitutional: Negative for fever, chills, weight loss and malaise/fatigue.  HENT: Negative for ear pain, hearing loss and tinnitus.   Eyes: Negative for blurred vision, double vision, pain and redness.  Respiratory: Positive for shortness of breath. Negative for cough and hemoptysis.   Cardiovascular: Positive for chest pain and palpitations. Negative for orthopnea and leg swelling.  Gastrointestinal: Negative for nausea, vomiting, abdominal pain, diarrhea and constipation.  Genitourinary: Negative for dysuria, frequency and hematuria.  Musculoskeletal: Negative for back pain, joint pain and neck pain.  Skin:       No acne, rash, or lesions  Neurological: Negative for dizziness, tremors, focal weakness and weakness.  Endo/Heme/Allergies: Negative for polydipsia. Does not bruise/bleed easily.  Psychiatric/Behavioral: Negative for depression. The patient is not nervous/anxious and does not have insomnia.      VITAL SIGNS:   Filed Vitals:   03/10/15 2315 03/10/15 2330 03/10/15 2345 03/11/15 0000  BP: 117/73 113/70 129/78 94/71  Pulse: 83 100 91 72  Temp:      TempSrc:      Resp: 30 24 25 25   Height:      Weight:      SpO2: 97% 96% 97% 97%   Wt Readings from Last 3 Encounters:  03/10/15  74.844 kg (165 lb)  02/02/15 75.978 kg (167 lb 8 oz)  01/04/15 76.93 kg (169 lb 9.6 oz)    PHYSICAL EXAMINATION:  Physical Exam  Vitals reviewed. Constitutional: He is oriented to person, place, and time. He appears well-developed and well-nourished. No distress.  HENT:  Head: Normocephalic and atraumatic.  Mouth/Throat: Oropharynx is clear and moist.  Eyes: Conjunctivae and EOM are normal. Pupils are equal, round, and reactive to light. No scleral icterus.  Neck: Normal range of motion. Neck supple. No JVD present. No thyromegaly present.  Cardiovascular:  Intact distal pulses.  Exam reveals no gallop and no friction rub.   No murmur heard. Tachycardic with irregular rhythm  Respiratory: Effort normal and breath sounds normal. No respiratory distress. He has no wheezes. He has no rales.  GI: Soft. Bowel sounds are normal. He exhibits no distension. There is no tenderness.  Musculoskeletal: Normal range of motion. He exhibits no edema.  No arthritis, no gout  Lymphadenopathy:    He has no cervical adenopathy.  Neurological: He is alert and oriented to person, place, and time. No cranial nerve deficit.  No dysarthria, no aphasia  Skin: Skin is warm and dry. No rash noted. No erythema.  Psychiatric: He has a normal mood and affect. His behavior is normal. Judgment and thought content normal.    LABORATORY PANEL:   CBC  Recent Labs Lab 03/10/15 2109  WBC 9.2  HGB 15.2  HCT 44.4  PLT 155   ------------------------------------------------------------------------------------------------------------------  Chemistries   Recent Labs Lab 03/10/15 2109  NA 140  K 3.7  CL 105  CO2 27  GLUCOSE 163*  BUN 23*  CREATININE 0.92  CALCIUM 10.5*   ------------------------------------------------------------------------------------------------------------------  Cardiac Enzymes  Recent Labs Lab 03/10/15 2109  TROPONINI <0.03    ------------------------------------------------------------------------------------------------------------------  RADIOLOGY:  Dg Chest 2 View  03/10/2015   CLINICAL DATA:  Tachycardia and axillary pains, onset this evening.  EXAM: CHEST  2 VIEW  COMPARISON:  09/17/2013  FINDINGS: There is a benign-appearing biapical pleural thickening and mild chronic appearing interstitial coarsening, upper lobe predominant. There is no evidence of superimposed acute infiltrate or congestive heart failure. Heart size is normal. Pulmonary vasculature is normal. There is no pleural effusion. Hilar, mediastinal and cardiac contours are unremarkable and unchanged.  IMPRESSION: No active cardiopulmonary disease.   Electronically Signed   By: Andreas Newport M.D.   On: 03/10/2015 21:27    EKG:   Orders placed or performed during the hospital encounter of 03/10/15  . ED EKG within 10 minutes  . ED EKG within 10 minutes    IMPRESSION AND PLAN:  Principal Problem:   Atrial fibrillation with RVR - responding to diltiazem, but requiring IV infusion to maintain rate control. Admit with IV infusion of diltiazem, trend cardiac enzymes, get echocardiogram, cardiology consult. Active Problems:   Hypertension - continue home meds   Type 2 diabetes mellitus - continue home dose metformin, heart healthy/carb modified diet   Hyperlipidemia - continue home statin   GERD (gastroesophageal reflux disease) - equivalent home dose PPI   BPH (benign prostatic hyperplasia) - continue home meds  All the records are reviewed and case discussed with ED provider. Management plans discussed with the patient and/or family.  DVT PROPHYLAXIS: systemic anticoagulation  ADMISSION STATUS: Inpatient  CODE STATUS: Full  TOTAL TIME TAKING CARE OF THIS PATIENT: 50 minutes.    Taleigha Pinson Veneta 03/11/2015, 12:13 AM  [facility]  Office  [phone]  CC: Primary care physician; [name], MD

## 2015-03-17 ENCOUNTER — Encounter: Payer: Self-pay | Admitting: Nurse Practitioner

## 2015-03-17 ENCOUNTER — Ambulatory Visit (INDEPENDENT_AMBULATORY_CARE_PROVIDER_SITE_OTHER): Payer: Commercial Managed Care - HMO | Admitting: Nurse Practitioner

## 2015-03-17 VITALS — BP 102/60 | HR 71 | Ht 67.0 in | Wt 170.6 lb

## 2015-03-17 DIAGNOSIS — I480 Paroxysmal atrial fibrillation: Secondary | ICD-10-CM | POA: Insufficient documentation

## 2015-03-17 DIAGNOSIS — I2510 Atherosclerotic heart disease of native coronary artery without angina pectoris: Secondary | ICD-10-CM

## 2015-03-17 DIAGNOSIS — I10 Essential (primary) hypertension: Secondary | ICD-10-CM | POA: Diagnosis not present

## 2015-03-17 MED ORDER — METOPROLOL TARTRATE 25 MG PO TABS: 25.0000 mg | ORAL_TABLET | Freq: Three times a day (TID) | ORAL | Status: DC

## 2015-03-17 NOTE — Patient Instructions (Signed)
Medication Instructions:  Your physician has recommended you make the following change in your medication:  INCREASE Metoprolol to 25mg  three times daily. An Rx has been sent to your pharmacy   Labwork: None ordered  Testing/Procedures: None ordered  Follow-Up: You have a follow up appointment scheduled on 04/13/15 @ 9:45am with Dr.Klein in [place]  Any Other Special Instructions Will Be Listed Below (If Applicable).

## 2015-03-17 NOTE — Progress Notes (Signed)
Patient Name: Ernest [name] Date of Encounter: 03/17/2015  Primary Care Provider:  [name], MD Primary Cardiologist:  Olin Pia, MD   Chief Complaint  79 year old male with a prior history of paroxysmal atrial fibrillation who presents for hospital follow-up after recent admission related to A. fib.  Past Medical History   Past Medical History  Diagnosis Date  . Thyroid disease   . Hyperlipidemia   . BPH (benign prostatic hypertrophy)   . Diabetes mellitus without complication   . Essential hypertension   . Aortic atherosclerosis   . PVD (peripheral vascular disease)   . Stroke   . PAF (paroxysmal atrial fibrillation)     a. CHA2DS2VASc = 7-->eliquis;  b. 03/2015 Echo: EF 55-60%, Gr1 DD, mild MR, mildly dil LA, PASP 37mmHg.  Marland Kitchen Coronary artery disease     a. Cath 12/2013->[facility]->Med rx.  Marland Kitchen GERD (gastroesophageal reflux disease)   . Pneumonia     as a child  . Arthritis   . Rosacea   . Lupus     discoid of scalp only  . Deviated nasal septum   . Hearing deficit     wears hearing aids   Past Surgical History  Procedure Laterality Date  . Colonoscopy    . Prostate biopsy    . Cardiac catheterization      [facility] 12/24/13: 30% LM , pLAD, ostial CX. 20% mLAD, mRCA. 70% OM2 (small vessel). Med RX.   Marland Kitchen Cardiac catheterization      [facility]  . Cardiac catheterization      [facility]  . Cataract extraction w/ intraocular lens implant      right  . Multiple tooth extractions    . Lumbar laminectomy/decompression microdiscectomy N/A 01/04/2015    Procedure: LUMBAR LAMINECTOMY/DECOMPRESSION MICRODISCECTOMY 3 LEVELS;  Surgeon: Newman Pies, MD;  Location: [place] [facility];  Service: Neurosurgery;  Laterality: N/A;  L23 L34 L45 laminectomies and foraminotomies    Allergies  No Known Allergies  HPI  79 year old male with a prior history of paroxysmal atrial fibrillation, medically managed CAD, hypertension, diabetes, hyperlipidemia, and stroke. He has been on chronic  anticoagulation with eliquis and followed by Dr. Caryl Comes for paroxysmal A. fib. He has been medically managed with beta blocker therapy and has previously been reluctant to consider antiarrhythmics. He was recently hospitalized at [facility] secondary to recurrent tachypalpitations and chest pain. He was found to be in atrial fibrillation. He ruled out for myocardial infarction. He converted on IV diltiazem and subsequent tracing by cardiology with recommendation for outpatient electrophysiology follow-up to consider antiarrhythmics therapy.   Since his discharge, he has had at least 3 episodes of paroxysmal atrial fibrillation, each associated with mild chest tightness, lasting about 3 hours and resolving after taking an additional when necessary metoprolol. He remains anticoagulated on eliquis. He is still not interested in considering anti-rhythmic therapy or referral for potential ablation. He denies PND, orthopnea, dizziness, significant, edema, or early satiety. Home Medications  Prior to Admission medications   Medication Sig Start Date End Date Taking? Authorizing Provider  apixaban (ELIQUIS) 5 MG TABS tablet Take 5 mg by mouth 2 (two) times daily.  02/25/14  Yes Historical Provider, MD  diltiazem (CARDIZEM) 30 MG tablet Take 1 tablet (30 mg total) by mouth 4 (four) times daily. As need for rate control if not controlled by metoprolol. 03/11/15  Yes Vipul Manuella Ghazi, MD  doxycycline (VIBRAMYCIN) 100 MG capsule Take 100 mg by mouth daily.   Yes Historical Provider, MD  isosorbide mononitrate (IMDUR) 30 MG 24 hr tablet Take 30 mg by mouth daily.  11/06/13  Yes Historical Provider, MD  levothyroxine (SYNTHROID, LEVOTHROID) 112 MCG tablet Take 112 mcg by mouth daily before breakfast.   Yes Historical Provider, MD  lovastatin (MEVACOR) 40 MG tablet Take 40 mg by mouth at bedtime.    Yes Historical Provider, MD  metFORMIN (GLUCOPHAGE) 500 MG tablet Take 500 mg by mouth 2 (two) times daily with a meal.     Yes Historical Provider, MD  metoprolol tartrate (LOPRESSOR) 25 MG tablet Take 1 tablet (25 mg total) by mouth 3 (three) times daily. 03/17/15  Yes Rogelia Mire, NP  metronidazole (NORITATE) 1 % cream Apply 1 application topically daily.   Yes Historical Provider, MD  Multiple Vitamin (MULTIVITAMIN) tablet Take 1 tablet by mouth daily.   Yes Historical Provider, MD  tamsulosin (FLOMAX) 0.4 MG CAPS capsule Take 0.4 mg by mouth 2 (two) times daily.   Yes Historical Provider, MD    Review of Systems  As above, he has been experiencing palpitations in the setting of paroxysmal atrial fibrillation. This is always associated with chest tightness and has been for several years.  All other systems reviewed and are otherwise negative except as noted above.  Physical Exam  VS:  BP 102/60 mmHg  Pulse 71  Ht 5\' 7"  (1.702 m)  Wt 170 lb 9.6 oz (77.384 kg)  BMI 26.71 kg/m2 , BMI Body mass index is 26.71 kg/(m^2). GEN: Well nourished, well developed, in no acute distress. HEENT: normal. Neck: Supple, no JVD, carotid bruits, or masses. Cardiac: RRR, no murmurs, rubs, or gallops. No clubbing, cyanosis, edema.  Radials/DP/PT 2+ and equal bilaterally.  Respiratory:  Respirations regular and unlabored, clear to auscultation bilaterally. GI: Soft, nontender, nondistended, BS + x 4. MS: no deformity or atrophy. Skin: warm and dry, no rash. Neuro:  Strength and sensation are intact. Psych: Normal affect.  Accessory Clinical Findings  ECG - regular sinus rhythm, 71, no acute ST or T changes.  Assessment & Plan  1.  Paroxysmal atrial fibrillation: Patient has been having increasing frequency of paroxysmal atrial fibrillation and was recently hospitalized at [facility]. There, he was treated with IV diltiazem and eventually broke. Since discharge, he has had at least 3 additional episodes of paroxysmal A. fib, lasting about 3 hours and resolving after taking an additional metoprolol. Though he  expresses a desire to not have additional atrial fibrillation, he and more so, his wife, express concern about considering anti-rhythmic therapy. They also are not interested in referral to A. fib clinic for consideration of ablation. At this time, they prefer a more conservative approach and therefore I have recommended that he increase his Lopressor to 25 mg 3 times a day in hopes of avoiding having to take when necessary doses. He does not have very much blood pressure room to work with and thus other than adding digoxin, but there is not likely much more titration of his medications we could do without just adding an antiarrhythmics. Review of notes shows that discussions about Tikosyn have been held in the past and he does have a normal QTC. Again, patient is not currently interested in considering this as an option. I will arrange for him to follow-up with Dr. Caryl Comes as soon as possible, and this appears to be early October in [place]. The patient is satisfied with this timeframe as it gives [place] some time to see if adjusting metoprolol has worked. He also  has diltiazem to be taken when necessary but he has not taken this. Continue eliquis.  2. Coronary artery disease: This was previously evaluated @ [facility] by Dr. Nehemiah Massed w/ Jef[facility] Bryant.  He had medically manageable dzs by cath in 2015.  He does have chest tightness with Afib but CE were neg during recent admission.  He does not have c/p in the absence of AF and is otw active.  He is on long-acting nitrate therapy and I would be reluctant to discontinue this in the setting of history of chest tightness.  3.  Essential HTN:  Stable.  4.  Dispo:  Increase metoprolol to 25 TID today.  He has f/u with [facility] in early October in [place].  He will call us if PAF continues to remain an issue over the next few wks.   Murray Hodgkins, NP 03/17/2015, 5:46 PM

## 2015-04-13 ENCOUNTER — Encounter: Payer: Self-pay | Admitting: Internal Medicine

## 2015-04-13 ENCOUNTER — Ambulatory Visit (INDEPENDENT_AMBULATORY_CARE_PROVIDER_SITE_OTHER): Payer: Commercial Managed Care - HMO | Admitting: Internal Medicine

## 2015-04-13 ENCOUNTER — Telehealth: Payer: Self-pay | Admitting: *Deleted

## 2015-04-13 VITALS — BP 122/68 | HR 59 | Ht 67.0 in | Wt 172.5 lb

## 2015-04-13 DIAGNOSIS — I4891 Unspecified atrial fibrillation: Secondary | ICD-10-CM | POA: Diagnosis not present

## 2015-04-13 DIAGNOSIS — I480 Paroxysmal atrial fibrillation: Secondary | ICD-10-CM

## 2015-04-13 NOTE — Telephone Encounter (Signed)
lmov to see if pt can call and make 10 to 12 w fu

## 2015-04-13 NOTE — Progress Notes (Signed)
Electrophysiology Office Note   Date:  04/13/2015   ID:  RAVINDRA BARANEK, DOB March 06, 1936, MRN [id number]  PCP:  [name], MD  Cardiologist:  [facility] Primary Electrophysiologist:   Virl Axe, MD    Chief Complaint  Patient presents with  . other    1 month follow up. Meds reviewed by the patient verbally. Pt. c/o shortness of breath and A-Fib.      History of Present Illness: DETRELL UMSCHEID is a 79 y.o. male seen in electrophysiology followup  for  atrial fibrillation-paroxysmal with rapid ventricular response associated with sinus bradycardia. He has a history of coronary disease. He is managed with apixaban.  We have discussed dofetilide and amiodarone and pacing as various contributors to a strategy. At our last visit 12/15 the decision was made to continue the current strategy using when necessary blockers for more rapid heart rates  He has had an interval visit to the hospital in early September for atrial fibrillation which terminated spontaneously after about 8 hours.  DATE TEST    9/16 echo     [id number]  mild LAE         E     Since then he has had frequent interval recurrences of his atrial fibrillation. They have been associated with chest pain palpitations and some shortness of breath. He is taking Cardizem when necessary. At the hospital he increased his metoprolol from 25 twice a day--3 times a day.   Past Medical History  Diagnosis Date  . Thyroid disease   . Hyperlipidemia   . BPH (benign prostatic hypertrophy)   . Diabetes mellitus without complication ([place])   . Essential hypertension   . Aortic atherosclerosis ([place])   . PVD (peripheral vascular disease) ([place])   . Stroke ([place])   . PAF (paroxysmal atrial fibrillation) ([place])     a. CHA2DS2VASc = 7-->eliquis;  b. 03/2015 Echo: EF 55-60%, Gr1 DD, mild MR, mildly dil LA, PASP 25mmHg.  Marland Kitchen Coronary artery disease     a. Cath 12/2013->[facility]->Med rx.  Marland Kitchen GERD (gastroesophageal reflux disease)   .  Pneumonia     as a child  . Arthritis   . Rosacea   . 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 Deviated nasal septum   . Hearing deficit     wears hearing aids   Past Surgical History  Procedure Laterality Date  . Colonoscopy    . Prostate biopsy    . Cardiac catheterization      [facility] 12/24/13: 30% LM , pLAD, ostial CX. 20% mLAD, mRCA. 70% OM2 (small vessel). Med RX.   Marland Kitchen Cardiac catheterization      [facility]  . Cardiac catheter[facility]      [facility]  . Cataract extraction w/ intraocular lens implant      right  . Multiple tooth extractions    . Lumbar laminectomy/decompression microdiscectomy N/A 01/04/2015    Procedure: LUMBAR LAMINECTOMY/DECOMPRESSION MICRODISCECTOMY 3 LEVELS;  Surgeon: Newman Pies, MD;  Location: [place] [facility];  Service: Neurosurgery;  Laterality: N/A;  L23 L34 L45 laminectomies and foraminotomies     Current Outpatient Prescriptions  Medication Sig Dispense Refill  . apixaban (ELIQUIS) 5 MG TABS tablet Take 5 mg by mouth 2 (two) times daily.     Marland Kitchen diltiazem (CARDIZEM) 30 MG tablet Take 1 tablet (30 mg total) by mouth 4 (four) times daily. As need for rate control if not controlled by metoprolol. 30 tablet 0  . doxycycline (  VIBRAMYCIN) 100 MG capsule Take 100 mg by mouth daily.    . isosorbide mononitrate (IMDUR) 30 MG 24 hr tablet Take 30 mg by mouth daily.     Marland Kitchen levothyroxine (SYNTHROID, LEVOTHROID) 112 MCG tablet Take 112 mcg by mouth daily before breakfast.    . lovastatin (MEVACOR) 40 MG tablet Take 40 mg by mouth at bedtime.     . metFORMIN (GLUCOPHAGE) 500 MG tablet Take 500 mg by mouth 2 (two) times daily with a meal.     . metoprolol tartrate (LOPRESSOR) 25 MG tablet Take 1 tablet (25 mg total) by mouth 3 (three) times daily. 90 tablet 5  . metronidazole (NORITATE) 1 % cream Apply 1 application topically daily.    . Multiple Vitamin (MULTIVITAMIN) tablet Take 1 tablet by mouth daily.    . tamsulosin (FLOMAX) 0.4 MG CAPS capsule Take 0.4 mg by mouth 2 (two)  times daily.     No current facility-administered medications for this visit.    Allergies:   Review of patient's allergies indicates no known allergies.   Social History:  The patient  reports that he has quit smoking. His smoking use included Cigarettes. He has never used smokeless tobacco. He reports that he does not drink alcohol or use illicit drugs.   Family History:  The patient's family history includes Cancer in his brother; Cancer - Lung in his brother, brother, and sister; Diabetes in his sister; Heart attack in his father and mother.    ROS:  Please see the history of present illness.   Otherwise, review of systems is negative .    PHYSICAL EXAM: VS:  BP 122/68 mmHg  Pulse 59  Ht 5\' 7"  (1.702 m)  Wt 172 lb 8 oz (78.245 kg)  BMI 27.01 kg/m2 , BMI Body mass index is 27.01 kg/(m^2). GEN: Well nourished, well developed, in no acute distress HEENT: normal Neck: JVD flat, carotid bruits, or masses Cardiac: R RR;  no murmur  rubs, + S4  Respiratory:  clear to auscultation bilaterally, normal work of breathing Back without kyphosis or CVAT GI: soft, nontender, nondistended, + BS MS: no deformity or atrophy Skin: warm and dry,  Extremities No Clubbing cyanosis  Edema Neuro:  Strength and sensation are intact Psych: euthymic mood, full affect  EKG:  EKG is ordered today. The ekg ordered today shows sinus rhythm at 59 Intervals 16/09/39 Axis LXIII Recent Labs: 03/11/2015: BUN 20; Creatinine, Ser 0.83; Hemoglobin 13.4; Platelets 136*; Potassium 4.6; Sodium 139    Lipid Panel     Component Value Date/Time   CHOL 128 08/11/2011 0448   TRIG 160 08/11/2011 0448   HDL 33* 08/11/2011 0448   VLDL 32 08/11/2011 0448   LDLCALC 63 08/11/2011 0448     Wt Readings from Last 3 Encounters:  04/13/15 172 lb 8 oz (78.245 kg)  03/17/15 170 lb 9.6 oz (77.384 kg)  03/11/15 165 lb 8 oz (75.07 kg)      Other studies Reviewed: none   ASSESSMENT AND PLAN:  Atrial  fibrillation-paroxysmal  Coronary artery disease  Prior stroke  Sinus bradycardia  Hypotension  Without symptoms of ischemia  Atrial fibrillation is increasingly frequent. We discussed the use of antiarrhythmics therapy. He is not a candidate for a 1C therapy as he has occlusive coronary disease. We discussed dofetilide and amiodarone; he is not inclined to go to hospital. We discussed the side effects of amiodarone and he would like to defer trying this for some time.  \We will  plan to review this again in about 3 months.  In the interim he is to increase his metoprolol 25 3 times a day--37.5 3 times a day. We will also discontinue his Imdur  .    Labs/ tests ordered today include:    Orders Placed This Encounter  Procedures  . EKG 12-Lead     Disposition:      Signed, Virl Axe, MD  04/13/2015 10:44 AM     [facility] [address] [place] [address] [phone] (office) [phone] (fax)

## 2015-04-13 NOTE — Patient Instructions (Signed)
Medication Instructions: 1) Stop Imdur (isosorbide) 2) Change Lopressor (metoprolol tartrate) 25 mg 1 & 1/2 tablets (37.5 mg) by mouth twice daily  Labwork: - none  Procedures/Testing: - none  Follow-Up: - Your physician wants you to follow-up in: 10-12 weeks with Dr. Caryl Comes. You will receive a reminder letter in the mail two months in advance. If you don't receive a letter, please call our office to schedule the follow-up appointment.  Any Additional Special Instructions Will Be Listed Below (If Applicable). - none

## 2015-05-10 ENCOUNTER — Other Ambulatory Visit: Payer: Self-pay

## 2015-05-10 MED ORDER — APIXABAN 5 MG PO TABS: 5.0000 mg | ORAL_TABLET | Freq: Two times a day (BID) | ORAL | Status: DC

## 2015-06-20 ENCOUNTER — Emergency Department
Admission: EM | Admit: 2015-06-20 | Discharge: 2015-06-20 | Disposition: A | Discharge status: Home / Self Care | Payer: Commercial Managed Care - HMO | Attending: Emergency Medicine | Admitting: Emergency Medicine

## 2015-06-20 DIAGNOSIS — Z792 Long term (current) use of antibiotics: Secondary | ICD-10-CM | POA: Diagnosis not present

## 2015-06-20 DIAGNOSIS — Z7984 Long term (current) use of oral hypoglycemic drugs: Secondary | ICD-10-CM | POA: Diagnosis not present

## 2015-06-20 DIAGNOSIS — I10 Essential (primary) hypertension: Secondary | ICD-10-CM | POA: Diagnosis not present

## 2015-06-20 DIAGNOSIS — E119 Type 2 diabetes mellitus without complications: Secondary | ICD-10-CM | POA: Insufficient documentation

## 2015-06-20 DIAGNOSIS — I959 Hypotension, unspecified: Secondary | ICD-10-CM | POA: Diagnosis not present

## 2015-06-20 DIAGNOSIS — Z79899 Other long term (current) drug therapy: Secondary | ICD-10-CM | POA: Insufficient documentation

## 2015-06-20 DIAGNOSIS — Z87891 Personal history of nicotine dependence: Secondary | ICD-10-CM | POA: Diagnosis not present

## 2015-06-20 DIAGNOSIS — R001 Bradycardia, unspecified: Secondary | ICD-10-CM | POA: Insufficient documentation

## 2015-06-20 DIAGNOSIS — R42 Dizziness and giddiness: Secondary | ICD-10-CM | POA: Diagnosis present

## 2015-06-20 LAB — CBC WITH DIFFERENTIAL/PLATELET
BASOS ABS: 0.1 10*3/uL (ref 0–0.1)
BASOS PCT: 1 %
EOS ABS: 0.3 10*3/uL (ref 0–0.7)
EOS PCT: 3 %
HCT: 42.4 % (ref 40.0–52.0)
HEMOGLOBIN: 14.9 g/dL (ref 13.0–18.0)
LYMPHS ABS: 2.8 10*3/uL (ref 1.0–3.6)
Lymphocytes Relative: 27 %
MCH: 30.9 pg (ref 26.0–34.0)
MCHC: 35.2 g/dL (ref 32.0–36.0)
MCV: 87.6 fL (ref 80.0–100.0)
Monocytes Absolute: 1 10*3/uL (ref 0.2–1.0)
Monocytes Relative: 10 %
NEUTROS PCT: 59 %
Neutro Abs: 6.1 10*3/uL (ref 1.4–6.5)
PLATELETS: 187 10*3/uL (ref 150–440)
RBC: 4.84 MIL/uL (ref 4.40–5.90)
RDW: 13.8 % (ref 11.5–14.5)
WBC: 10.4 10*3/uL (ref 3.8–10.6)

## 2015-06-20 LAB — COMPREHENSIVE METABOLIC PANEL
ALBUMIN: 3.8 g/dL (ref 3.5–5.0)
ALK PHOS: 79 U/L (ref 38–126)
ALT: 23 U/L (ref 17–63)
AST: 19 U/L (ref 15–41)
Anion gap: 6 (ref 5–15)
BUN: 20 mg/dL (ref 6–20)
CALCIUM: 9.6 mg/dL (ref 8.9–10.3)
CHLORIDE: 104 mmol/L (ref 101–111)
CO2: 27 mmol/L (ref 22–32)
CREATININE: 0.74 mg/dL (ref 0.61–1.24)
GFR calc Af Amer: >60 mL/min (ref >60)
GFR calc non Af Amer: >60 mL/min (ref >60)
GLUCOSE: 238 mg/dL — AB (ref 65–99)
Potassium: 4.4 mmol/L (ref 3.5–5.1)
SODIUM: 137 mmol/L (ref 135–145)
Total Bilirubin: 0.6 mg/dL (ref 0.3–1.2)
Total Protein: 6.5 g/dL (ref 6.5–8.1)

## 2015-06-20 LAB — TROPONIN I: Troponin I: <0.03 ng/mL (ref <0.031)

## 2015-06-20 MED ORDER — SODIUM CHLORIDE 0.9 % IV BOLUS (SEPSIS)
1000.0000 mL | Freq: Once | INTRAVENOUS | Status: AC
  Administered 2015-06-20: 1000 mL via INTRAVENOUS

## 2015-06-20 NOTE — Discharge Instructions (Signed)
Return to emergency department for any worsening condition including chest pain, trouble breathing, dizziness or passing out, weakness, numbness, confusion altered mental status, fever, or any other symptoms concerning to you.  Make sure you to take in plenty of fluids to stay well hydrated.   Bradycardia Bradycardia is a slower-than-normal heart rate. A normal resting heart rate for an adult ranges from 60 to 100 beats per minute. With bradycardia, the resting heart rate is less than 60 beats per minute. Bradycardia is a problem if your heart cannot pump enough oxygen-rich blood through your body. Bradycardia is not a problem for everyone. For some healthy adults, a slow resting heart rate is normal.  CAUSES  Bradycardia may be caused by:  A problem with the heart's electrical system, such as heart block.  A problem with the heart's natural pacemaker (sinus node).  Heart disease, damage, or infection.  Certain medicines that treat heart conditions.  Certain conditions, such as hypothyroidism and obstructive sleep apnea. RISK FACTORS  Risk factors include:  Being 5 or older.  Having high blood pressure (hypertension), high cholesterol (hyperlipidemia), or diabetes.  Drinking heavily, using tobacco products, or using drugs.  Being stressed. SIGNS AND SYMPTOMS  Signs and symptoms include:  Light-headedness.  Faintingor near fainting.  Fatigue and weakness.  Shortness of breath.  Chest pain (angina).  Drowsiness.  Confusion.  Dizziness. DIAGNOSIS  Diagnosis of bradycardia may include:  A physical exam.  An electrocardiogram (ECG).  Blood tests. TREATMENT  Treatment for bradycardia may include:  Treatment of an underlying condition.  Pacemaker placement. A pacemaker is a small, battery-powered device that is placed under the skin and is programmed to sense your heartbeats. If your heart rate is lower than the programmed rate, the pacemaker will pace your  heart.  Changing your medicines or dosages. HOME CARE INSTRUCTIONS  Take medicines only as directed by your health care provider.  Manage any health conditions that contribute to bradycardia as directed by your health care provider.  Follow a heart-healthy diet. A dietitian can help educate you on healthy food options and changes.  Follow an exercise program approved by your health care provider.  Maintain a healthy weight. Lose weight as approved by your health care provider.  Do not use tobacco products, including cigarettes, chewing tobacco, or electronic cigarettes. If you need help quitting, ask your health care provider.  Do not use illegal drugs.  Limit alcohol intake to no more than 1 drink per day for nonpregnant women and 2 drinks per day for men. One drink equals 12 ounces of beer, 5 ounces of wine, or 1 ounces of hard liquor.  Keep all follow-up visits as directed by your health care provider. This is important. SEEK MEDICAL CARE IF:  You feel light-headed or dizzy.  You almost faint.  You feel weak or are easily fatigued during physical activity.  You experience confusion or have memory problems. SEEK IMMEDIATE MEDICAL CARE IF:   You faint.  You have an irregular heartbeat.  You have chest pain.  You have trouble breathing. MAKE SURE YOU:   Understand these instructions.  Will watch your condition.  Will get help right away if you are not doing well or get worse.   This information is not intended to replace advice given to you by your health care provider. Make sure you discuss any questions you have with your health care provider.   Document Released: 03/18/2002 Document Revised: 07/17/2014 Document Reviewed: 10/01/2013 Elsevier Interactive Patient Education 2016  [place].  Hypotension As your heart beats, it forces blood through your arteries. This force is your blood pressure. If your blood pressure is too low for you to go about your  normal activities or to support the organs of your body, you have hypotension. Hypotension is also referred to as low blood pressure. When your blood pressure becomes too low, you may not get enough blood to your brain. As a result, you may feel weak, feel lightheaded, or develop a rapid heart rate. In a more severe case, you may faint. CAUSES Various conditions can cause hypotension. These include:  Blood loss.  Dehydration.  Heart or endocrine problems.  Pregnancy.  Severe infection.  Not having a well-balanced diet filled with needed nutrients.  Severe allergic reactions (anaphylaxis). Some medicines, such as blood pressure medicine or water pills (diuretics), may lower your blood pressure below normal. Sometimes taking too much medicine or taking medicine not as directed can cause hypotension. TREATMENT  Hospitalization is sometimes required for hypotension if fluid or blood replacement is needed, if time is needed for medicines to wear off, or if further monitoring is needed. Treatment might include changing your diet, changing your medicines (including medicines aimed at raising your blood pressure), and use of support stockings. HOME CARE INSTRUCTIONS   Drink enough fluids to keep your urine clear or pale yellow.  Take your medicines as directed by your health care provider.  Get up slowly from reclining or sitting positions. This gives your blood pressure a chance to adjust.  Wear support stockings as directed by your health care provider.  Maintain a healthy diet by including nutritious food, such as fruits, vegetables, nuts, whole grains, and lean meats. SEEK MEDICAL CARE IF:  You have vomiting or diarrhea.  You have a fever for more than 2-3 days.  You feel more thirsty than usual.  You feel weak and tired. SEEK IMMEDIATE MEDICAL CARE IF:   You have chest pain or a fast or irregular heartbeat.  You have a loss of feeling in some part of your body, or you lose  movement in your arms or legs.  You have trouble speaking.  You become sweaty or feel lightheaded.  You faint. MAKE SURE YOU:   Understand these instructions.  Will watch your condition.  Will get help right away if you are not doing well or get worse.   This information is not intended to replace advice given to you by your health care provider. Make sure you discuss any questions you have with your health care provider.   Document Released: 06/26/2005 Document Revised: 04/16/2013 Document Reviewed: 12/27/2012 Elsevier Interactive Patient Education [employer].

## 2015-06-20 NOTE — ED Provider Notes (Signed)
[facility] Emergency Department Provider Note   ____________________________________________  Time seen:  I have reviewed the triage vital signs and the triage nursing note.  HISTORY  Chief Complaint Dizziness   Historian Patient and wife  HPI Ernest [name] is a 79 y.o. male who has a history of paroxysmal A. fib, coronary artery disease, but no known congestive heart failure, who is here for evaluation of low heart rate. Patient has mild dementia and wife takes care of his medications. He had taken his normal routine evening medications including his twice daily metoprolol 25 mg tablet 1-1/2 tablets in the evening. His heart rate was in the 130s, and after one hour was still in the 130s, and so the patient took a 30 mg of diltiazem and tablet which is a when necessary dose for as needed uncontrolled A. fib. He's only needed to take this one or 2 other times previously. He woke up this morning feeling shaky and his wife found his heart rate to be in the 40s and brought him in for evaluation. He had some chest discomfort yesterday evening when his heart was racing and A. fib. Currently he is asymptomatic.    Past Medical History  Diagnosis Date  . Thyroid disease   . Hyperlipidemia   . BPH (benign prostatic hypertrophy)   . Diabetes mellitus without complication ([place])   . Essential hypertension   . Aortic atherosclerosis ([place])   . PVD (peripheral vascular disease) ([place])   . Stroke ([place])   . PAF (paroxysmal atrial fibrillation) ([place])     a. CHA2DS2VASc = 7-->eliquis;  b. 03/2015 Echo: EF 55-60%, Gr1 DD, mild MR, mildly dil LA, PASP 64mmHg.  Marland Kitchen Coronary artery disease     a. Cath 12/2013->[facility]->Med rx.  Marland Kitchen GERD (gastroesophageal reflux disease)   . Pneumonia     as a child  . Arthritis   . Rosacea   . 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 Deviated nasal septum   . Hearing deficit     wears hearing aids    Patient Active Problem List    Diagnosis Date Noted  . PAF (paroxysmal atrial fibrillation) ([place])   . Coronary artery disease   . Essential hypertension   . Hypertension 03/11/2015  . Type 2 diabetes mellitus ([place]) 03/11/2015  . GERD (gastroesophageal reflux disease) 03/11/2015  . Hyperlipidemia 03/11/2015  . BPH (benign prostatic hyperplasia) 03/11/2015  . Atrial fibrillation with rapid ventricular response ([place]) 03/11/2015  . Atrial fibrillation with RVR ([place]) 03/10/2015  . Lumbar stenosis with neurogenic claudication 01/04/2015    Past Surgical History  Procedure Laterality Date  . Colonoscopy    . Prostate biopsy    . Cardiac catheterization      [facility] 12/24/13: 30% LM , pLAD, ostial CX. 20% mLAD, mRCA. 70% OM2 (small vessel). Med RX.   Marland Kitchen Cardiac catheterization      [facility]  . Cardiac catheterization      [facility]  . Cataract extraction w/ intraocular lens implant      right  . Multiple tooth extractions    . Lumbar laminectomy/decompression microdiscectomy N/A 01/04/2015    Procedure: LUMBAR LAMINECTOMY/DECOMPRESSION MICRODISCECTOMY 3 LEVELS;  Surgeon: Newman Pies, MD;  Location: [place] [facility];  Service: Neurosurgery;  Laterality: N/A;  L23 L34 L45 laminectomies and foraminotomies    Current Outpatient Rx  Name  Route  Sig  Dispense  Refill  . apixaban (ELIQUIS) 5 MG TABS tablet   Oral  Take 1 tablet (5 mg total) by mouth 2 (two) times daily.   60 tablet   5   . diltiazem (CARDIZEM) 30 MG tablet   Oral   Take 1 tablet (30 mg total) by mouth 4 (four) times daily. As need for rate control if not controlled by metoprolol.   30 tablet   0   . doxycycline (VIBRAMYCIN) 100 MG capsule   Oral   Take 100 mg by mouth daily.         Marland Kitchen levothyroxine (SYNTHROID, LEVOTHROID) 112 MCG tablet   Oral   Take 112 mcg by mouth daily before breakfast.         . lovastatin (MEVACOR) 40 MG tablet   Oral   Take 40 mg by mouth at bedtime.          . metFORMIN (GLUCOPHAGE) 500 MG tablet   Oral   Take 500 mg  by mouth 2 (two) times daily with a meal.          . metoprolol tartrate (LOPRESSOR) 25 MG tablet      Take 1 & 1/2 tablets (37.5 mg) by mouth twice daily         . metronidazole (NORITATE) 1 % cream   Topical   Apply 1 application topically daily.         . Multiple Vitamin (MULTIVITAMIN) tablet   Oral   Take 1 tablet by mouth daily.         . tamsulosin (FLOMAX) 0.4 MG CAPS capsule   Oral   Take 0.4 mg by mouth 2 (two) times daily.           Allergies Review of patient's allergies indicates no known allergies.  Family History  Problem Relation Age of Onset  . Heart attack Mother   . Heart attack Father   . Cancer - Lung Sister   . Cancer - Lung Brother   . Cancer - Lung Brother   . Cancer Brother   . Diabetes Sister     Social History Social History  Substance Use Topics  . Smoking status: Former Smoker    Types: Cigarettes  . Smokeless tobacco: Never Used     Comment: " Quit smoking cigarettes in 1983 "  . Alcohol Use: No    Review of Systems  Constitutional: Negative for fever. Eyes: Negative for visual changes. ENT: Negative for sore throat. Cardiovascular: Negative for current chest pain. Respiratory: Negative for shortness of breath. Gastrointestinal: Negative for abdominal pain, vomiting and diarrhea. Genitourinary: Negative for dysuria. Musculoskeletal: Negative for back pain. Skin: Negative for rash. Neurological: Negative for headache. Positive for dizziness last night with his heart was racing 10 point Review of Systems otherwise negative ____________________________________________   PHYSICAL EXAM:  VITAL SIGNS: ED Triage Vitals  Enc Vitals Group     BP 06/20/15 0527 84/58 mmHg     Pulse Rate 06/20/15 0527 43     Resp 06/20/15 0527 20     Temp 06/20/15 0527 96.9 F (36.1 C)     Temp Source 06/20/15 0527 Tympanic     SpO2 06/20/15 0527 96 %     Weight 06/20/15 0527 165 lb (74.844 kg)     Height 06/20/15 0527 5\' 7"  (1.702  m)     Head Cir --      Peak Flow --      Pain Score 06/20/15 0528 2     Pain Loc --      Pain Edu? --  Excl. in [place]? --      Constitutional: Alert and operative. Well appearing and in no distress. Eyes: Conjunctivae are normal. PERRL. Normal extraocular movements. ENT   Head: Normocephalic and atraumatic.   Nose: No congestion/rhinnorhea.   Mouth/Throat: Mucous membranes are moist.   Neck: No stridor. Cardiovascular/Chest: Bradycardic and regular.  No murmurs, rubs, or gallops. Respiratory: Normal respiratory effort without tachypnea nor retractions. Breath sounds are clear and equal bilaterally. No wheezes/rales/rhonchi. Gastrointestinal: Soft. No distention, no guarding, no rebound. Nontender   Genitourinary/rectal:Deferred Musculoskeletal: Nontender with normal range of motion in all extremities. No joint effusions.  No lower extremity tenderness.  No edema. Neurologic:  Normal speech and language. Poor historian. No gross or focal neurologic deficits are appreciated. Skin:  Skin is warm, dry and intact. No rash noted. Psychiatric: Mood and affect are normal. Speech and behavior are normal. Patient exhibits appropriate insight and judgment.  ____________________________________________   EKG I, Lisa Roca, MD, the attending physician have personally viewed and interpreted all ECGs.  45 bpm. Sinus bradycardia. Narrow QRS. Normal axis. Normal ST and T-wave ____________________________________________  LABS (pertinent positives/negatives)  CBC within normal limits. Comprehensive metabolic panel without significant abnormality, except glucose 238 Troponin less than 0.03  ____________________________________________  RADIOLOGY All Xrays were viewed by me. Imaging interpreted by Radiologist.  None __________________________________________  PROCEDURES  Procedure(s) performed: None  Critical Care performed:  None  ____________________________________________   ED COURSE / ASSESSMENT AND PLAN  CONSULTATIONS: None  Pertinent labs & imaging results that were available during my care of the patient were reviewed by me and considered in my medical decision making (see chart for details).   Patient is here with a symptomatic bradycardia which I suspect is probably due to medication effect of the diltiazem on top of the metoprolol. This is a when necessary dose, and he is only taking a few times before, although it hasn't done this to him before. Blood pressures low here in the emergency department, which I suspect clinically may be due to dehydration, as he states his by mouth intake has not been great. He is not having any symptoms of infection, and I don't have a concern for sepsis as a source of his low blood pressure. Patient will be gently hydrated and observed until blood pressure improved and maintained.  Pt care transferred to Dr. Archie Balboa at shift change 7am.  Pt being observed and tx with ivf for low bp.  I anticipate likely dc after hydration and improvement in bp.  Patient / Family / Caregiver informed of clinical course, medical decision-making process, and agree with plan.   I discussed return precautions, follow-up instructions, and discharged instructions with patient and/or family.  ___________________________________________   FINAL CLINICAL IMPRESSION(S) / ED DIAGNOSES   Final diagnoses:  Sinus bradycardia  Hypotension, unspecified hypotension type       Lisa Roca, MD 06/20/15 [phone]

## 2015-06-20 NOTE — ED Notes (Signed)
Pt states this am was shaking, awoke spouse who noted pt's heart rate was low. Pt's spouse states last pm pt with elevated hr of 130 and she gave him diltiazem 30mg  for tachycardia. Pt states this am when awoke with shaking he felt dizzy on standing. Pt currently with 3+ radial pulses, denies nausea, shob. Pt states had chest pain when he was shaking that has not completely abated at this time. Skin pwd. resps unlabored.

## 2015-06-20 NOTE — ED Notes (Signed)
Pt reports intermittent low HR since last night. c/o dizziness. Denies numbness. + headache at times.

## 2015-06-20 NOTE — ED Notes (Signed)
Report to denia, rn.  

## 2015-06-21 NOTE — Progress Notes (Signed)
Was review    Electrophysiology Office Note   Date:  06/22/2015   ID:  [name], DOB [birth year], MRN [id number]  PCP:  [name], MD  Cardiologist:  [id number] Primary Electrophysiologist:   Virl Axe, MD    Chief Complaint  Patient presents with  . other    10-12 week follow up. Pt. was at [facility] ER on 06/20/2015 with an A-Fib spell and dehydration. Meds reviewed by the patient verbally.      History of Present Illness: Ernest [name] is a 79 y.o. male seen in electrophysiology followup  for  atrial fibrillation-paroxysmal with rapid ventricular response associated with sinus bradycardia. He has a history of coronary disease. He is managed with apixaban.  We have discussed dofetilide and amiodarone and pacing as various contributors to a strategy. At our last visit 12/15 the decision was made to continue the current strategy using when necessary blockers for more rapid heart rates     DATE TEST    9/16 echo     [id number]  mild LAE         E     Since then he has had frequent interval recurrences of his atrial fibrillation. They have been associated with chest pain palpitations and some shortness of breath. He is taking Cardizem when necessary with increased metoprolol from 25 twice a day--3 times a day  He was in the emergency room 06/20/15. Those notes were reviewed. He will tachycardia the night before. His wife and given after diltiazem as we have discussed. He then woke up in the middle of the night thrashing; she knew his heart rate was 40. He went to the ER and his heart rate was indeed sinus in the 40s. They said "he was dehydrated.". They gave him some fluids and he felt better and he was discharged.      Past Medical History  Diagnosis Date  . Thyroid disease   . Hyperlipidemia   . BPH (benign prostatic hypertrophy)   . Diabetes mellitus without complication ([place])   . Essential hypertension   . Aortic atherosclerosis ([place])   . PVD (peripheral vascular  disease) ([place])   . Stroke ([place])   . PAF (paroxysmal atrial fibrillation) ([place])     a. CHA2DS2VASc = 7-->eliquis;  b. 03/2015 Echo: EF 55-60%, Gr1 DD, mild MR, mildly dil LA, PASP 74mmHg.  Marland Kitchen Coronary artery disease     a. Cath 12/2013->[facility]->Med rx.  Marland Kitchen GERD (gastroesophageal reflux disease)   . Pneumonia     as a child  . Arthritis   . Rosacea   . 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 Deviated nasal septum   . Hearing deficit     wears hearing aids   Past Surgical History  Procedure Laterality Date  . Colonoscopy    . Prostate biopsy    . Cardiac catheterization      [facility] 12/24/13: 30% LM , pLAD, ostial CX. 20% mLAD, mRCA. 70% OM2 (small vessel). Med RX.   Marland Kitchen Cardiac catheterization      [facility]  . [facility]      [facility]  . Cataract extraction w/ intraocular lens implant      right  . Multiple tooth extractions    . Lumbar laminectomy/decompression microdiscectomy N/A 01/04/2015    Procedure: LUMBAR LAMINECTOMY/DECOMPRESSION MICRODISCECTOMY 3 LEVELS;  Surgeon: Newman Pies, MD;  Location: [place] [facility];  Service: Neurosurgery;  Laterality: N/A;  L23 L34 L45 laminectomies and  foraminotomies     Current Outpatient Prescriptions  Medication Sig Dispense Refill  . apixaban (ELIQUIS) 5 MG TABS tablet Take 1 tablet (5 mg total) by mouth 2 (two) times daily. 60 tablet 5  . diltiazem (CARDIZEM) 30 MG tablet Take 1 tablet (30 mg total) by mouth 4 (four) times daily. As need for rate control if not controlled by metoprolol. 30 tablet 0  . doxycycline (VIBRAMYCIN) 100 MG capsule Take 100 mg by mouth daily.    Marland Kitchen levothyroxine (SYNTHROID, LEVOTHROID) 112 MCG tablet Take 112 mcg by mouth daily before breakfast.    . lovastatin (MEVACOR) 40 MG tablet Take 40 mg by mouth at bedtime.     . metFORMIN (GLUCOPHAGE) 500 MG tablet Take 500 mg by mouth 2 (two) times daily with a meal.     . metoprolol tartrate (LOPRESSOR) 25 MG tablet Take 1 & 1/2 tablets (37.5 mg) by mouth twice daily     . metronidazole (NORITATE) 1 % cream Apply 1 application topically daily.    . Multiple Vitamin (MULTIVITAMIN) tablet Take 1 tablet by mouth daily.    . tamsulosin (FLOMAX) 0.4 MG CAPS capsule Take 0.4 mg by mouth 2 (two) times daily.     No current facility-administered medications for this visit.    Allergies:   Review of patient's allergies indicates no known allergies.   Social History:  The patient  reports that he has quit smoking. His smoking use included Cigarettes. He has never used smokeless tobacco. He reports that he does not drink alcohol or use illicit drugs.   Family History:  The patient's family history includes Cancer in his brother; Cancer - Lung in his brother, brother, and sister; Diabetes in his sister; Heart attack in his father and mother.    ROS:  Please see the history of present illness.   Otherwise, review of systems is negative .    PHYSICAL EXAM: VS:  BP 132/64 mmHg  Pulse 58  Ht 5\' 7"  (1.702 m)  Wt 174 lb 8 oz (79.153 kg)  BMI 27.32 kg/m2 , BMI Body mass index is 27.32 kg/(m^2). GEN: Well nourished, well developed, in no acute distress HEENT: normal Neck: JVD flat, carotid bruits, or masses Cardiac: R RR;  no murmur  rubs, + S4  Respiratory:  clear to auscultation bilaterally, normal work of breathing Back without kyphosis or CVAT GI: soft, nontender, nondistended, + BS MS: no deformity or atrophy Skin: warm and dry,  Extremities No Clubbing cyanosis  Edema Neuro:  Strength and sensation are intact Psych: euthymic mood, full affect  EKG:  EKG is ordered today. The ekg ordered today shows sinus rhythm at 58 Interval 17/09/39 Axis LX    Recent Labs: 06/20/2015: ALT 23; BUN 20; Creatinine, Ser 0.74; Hemoglobin 14.9; Platelets 187; Potassium 4.4; Sodium 137    Lipid Panel     Component Value Date/Time   CHOL 128 08/11/2011 0448   TRIG 160 08/11/2011 0448   HDL 33* 08/11/2011 0448   VLDL 32 08/11/2011 0448   LDLCALC 63 08/11/2011 0448      Wt Readings from Last 3 Encounters:  06/22/15 174 lb 8 oz (79.153 kg)  06/20/15 165 lb (74.844 kg)  04/13/15 172 lb 8 oz (78.245 kg)      Other studies Reviewed:  ER records and labs   ASSESSMENT AND PLAN:  Atrial fibrillation-paroxysmal  Coronary artery disease  Prior stroke  Sinus bradycardia  Hypotension  Without symptoms of ischemia  He is continuing to  have problems with atrial fibrillation with a rapid rate. We have again discussed treatment options; therapeutic alternatives would include antiarrhythmic therapy with dofetilide or amiodarone, the latter concern with the bradycardia as noted in the emergency room. I suspect that this was a consequence, not of dehydration, but of added calcium blocker to his beta blocker. I should note this regard he did not have grapefruit juice.  For now, he would like to do nothing different. He is aware of the potential concerns. We will see him again in about 8 weeks area    Labs/ tests ordered today include:    Orders Placed This Encounter  Procedures  . EKG 12-Lead     Disposition:      Signed, Virl Axe, MD  06/22/2015 11:20 AM     [address] [place] [id number] [phone] (office) [phone] (fax) we'll

## 2015-06-22 ENCOUNTER — Encounter: Payer: Self-pay | Admitting: Internal Medicine

## 2015-06-22 ENCOUNTER — Ambulatory Visit (INDEPENDENT_AMBULATORY_CARE_PROVIDER_SITE_OTHER): Payer: Commercial Managed Care - HMO | Admitting: Internal Medicine

## 2015-06-22 VITALS — BP 132/64 | HR 58 | Ht 67.0 in | Wt 174.5 lb

## 2015-06-22 DIAGNOSIS — I4891 Unspecified atrial fibrillation: Secondary | ICD-10-CM | POA: Diagnosis not present

## 2015-06-22 NOTE — Patient Instructions (Signed)
Medication Instructions: - no changes  Labwork: - none  Procedures/Testing: - none  Follow-Up: - Your physician recommends that you schedule a follow-up appointment in: 2 months with Dr. Caryl Comes.  Any Additional Special Instructions Will Be Listed Below (If Applicable).

## 2015-06-29 ENCOUNTER — Other Ambulatory Visit: Payer: Self-pay | Admitting: *Deleted

## 2015-06-29 DIAGNOSIS — I480 Paroxysmal atrial fibrillation: Secondary | ICD-10-CM

## 2015-06-29 MED ORDER — METOPROLOL TARTRATE 25 MG PO TABS: ORAL_TABLET | ORAL | Status: DC

## 2015-06-29 NOTE — Telephone Encounter (Signed)
°*  STAT* If patient is at the pharmacy, call can be transferred to refill team.   1. Which medications need to be refilled? (please list name of each medication and dose if known) metoprolol 25 mg  2.5x a day  2. Which pharmacy/location (including street and city if local pharmacy) is medication to be sent to? Mail order [place]   3. Do they need a 30 day or 90 day supply? 90 day

## 2015-06-29 NOTE — Telephone Encounter (Signed)
Refill sent for metoprolol.  

## 2015-07-15 ENCOUNTER — Telehealth: Payer: Self-pay | Admitting: *Deleted

## 2015-07-15 NOTE — Telephone Encounter (Signed)
S/w pt who states he "had another bad spell" December 31 that lasted 3 days. Pt describes this as "my chest was hurting and I can't keep taking this". States he did not go to ER because he can't afford $1000 and that's what they would charge him. Pt indicates at Dec 13 OV w/Dr. Caryl Comes, they discussed a pacemaker and he is ready to proceed. He has February appt but would like something sooner. Forward to scheduling.

## 2015-07-15 NOTE — Telephone Encounter (Signed)
Pt is calling stating he would like to try and get a pacer  Talked to the doctor about this last time he was here Is ready to do this. Please advise.

## 2015-08-03 ENCOUNTER — Encounter: Payer: Self-pay | Admitting: Internal Medicine

## 2015-08-03 ENCOUNTER — Ambulatory Visit (INDEPENDENT_AMBULATORY_CARE_PROVIDER_SITE_OTHER): Payer: PPO | Admitting: Internal Medicine

## 2015-08-03 VITALS — BP 106/50 | HR 55 | Ht 67.0 in | Wt 168.8 lb

## 2015-08-03 DIAGNOSIS — I480 Paroxysmal atrial fibrillation: Secondary | ICD-10-CM

## 2015-08-03 DIAGNOSIS — R001 Bradycardia, unspecified: Secondary | ICD-10-CM | POA: Diagnosis not present

## 2015-08-03 NOTE — Patient Instructions (Signed)
Medication Instructions: - none  Labwork: - none  Procedures/Testing: - none  Follow-Up: - pending your decision  Any Additional Special Instructions Will Be Listed Below (If Applicable). - Dr. Olin Pia nurse, Nira Conn, will call you back mid to end of the week next week to see if you have decided on how you would like to proceed.    If you need a refill on your cardiac medications before your next appointment, please call your pharmacy.

## 2015-08-03 NOTE — Progress Notes (Signed)
Was review    Electrophysiology Office Note   Date:  08/03/2015   ID:  Ernest [name], DOB 1936-02-09, MRN [id number]  PCP:  [name], MD  Cardiologist:  [id number] Primary Electrophysiologist:   Virl Axe, MD    Chief Complaint  Patient presents with  . Annual Exam     History of Present Illness: Ernest [name] is a 80 y.o. male seen in electrophysiology followup  for  atrial fibrillation-paroxysmal with rapid ventricular response associated with sinus bradycardia. He has a history of coronary disease. He is managed with apixaban.  We have discussed dofetilide and amiodarone and pacing as various contributors to a strategy. At our last visit 12/15 the decision was made to continue the current strategy using when necessary blockers for more rapid heart rates     DATE TEST    9/16 echo     [id number]  mild LAE         E     Since then he has had frequent interval recurrences of his atrial fibrillation. They have been associated with chest pain palpitations and some shortness of breath. He is taking Cardizem when necessary with increased metoprolol from 25 twice a day--3 times a day  he continues to have problems with tachycardia as well as bradycardia. We discussed the role of amiodarone with concerns related to bradycardia as well as the use of dofetilide. At last time his QTC was okay   He continues to have relatively frequent spells of tachypalpitations associated with shortness of breath and lightheadedness.        Past Medical History  Diagnosis Date  . Thyroid disease   . Hyperlipidemia   . BPH (benign prostatic hypertrophy)   . Diabetes mellitus without complication ([place])   . Essential hypertension   . Aortic atherosclerosis ([place])   . PVD (peripheral vascular disease) ([place])   . Stroke ([place])   . PAF (paroxysmal atrial fibrillation) ([place])     a. CHA2DS2VASc = 7-->eliquis;  b. 03/2015 Echo: EF 55-60%, Gr1 DD, mild MR, mildly dil LA, PASP 75mmHg.  Marland Kitchen Coronary  artery disease     a. Cath 12/2013->[facility]->Med rx.  Marland Kitchen GERD (gastroesophageal reflux disease)   . Pneumonia     as a child  . Arthritis   . Rosacea   . 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 Deviated nasal septum   . Hearing deficit     wears hearing aids   Past Surgical History  Procedure Laterality Date  . Colonoscopy    . Prostate biopsy    . Cardiac catheterization      [facility] 12/24/13: 30% LM , pLAD, ostial CX. 20% mLAD, mRCA. 70% OM2 (small vessel). Med RX.   Marland Kitchen Cardiac catheterization      [facility]  . Cardiac catheterization      [facility]  . Cataract extraction w/ intraocular lens implant      right  . Multiple tooth extractions    . Lumbar laminectomy/decompression microdiscectomy N/A 01/04/2015    Procedure: LUMBAR LAMINECTOMY/DECOMPRESSION MICRODISCECTOMY 3 LEVELS;  Surgeon: Newman Pies, MD;  Location: [place] NEURO ORS;  Service: Neurosurgery;  Laterality: N/A;  L23 L34 L45 laminectomies and foraminotomies     Current Outpatient Prescriptions  Medication Sig Dispense Refill  . apixaban (ELIQUIS) 5 MG TABS tablet Take 1 tablet (5 mg total) by mouth 2 (two) times daily. 60 tablet 5  . doxycycline (VIBRAMYCIN) 100 MG capsule Take 100 mg by mouth  daily.    . levothyroxine (SYNTHROID, LEVOTHROID) 112 MCG tablet Take 112 mcg by mouth daily before breakfast.    . lovastatin (MEVACOR) 40 MG tablet Take 40 mg by mouth at bedtime.     . meclizine (ANTIVERT) 25 MG tablet Take 25 mg by mouth 3 (three) times daily as needed. Use up to 10 days for dizziness  0  . metFORMIN (GLUCOPHAGE) 500 MG tablet Take 500 mg by mouth 2 (two) times daily with a meal.     . metoprolol tartrate (LOPRESSOR) 25 MG tablet Take 1 & 1/2 tablets (37.5 mg) by mouth twice daily 135 tablet 3  . metronidazole (NORITATE) 1 % cream Apply 1 application topically daily.    . Multiple Vitamin (MULTIVITAMIN) tablet Take 1 tablet by mouth daily.    . tamsulosin (FLOMAX) 0.4 MG CAPS capsule Take 0.4 mg by mouth 2 (two)  times daily.     No current facility-administered medications for this visit.    Allergies:   Review of patient's allergies indicates no known allergies.   Social History:  The patient  reports that he has quit smoking. His smoking use included Cigarettes. He has never used smokeless tobacco. He reports that he does not drink alcohol or use illicit drugs.   Family History:  The patient's family history includes Cancer in his brother; Cancer - Lung in his brother, brother, and sister; Diabetes in his sister; Heart attack in his father and mother.    ROS:  Please see the history of present illness.   Otherwise, review of systems is negative .    PHYSICAL EXAM: VS:  BP 106/50 mmHg  Pulse 55  Ht 5\' 7"  (1.702 m)  Wt 168 lb 12.8 oz (76.567 kg)  BMI 26.43 kg/m2 , BMI Body mass index is 26.43 kg/(m^2). GEN: Well nourished, well developed, in no acute distress HEENT: normal Neck: JVD flat,   Cardiac: RRR;  no murmur  rubs, + S4  Respiratory:  clear to auscultation bilaterally, normal work of breathing Back without kyphosis or CVAT GI: soft, nontender, nondistended, + BS MS: no deformity or atrophy Skin: warm and dry,  Extremities No Clubbing cyanosis  Edema Neuro:  Strength and sensation are intact Psych: euthymic mood, full affect  EKG:  EKG is ordered today. The ekg ordered today shows sinus rhythm at 58 Interval 17/09/39 Axis LX    Recent Labs: 06/20/2015: ALT 23; BUN 20; Creatinine, Ser 0.74; Hemoglobin 14.9; Platelets 187; Potassium 4.4; Sodium 137    Lipid Panel     Component Value Date/Time   CHOL 128 08/11/2011 0448   TRIG 160 08/11/2011 0448   HDL 33* 08/11/2011 0448   VLDL 32 08/11/2011 0448   LDLCALC 63 08/11/2011 0448     Wt Readings from Last 3 Encounters:  08/03/15 168 lb 12.8 oz (76.567 kg)  06/22/15 174 lb 8 oz (79.153 kg)  06/20/15 165 lb (74.844 kg)      Other studies Reviewed:  ER records and labs   ASSESSMENT AND PLAN:  Atrial  fibrillation-paroxysmal  Coronary artery disease  Prior stroke  Sinus bradycardia  Hypotension  Without symptoms of ischemia  He continues to have problems with tachycardia as well as bradycardia. His wife brings in numerous sheets of drug side effects. We have discussed for alternative strategy. The first is continue as we are, the second is antiarrhythmic therapy probably with dofetilide so as to avoid pacing, the third is pacing plus AV junction ablation,   4 for  the referral for pulmonary vein isolation.  Augmented rate control as a challenge with his already slow sinus rate and his low blood pressure. Even the presence of pacing would not allow up titrated AV nodal blockade given his low blood pressure.  At least in the context of patients with heart failure, #4 is preferred to #3. In him however, at his age and with mild dementia I am not sure that this data would apply. It is his preference at this juncture to pursue pacing which is most definitive. He and his wife both understand that would render him device dependent. With his normal LV function, CRT would not be indicated.  He is not entirely sure this juncture so we will plan to call next week and see what he and his wife decided.  With his history of TIA this would all be done on anticoagulation  Labs/ tests ordered today include:    No orders of the defined types were placed in this encounter.     Disposition:   We will call him next week   Signed, Virl Axe, MD  08/03/2015 11:25 AM     [address] [place] [address] [phone] (office) [phone] (fax) we'll

## 2015-08-09 ENCOUNTER — Telehealth: Payer: Self-pay | Admitting: *Deleted

## 2015-08-09 ENCOUNTER — Other Ambulatory Visit: Payer: Self-pay | Admitting: *Deleted

## 2015-08-09 MED ORDER — APIXABAN 5 MG PO TABS: 5.0000 mg | ORAL_TABLET | Freq: Two times a day (BID) | ORAL | Status: DC

## 2015-08-09 NOTE — Telephone Encounter (Signed)
°*  STAT* If patient is at the pharmacy, call can be transferred to refill team.   1. Which medications need to be refilled? (please list name of each medication and dose if known) Eliquis   2. Which pharmacy/location (including street and city if local pharmacy) is medication to be sent to? Send main order to [facility] but pt will be out soon please send a weeks worth to [facility] on [address]   3. Do they need a 30 day or 90 day supply? 90 day

## 2015-08-09 NOTE — Telephone Encounter (Signed)
Requested Prescriptions   Signed Prescriptions Disp Refills  . apixaban (ELIQUIS) 5 MG TABS tablet 14 tablet 1    Sig: Take 1 tablet (5 mg total) by mouth 2 (two) times daily.    Authorizing Provider: Deboraha Sprang    Ordering User: Britt Bottom

## 2015-08-10 ENCOUNTER — Telehealth: Payer: Self-pay | Admitting: Internal Medicine

## 2015-08-10 NOTE — Telephone Encounter (Signed)
Pt presented to front desk asking to speak w/ Nira Conn about setting up a procedure. Pt reports that he is having active chest pain, but this is something that has been coming and going for the past 6 months. Advised pt that I will wheel him to the ED, but he declines. Spoke w/ pt's wife and advised her of my recommendation.  Pt states that his pain is easing off, that he will consider going to Urgent Care, but he will not go to ED. Pt denies radiation of pain, SOB or n/v. Advised pt that he does have the right to refuse treatment, but I recommend he be evaluated. Pt is appreciative, but states that he will go home and have his wife call 911 if his sx recur or become emergent.  Advised wife not to let pt drive home, but she states that he will not let her drive.  Advised pt that I will make Dr. Caryl Comes aware and have Nira Conn call him back to discuss setting up pacer implant.

## 2015-08-10 NOTE — Telephone Encounter (Signed)
RN mandi spoke with patient and suggest going to ed .   Wants to talk to heather about fu from last visit and scheduling pacemaker.

## 2015-08-10 NOTE — Telephone Encounter (Signed)
Pt c/o of Chest Pain: STAT if CP now or developed within 24 hours  1. Are you having CP right now?  Yes   Center chest   2. Are you experiencing any other symptoms (ex. SOB, nausea, vomiting, sweating)?   3. How long have you been experiencing CP?  Since this morning   4. Is your CP continuous or coming and going? Comes and goes ; continuous for last 30 min    5. Have you taken Nitroglycerin?  No do not have any ?

## 2015-08-11 ENCOUNTER — Encounter: Payer: Self-pay | Admitting: *Deleted

## 2015-08-11 ENCOUNTER — Emergency Department: Payer: PPO

## 2015-08-11 ENCOUNTER — Emergency Department
Admission: EM | Admit: 2015-08-11 | Discharge: 2015-08-11 | Disposition: A | Discharge status: Home / Self Care | Payer: PPO | Attending: Student | Admitting: Student

## 2015-08-11 DIAGNOSIS — E119 Type 2 diabetes mellitus without complications: Secondary | ICD-10-CM | POA: Insufficient documentation

## 2015-08-11 DIAGNOSIS — I10 Essential (primary) hypertension: Secondary | ICD-10-CM | POA: Insufficient documentation

## 2015-08-11 DIAGNOSIS — Z7984 Long term (current) use of oral hypoglycemic drugs: Secondary | ICD-10-CM | POA: Insufficient documentation

## 2015-08-11 DIAGNOSIS — Z792 Long term (current) use of antibiotics: Secondary | ICD-10-CM | POA: Diagnosis not present

## 2015-08-11 DIAGNOSIS — Z87891 Personal history of nicotine dependence: Secondary | ICD-10-CM | POA: Insufficient documentation

## 2015-08-11 DIAGNOSIS — Z959 Presence of cardiac and vascular implant and graft, unspecified: Secondary | ICD-10-CM | POA: Insufficient documentation

## 2015-08-11 DIAGNOSIS — R079 Chest pain, unspecified: Secondary | ICD-10-CM | POA: Insufficient documentation

## 2015-08-11 DIAGNOSIS — I2510 Atherosclerotic heart disease of native coronary artery without angina pectoris: Secondary | ICD-10-CM | POA: Diagnosis not present

## 2015-08-11 DIAGNOSIS — R42 Dizziness and giddiness: Secondary | ICD-10-CM | POA: Insufficient documentation

## 2015-08-11 DIAGNOSIS — Z79899 Other long term (current) drug therapy: Secondary | ICD-10-CM | POA: Insufficient documentation

## 2015-08-11 LAB — CBC WITH DIFFERENTIAL/PLATELET
Basophils Absolute: 0.1 10*3/uL (ref 0–0.1)
Basophils Relative: 1 %
EOS ABS: 0.2 10*3/uL (ref 0–0.7)
EOS PCT: 2 %
HCT: 45.2 % (ref 40.0–52.0)
Hemoglobin: 15.4 g/dL (ref 13.0–18.0)
LYMPHS ABS: 2.2 10*3/uL (ref 1.0–3.6)
Lymphocytes Relative: 19 %
MCH: 30.1 pg (ref 26.0–34.0)
MCHC: 34.1 g/dL (ref 32.0–36.0)
MCV: 88.2 fL (ref 80.0–100.0)
MONO ABS: 1 10*3/uL (ref 0.2–1.0)
Monocytes Relative: 9 %
Neutro Abs: 8.2 10*3/uL — ABNORMAL HIGH (ref 1.4–6.5)
Neutrophils Relative %: 69 %
PLATELETS: 168 10*3/uL (ref 150–440)
RBC: 5.12 MIL/uL (ref 4.40–5.90)
RDW: 13 % (ref 11.5–14.5)
WBC: 11.7 10*3/uL — ABNORMAL HIGH (ref 3.8–10.6)

## 2015-08-11 LAB — BASIC METABOLIC PANEL
Anion gap: 6 (ref 5–15)
BUN: 18 mg/dL (ref 6–20)
CALCIUM: 9.9 mg/dL (ref 8.9–10.3)
CO2: 23 mmol/L (ref 22–32)
CREATININE: 0.91 mg/dL (ref 0.61–1.24)
Chloride: 107 mmol/L (ref 101–111)
GFR calc Af Amer: >60 mL/min (ref >60)
GLUCOSE: 249 mg/dL — AB (ref 65–99)
POTASSIUM: 4.6 mmol/L (ref 3.5–5.1)
SODIUM: 136 mmol/L (ref 135–145)

## 2015-08-11 LAB — TSH: TSH: 1.944 u[IU]/mL (ref 0.350–4.500)

## 2015-08-11 LAB — T4, FREE: Free T4: 1.06 ng/dL (ref 0.61–1.12)

## 2015-08-11 LAB — TROPONIN I

## 2015-08-11 MED ORDER — SODIUM CHLORIDE 0.9 % IV BOLUS (SEPSIS)
500.0000 mL | Freq: Once | INTRAVENOUS | Status: AC
  Administered 2015-08-11: 500 mL via INTRAVENOUS

## 2015-08-11 MED ORDER — ASPIRIN 81 MG PO CHEW
324.0000 mg | CHEWABLE_TABLET | Freq: Once | ORAL | Status: AC
  Administered 2015-08-11: 324 mg via ORAL
  Filled 2015-08-11: qty 4

## 2015-08-11 NOTE — ED Provider Notes (Signed)
[facility] Emergency Department Provider Note  ____________________________________________  Time seen: Approximately 12:36 PM  I have reviewed the triage vital signs and the nursing notes.   HISTORY  Chief Complaint Chest Pain    HPI Ernest [name] is a 80 y.o. male with history of fibrillation on elliquis and metoprolol, thyroid disease, retention and diabetes who presents for evaluation of 3 days of intermittent chest soreness, gradual onset, intermittent, currently resolved. The patient reports that he will often have some chest soreness which is sometimes associated with  rapid heart rate. He has known atrial fibrillation, sometimes with rapid rate and is awaiting pacemaker placement for help with definitive rate control. He is a patient of Dr. Olin Pia. This morning he noted that his heart rate was in the 150s, soon after that he developed central chest soreness. The pain is nonradiating, is not worse with exertion, not associated with any shortness of breath, nausea, diaphoresis. No recent illness including no coughing, vomiting, diarrhea, fevers or chills, no abdominal pain. He does also endorse some positional lightheadedness but no room spinning dizziness, no headache, numbness or weakness.   Past Medical History  Diagnosis Date  . Thyroid disease   . Hyperlipidemia   . BPH (benign prostatic hypertrophy)   . Diabetes mellitus without complication ([place])   . Essential hypertension   . Aortic atherosclerosis ([place])   . PVD (peripheral vascular disease) ([place])   . Stroke ([place])   . PAF (paroxysmal atrial fibrillation) ([place])     a. CHA2DS2VASc = 7-->eliquis;  b. 03/2015 Echo: EF 55-60%, Gr1 DD, mild MR, mildly dil LA, PASP 34mmHg.  Marland Kitchen Coronary artery disease     a. Cath 12/2013->[facility]->Med rx.  Marland Kitchen GERD (gastroesophageal reflux disease)   . Pneumonia     as a child  . Arthritis   . Rosacea   . 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 Deviated nasal  septum   . Hearing deficit     wears hearing aids    Patient Active Problem List   Diagnosis Date Noted  . PAF (paroxysmal atrial fibrillation) ([place])   . Coronary artery disease   . Essential hypertension   . Hypertension 03/11/2015  . Type 2 diabetes mellitus ([place]) 03/11/2015  . GERD (gastroesophageal reflux disease) 03/11/2015  . Hyperlipidemia 03/11/2015  . BPH (benign prostatic hyperplasia) 03/11/2015  . Atrial fibrillation with rapid ventricular response ([place]) 03/11/2015  . Atrial fibrillation with RVR ([place]) 03/10/2015  . Lumbar stenosis with neurogenic claudication 01/04/2015    Past Surgical History  Procedure Laterality Date  . Colonoscopy    . Prostate biopsy    . Cardiac catheterization      [facility] 12/24/13: 30% LM , pLAD, ostial CX. 20% mLAD, mRCA. 70% OM2 (small vessel). Med RX.   Marland Kitchen Cardiac catheterization      [facility]  . Cardiac catheterization      [facility]  . Cataract extraction w/ intraocular lens implant      right  . Multiple tooth extractions    . Lumbar laminectomy/decompression microdiscectomy N/A 01/04/2015    Procedure: LUMBAR LAMINECTOMY/DECOMPRESSION MICRODISCECTOMY 3 LEVELS;  Surgeon: Newman Pies, MD;  Location: [place] NEURO ORS;  Service: Neurosurgery;  Laterality: N/A;  L23 L34 L45 laminectomies and foraminotomies    Current Outpatient Rx  Name  Route  Sig  Dispense  Refill  . apixaban (ELIQUIS) 5 MG TABS tablet   Oral   Take 1 tablet (5 mg total) by mouth 2 (  two) times daily.   14 tablet   1   . doxycycline (VIBRAMYCIN) 100 MG capsule   Oral   Take 100 mg by mouth daily.         Marland Kitchen levothyroxine (SYNTHROID, LEVOTHROID) 112 MCG tablet   Oral   Take 112 mcg by mouth daily before breakfast.         . lovastatin (MEVACOR) 40 MG tablet   Oral   Take 40 mg by mouth at bedtime.          . meclizine (ANTIVERT) 25 MG tablet   Oral   Take 25 mg by mouth 3 (three) times daily as needed. Use up to 10 days for dizziness      0   . metFORMIN  (GLUCOPHAGE) 500 MG tablet   Oral   Take 500 mg by mouth 2 (two) times daily with a meal.          . metoprolol tartrate (LOPRESSOR) 25 MG tablet      Take 1 & 1/2 tablets (37.5 mg) by mouth twice daily   135 tablet   3   . metronidazole (NORITATE) 1 % cream   Topical   Apply 1 application topically daily.         . Multiple Vitamin (MULTIVITAMIN) tablet   Oral   Take 1 tablet by mouth daily.         . tamsulosin (FLOMAX) 0.4 MG CAPS capsule   Oral   Take 0.4 mg by mouth 2 (two) times daily.           Allergies Review of patient's allergies indicates no known allergies.  Family History  Problem Relation Age of Onset  . Heart attack Mother   . Heart attack Father   . Cancer - Lung Sister   . Cancer - Lung Brother   . Cancer - Lung Brother   . Cancer Brother   . Diabetes Sister     Social History Social History  Substance Use Topics  . Smoking status: Former Smoker    Types: Cigarettes  . Smokeless tobacco: Never Used     Comment: " Quit smoking cigarettes in 1983 "  . Alcohol Use: No    Review of Systems Constitutional: No fever/chills Eyes: No visual changes. ENT: No sore throat. Cardiovascular: + chest pain. Respiratory: Denies shortness of breath. Gastrointestinal: No abdominal pain.  No nausea, no vomiting.  No diarrhea.  No constipation. Genitourinary: Negative for dysuria. Musculoskeletal: Negative for back pain. Skin: Negative for rash. Neurological: Negative for headaches, focal weakness or numbness.  10-point ROS otherwise negative.  ____________________________________________   PHYSICAL EXAM:  VITAL SIGNS: ED Triage Vitals  Enc Vitals Group     BP 08/11/15 1227 161/110 mmHg     Pulse Rate 08/11/15 1227 71     Resp 08/11/15 1227 20     Temp 08/11/15 1227 98 F (36.7 C)     Temp Source 08/11/15 1227 Oral     SpO2 08/11/15 1227 97 %     Weight 08/11/15 1227 170 lb (77.111 kg)     Height 08/11/15 1227 5\' 7"  (1.702 m)      Head Cir --      Peak Flow --      Pain Score 08/11/15 1227 5     Pain Loc --      Pain Edu? --      Excl. in [place]? --     Constitutional: Alert and oriented. Nontoxic appearing and  in no acute distress. Eyes: Conjunctivae are normal. PERRL. EOMI. Head: Atraumatic. Nose: No congestion/rhinnorhea. Mouth/Throat: Mucous membranes are moist.  Oropharynx non-erythematous. Neck: No stridor.   Cardiovascular: Normal rate, regular rhythm. Grossly normal heart sounds.  Good peripheral circulation. Respiratory: Normal respiratory effort.  No retractions. Lungs CTAB. Gastrointestinal: Soft and nontender. No distention. No CVA tenderness. Genitourinary: deferred Musculoskeletal: No lower extremity tenderness nor edema.  No joint effusions. Neurologic:  Normal speech and language. No gross focal neurologic deficits are appreciated. No gait instability. Skin:  Skin is warm, dry and intact. No rash noted. Psychiatric: Mood and affect are normal. Speech and behavior are normal.  ____________________________________________   LABS (all labs ordered are listed, but only abnormal results are displayed)  Labs Reviewed  CBC WITH DIFFERENTIAL/PLATELET - Abnormal; Notable for the following:    WBC 11.7 (*)    Neutro Abs 8.2 (*)    All other components within normal limits  BASIC METABOLIC PANEL - Abnormal; Notable for the following:    Glucose, Bld 249 (*)    All other components within normal limits  TROPONIN I  TSH  T4, FREE  TROPONIN I   ____________________________________________  EKG  ED ECG REPORT I, Joanne Gavel, the attending physician, personally viewed and interpreted this ECG.   Date: 08/11/2015  EKG Time: 12:21  Rate: 69  Rhythm: normal sinus rhythm  Axis: normal  Intervals:none  ST&T Change: No acute ST elevation. No Q waves. No T-wave inversions. Normal intervals.  ____________________________________________  RADIOLOGY  CXR IMPRESSION: 1. Chronic biapical  scarring similar to prior exams. Questionable left upper lateral rib deformities 2. Aortoiliac atherosclerotic vascular disease. 3. Airway thickening is present, suggesting bronchitis or reactive airways disease. ____________________________________________   PROCEDURES  Procedure(s) performed: None  Critical Care performed: No  ____________________________________________   INITIAL IMPRESSION / ASSESSMENT AND PLAN / ED COURSE  Pertinent labs & imaging results that were available during my care of the patient were reviewed by me and considered in my medical decision making (see chart for details).  Ernest [name] is a 80 y.o. male with history of fibrillation on elliquis and metoprolol, thyroid disease, retention and diabetes who presents for evaluation of 3 days of intermittent chest soreness, really resolved. On exam, he is nontoxic appearing. Vital signs stable, he is afebrile. EKG shows normal sinus rhythm, no evidence of acute ischemia. Chest pain somewhat atypical for ACS, given no exertional component, no associated symptoms. Suspect his pain may be secondary to demand ischemia in the setting of intermittent age of fibrillation with rapid ventricular rate. Doubt PE or acute aortic dissection. Plan for screening cardiac labs, chest x-ray, will discuss case with his cardiologist.  ----------------------------------------- 2:45 PM on 08/11/2015 ----------------------------------------- Labs reviewed and are notable for mild leukocytosis with white blood cell count 11.7. BMP generally unremarkable. Negative troponin, normal thyroid studies. Chest x-ray shows possible reactive airway disease but no other acute abnormalities. Patient remains chest pain-free in normal sinus rhythm. I discussed the case with Dr. Ellyn Hack, on call for Dr. Caryl Comes as well as cardiology PA Melvyn Neth and the patient will be seen by Dr. Caryl Comes in clinic tomorrow at 10:30 AM. Dr. Ellyn Hack recommends second  troponin which we will check. If the patient continues to be asymptomatic, anticipate discharge with close cardiology follow up tomorrow.  ----------------------------------------- 3:49 PM on 08/11/2015 -----------------------------------------  Second troponin negative. Patient remains chest pain-free and still in sinus rhythm. We discussed return precautions, need for close follow-up tomorrow with Dr. Caryl Comes  and he and his wife at bedside are comfortable with the discharge plan. DC home.  ____________________________________________   FINAL CLINICAL IMPRESSION(S) / ED DIAGNOSES  Final diagnoses:  Chest pain, unspecified chest pain type      Joanne Gavel, MD 08/11/15 1550

## 2015-08-11 NOTE — Progress Notes (Signed)
  I spoke with the patient and his wife while they were in the [facility] Emergency Department. The patient reported having intermittent episodes of chest discomfort and feeling his heart racing ever since his appointment with Dr. Caryl Comes last week. He reports his symptoms peaked yesterday and his HR was in the 150's at that time. His BP was in the low-100's so his wife did not give an additional dose of Metoprolol at that time. She says they have discussed pacemaker placement and agreed earlier this week that was the option they wish to pursue.   At the time of this encounter, the patient denies any chest discomfort. Says his HR was racing until they pulled into the parking lot, then subsided. His initial SBP was in the 90's but upon being given a 515mL NS bolus, his SBP is at 108 currently. His EKG showed NSR with HR of 69. Initial troponin value is negative.  The case was discussed with Dr. Ellyn Hack who recommended a repeat troponin value and close follow-up with Dr. Caryl Comes. He has an appointment scheduled with Dr. Caryl Comes on 08/12/2015 at 10:30AM.  Signed, Erma Heritage, PA-C 08/11/2015, 2:48 PM Pager: [phone]

## 2015-08-11 NOTE — ED Notes (Signed)
Pt has a history of A-fib; awaiting a pacemaker placement, pt reports having episodes of heart racing with A-fib with dizziness, pt complains of central chest pressure

## 2015-08-11 NOTE — Telephone Encounter (Signed)
Dr. Caryl Comes is aware patient was seen in the [facility] ER. OK to add on the schedule in [place] tomorrow. Izora Gala is aware.

## 2015-08-11 NOTE — Telephone Encounter (Signed)
The patient is currently in the ER at [facility]. Will notify Dr. Caryl Comes.

## 2015-08-12 ENCOUNTER — Encounter: Payer: Self-pay | Admitting: Internal Medicine

## 2015-08-12 ENCOUNTER — Encounter: Payer: Self-pay | Admitting: *Deleted

## 2015-08-12 ENCOUNTER — Ambulatory Visit (INDEPENDENT_AMBULATORY_CARE_PROVIDER_SITE_OTHER): Payer: PPO | Admitting: Internal Medicine

## 2015-08-12 VITALS — BP 100/64 | HR 56 | Ht 67.0 in | Wt 169.0 lb

## 2015-08-12 DIAGNOSIS — R001 Bradycardia, unspecified: Secondary | ICD-10-CM | POA: Diagnosis not present

## 2015-08-12 DIAGNOSIS — I480 Paroxysmal atrial fibrillation: Secondary | ICD-10-CM

## 2015-08-12 NOTE — Patient Instructions (Signed)
Medication Instructions: - Your physician recommends that you continue on your current medications as directed. Please refer to the Current Medication list given to you today.  Labwork: - none  Procedures/Testing: - Your physician has recommended that you have a pacemaker inserted. A pacemaker is a small device that is placed under the skin of your chest or abdomen to help control abnormal heart rhythms. This device uses electrical pulses to prompt the heart to beat at a normal rate. Pacemakers are used to treat heart rhythms that are too slow. Wire (leads) are attached to the pacemaker that goes into the chambers of you heart. This is done in the hospital and usually requires and overnight stay. Please see the instruction sheet given to you today for more information.  Follow-Up: - Your physician recommends that you schedule a follow-up appointment in: 10-14 days (from 08/16/15) for a wound check with the [facility] in [place].  - Your physician recommends that you schedule a follow-up appointment in: 6 weeks with Dr. Caryl Comes.   Any Additional Special Instructions Will Be Listed Below (If Applicable).     If you need a refill on your cardiac medications before your next appointment, please call your pharmacy.

## 2015-08-12 NOTE — Progress Notes (Signed)
Electrophysiology Office Note   Date:  08/12/2015   ID:  Ernest [name], DOB 1935-12-31, MRN [id number]  PCP:  Ernest Fennel, MD  Cardiologist:  [id number] Primary Electrophysiologist:   Ernest Axe, MD    Chief Complaint  Patient presents with  . other    F/u ED due to chest pain. Meds reviewed verbally with pt.     History of Present Illness: Ernest [name] is a 80 y.o. male seen in electrophysiology followup  for  atrial fibrillation-paroxysmal with rapid ventricular response associated with sinus bradycardia. He has a history of coronary disease. He is managed with apixaban.  We have discussed dofetilide and amiodarone and pacing as various contributors to a strategy. At our last visit 12/15 the decision was made to continue the current strategy using when necessary blockers for more rapid heart rates     DATE TEST    9/16 echo     EF55-60  mild LAE              He was seen a couple of weeks ago. He continued to complain of tachypalpitations as well as bradycardia with symptoms of lightheadedness fatigue AND chest pain. We discussed options at that time including maintaining current course, antiarrhythmic therapy, AV junction ablation and pacing, pulmonary vein isolation. The concern was blood pressure and heart rate both limited up titration of rate controlling medications. In the interim he was seen in the emergency room at [facility].  Serial troponins were negative. It was felt that his chest pain might be related to his rapid atrial fibrillation.   He is here to review options  His chart describes catheterization 2015. Was associated with "medical therapy". Have not been able to find the report.  We have just been able to get the cath report. It demonstrated 70% mid marginal as well as 20-30% disease diffusely.  Past Medical History  Diagnosis Date  . Thyroid disease   . Hyperlipidemia   . BPH (benign prostatic hypertrophy)   . Diabetes mellitus without  complication ([place])   . Essential hypertension   . Aortic atherosclerosis ([place])   . PVD (peripheral vascular disease) ([place])   . Stroke ([place])   . PAF (paroxysmal atrial fibrillation) ([place])     a. CHA2DS2VASc = 7-->eliquis;  b. 03/2015 Echo: EF 55-60%, Gr1 DD, mild MR, mildly dil LA, PASP 52mmHg.  Marland Kitchen Coronary artery disease     a. Cath 12/2013->[facility]->Med rx.  Marland Kitchen GERD (gastroesophageal reflux disease)   . Pneumonia     as a child  . Arthritis   . Rosacea   . 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 Deviated nasal septum   . Hearing deficit     wears hearing aids   Past Surgical History  Procedure Laterality Date  . Colonoscopy    . Prostate biopsy    . Cardiac catheterization      [facility] 12/24/13: 30% LM , pLAD, ostial CX. 20% mLAD, mRCA. 70% OM2 (small vessel). Med RX.   Marland Kitchen Cardiac catheterization      [facility]  . Cardiac catheterization      [facility]  . Cataract extraction w/ intraocular lens implant      right  . Multiple tooth extractions    . Lumbar laminectomy/decompression microdiscectomy N/A 01/04/2015    Procedure: LUMBAR LAMINECTOMY/DECOMPRESSION MICRODISCECTOMY 3 LEVELS;  Surgeon: Newman Pies, MD;  Location: [place] [facility]UR[facility] ORS;  Service: Neurosurgery;  Laterality: N/A;  L23 L34 L45 laminectomies  and foraminotomies     Current Outpatient Prescriptions  Medication Sig Dispense Refill  . apixaban (ELIQUIS) 5 MG TABS tablet Take 1 tablet (5 mg total) by mouth 2 (two) times daily. 14 tablet 1  . doxycycline (VIBRAMYCIN) 100 MG capsule Take 100 mg by mouth daily.    Marland Kitchen levothyroxine (SYNTHROID, LEVOTHROID) 112 MCG tablet Take 112 mcg by mouth daily before breakfast.    . lovastatin (MEVACOR) 40 MG tablet Take 40 mg by mouth at bedtime.     . meclizine (ANTIVERT) 25 MG tablet Take 25 mg by mouth 3 (three) times daily as needed. Use up to 10 days for dizziness  0  . metFORMIN (GLUCOPHAGE) 500 MG tablet Take 500 mg by mouth 2 (two) times daily with a meal.     . metoprolol tartrate  (LOPRESSOR) 25 MG tablet Take 1 & 1/2 tablets (37.5 mg) by mouth twice daily 135 tablet 3  . metronidazole (NORITATE) 1 % cream Apply 1 application topically daily.    . Multiple Vitamin (MULTIVITAMIN) tablet Take 1 tablet by mouth daily.    . tamsulosin (FLOMAX) 0.4 MG CAPS capsule Take 0.4 mg by mouth 2 (two) times daily.     No current facility-administered medications for this visit.    Allergies:   Review of patient's allergies indicates no known allergies.   Social History:  The patient  reports that he has quit smoking. His smoking use included Cigarettes. He has never used smokeless tobacco. He reports that he does not drink alcohol or use illicit drugs.   Family History:  The patient's family history includes Cancer in his brother; Cancer - Lung in his brother, brother, and sister; Diabetes in his sister; Heart attack in his father and mother.    ROS:  Please see the history of present illness.   Otherwise, review of systems is negative .    PHYSICAL EXAM: VS:  BP 100/64 mmHg  Pulse 56  Ht 5\' 7"  (1.702 m)  Wt 169 lb (76.658 kg)  BMI 26.46 kg/m2 , BMI Body mass index is 26.46 kg/(m^2). GEN: Well nourished, well developed, in no acute distress HEENT: normal Neck: JVD flat,   Cardiac: RRR;  no murmur  rubs, + S4  Respiratory:  clear to auscultation bilaterally, normal work of breathing Back without kyphosis or CVAT GI: soft, nontender, nondistended, + BS MS: no deformity or atrophy Skin: warm and dry,  Extremities No Clubbing cyanosis  Edema Neuro:  Strength and sensation are intact Psych: euthymic mood, full affect  EKG:  EKG is ordered today. The ekg ordered today shows sinus rhythm at 58 Interval 17/09/39 Axis LX    Recent Labs: 06/20/2015: ALT 23 08/11/2015: BUN 18; Creatinine, Ser 0.91; Hemoglobin 15.4; Platelets 168; Potassium 4.6; Sodium 136; TSH 1.944    Lipid Panel     Component Value Date/Time   CHOL 128 08/11/2011 0448   TRIG 160 08/11/2011 0448    HDL 33* 08/11/2011 0448   VLDL 32 08/11/2011 0448   LDLCALC 63 08/11/2011 0448     Wt Readings from Last 3 Encounters:  08/12/15 169 lb (76.658 kg)  08/11/15 170 lb (77.111 kg)  08/03/15 168 lb 12.8 oz (76.567 kg)      Other studies Reviewed:  ER records and labs   ASSESSMENT AND PLAN:  Atrial fibrillation-paroxysmal  Coronary artery disease  Prior stroke  Sinus bradycardia  Hypotension   The saga of yesterday was reviewed with the medical record is with the patient.  He has a the options quite clear to him and to his wife. They would like to proceed with pacemaker implantation. That offers 3 further options. #1 AV nodal blockade; I suspect this will not be useful given his low blood pressure. #2-antiarrhythmic therapy with the hopes that maintaining sinus rhythm will protect him from ventricular pacing and tachycardia associated chest pain and #3-AV junction ablation. We will pursue #2. Once we have the catheterization information will decide between a 1C agent and a class III agent.  Based on his catheterization report, we will plan to avoid the 1C agent and use a class III. I will discuss with the family dofetilide versus amiodarone. His QTC is about 370 ms.  With his history of TIA we will do his procedure with his anticoagulation. The benefits and risks were reviewed including but not limited to death,  perforation, infection, lead dislodgement and device malfunction.  The patient understands agrees and is willing to proceed.   Thank you Labs/ tests ordered today include:      Disposition:   We will call him next week   Signed, Ernest Axe, MD  08/12/2015 11:04 AM     [facility] [address] [place] [address] [phone] (office) [phone] (fax) we'll

## 2015-08-16 ENCOUNTER — Ambulatory Visit (HOSPITAL_COMMUNITY)
Admission: RE | Admit: 2015-08-16 | Discharge: 2015-08-17 | Disposition: A | Discharge status: Home / Self Care | Payer: PPO | Source: Ambulatory Visit | Attending: Internal Medicine | Admitting: Internal Medicine

## 2015-08-16 ENCOUNTER — Encounter (HOSPITAL_COMMUNITY)
Admission: RE | Disposition: A | Discharge status: Home / Self Care | Payer: PPO | Source: Ambulatory Visit | Attending: Internal Medicine

## 2015-08-16 DIAGNOSIS — I495 Sick sinus syndrome: Secondary | ICD-10-CM | POA: Insufficient documentation

## 2015-08-16 DIAGNOSIS — R001 Bradycardia, unspecified: Secondary | ICD-10-CM | POA: Diagnosis present

## 2015-08-16 DIAGNOSIS — K219 Gastro-esophageal reflux disease without esophagitis: Secondary | ICD-10-CM | POA: Insufficient documentation

## 2015-08-16 DIAGNOSIS — I2510 Atherosclerotic heart disease of native coronary artery without angina pectoris: Secondary | ICD-10-CM | POA: Diagnosis not present

## 2015-08-16 DIAGNOSIS — I739 Peripheral vascular disease, unspecified: Secondary | ICD-10-CM | POA: Insufficient documentation

## 2015-08-16 DIAGNOSIS — N400 Enlarged prostate without lower urinary tract symptoms: Secondary | ICD-10-CM | POA: Diagnosis not present

## 2015-08-16 DIAGNOSIS — Z8673 Personal history of transient ischemic attack (TIA), and cerebral infarction without residual deficits: Secondary | ICD-10-CM | POA: Insufficient documentation

## 2015-08-16 DIAGNOSIS — E079 Disorder of thyroid, unspecified: Secondary | ICD-10-CM | POA: Insufficient documentation

## 2015-08-16 DIAGNOSIS — Z7984 Long term (current) use of oral hypoglycemic drugs: Secondary | ICD-10-CM | POA: Diagnosis not present

## 2015-08-16 DIAGNOSIS — Z950 Presence of cardiac pacemaker: Secondary | ICD-10-CM

## 2015-08-16 DIAGNOSIS — Z7901 Long term (current) use of anticoagulants: Secondary | ICD-10-CM | POA: Insufficient documentation

## 2015-08-16 DIAGNOSIS — E785 Hyperlipidemia, unspecified: Secondary | ICD-10-CM | POA: Insufficient documentation

## 2015-08-16 DIAGNOSIS — I480 Paroxysmal atrial fibrillation: Secondary | ICD-10-CM | POA: Diagnosis not present

## 2015-08-16 DIAGNOSIS — E119 Type 2 diabetes mellitus without complications: Secondary | ICD-10-CM | POA: Insufficient documentation

## 2015-08-16 DIAGNOSIS — M1990 Unspecified osteoarthritis, unspecified site: Secondary | ICD-10-CM | POA: Diagnosis not present

## 2015-08-16 DIAGNOSIS — I10 Essential (primary) hypertension: Secondary | ICD-10-CM | POA: Insufficient documentation

## 2015-08-16 DIAGNOSIS — Z8249 Family history of ischemic heart disease and other diseases of the circulatory system: Secondary | ICD-10-CM | POA: Diagnosis not present

## 2015-08-16 DIAGNOSIS — Z87891 Personal history of nicotine dependence: Secondary | ICD-10-CM | POA: Diagnosis not present

## 2015-08-16 DIAGNOSIS — M329 Systemic lupus erythematosus, unspecified: Secondary | ICD-10-CM | POA: Insufficient documentation

## 2015-08-16 DIAGNOSIS — Z959 Presence of cardiac and vascular implant and graft, unspecified: Secondary | ICD-10-CM

## 2015-08-16 DIAGNOSIS — I959 Hypotension, unspecified: Secondary | ICD-10-CM | POA: Insufficient documentation

## 2015-08-16 HISTORY — DX: Presence of cardiac pacemaker: Z95.0

## 2015-08-16 HISTORY — PX: EP IMPLANTABLE DEVICE: SHX172B

## 2015-08-16 HISTORY — DX: Frequency of micturition: R35.0

## 2015-08-16 LAB — SURGICAL PCR SCREEN
MRSA, PCR: NEGATIVE
STAPHYLOCOCCUS AUREUS: NEGATIVE

## 2015-08-16 LAB — GLUCOSE, CAPILLARY
Glucose-Capillary: 150 mg/dL — ABNORMAL HIGH (ref 65–99)
Glucose-Capillary: 229 mg/dL — ABNORMAL HIGH (ref 65–99)

## 2015-08-16 SURGERY — PACEMAKER IMPLANT

## 2015-08-16 MED ORDER — METOPROLOL TARTRATE 25 MG PO TABS
37.5000 mg | ORAL_TABLET | Freq: Two times a day (BID) | ORAL | Status: DC
  Administered 2015-08-16 – 2015-08-17 (×2): 37.5 mg via ORAL
  Filled 2015-08-16 (×2): qty 1

## 2015-08-16 MED ORDER — MUPIROCIN 2 % EX OINT
TOPICAL_OINTMENT | CUTANEOUS | Status: AC
  Administered 2015-08-16: 1 via TOPICAL
  Filled 2015-08-16: qty 22

## 2015-08-16 MED ORDER — ADULT MULTIVITAMIN W/MINERALS CH
1.0000 | ORAL_TABLET | Freq: Every day | ORAL | Status: DC
  Administered 2015-08-16 – 2015-08-17 (×2): 1 via ORAL
  Filled 2015-08-16 (×3): qty 1

## 2015-08-16 MED ORDER — METRONIDAZOLE 1 % EX CREA: 1.0000 "application " | TOPICAL_CREAM | Freq: Every day | CUTANEOUS | Status: DC

## 2015-08-16 MED ORDER — AMIODARONE HCL 200 MG PO TABS
400.0000 mg | ORAL_TABLET | Freq: Two times a day (BID) | ORAL | Status: DC
  Administered 2015-08-16 – 2015-08-17 (×2): 400 mg via ORAL
  Filled 2015-08-16 (×2): qty 2

## 2015-08-16 MED ORDER — MIDAZOLAM HCL 5 MG/5ML IJ SOLN
INTRAMUSCULAR | Status: DC | PRN
  Administered 2015-08-16 (×2): 1 mg via INTRAVENOUS

## 2015-08-16 MED ORDER — CEFAZOLIN SODIUM 1-5 GM-% IV SOLN
1.0000 g | Freq: Four times a day (QID) | INTRAVENOUS | Status: AC
  Administered 2015-08-16 – 2015-08-17 (×3): 1 g via INTRAVENOUS
  Filled 2015-08-16 (×4): qty 50

## 2015-08-16 MED ORDER — MUPIROCIN 2 % EX OINT
1.0000 "application " | TOPICAL_OINTMENT | Freq: Once | CUTANEOUS | Status: AC
  Administered 2015-08-16: 1 via TOPICAL
  Filled 2015-08-16: qty 22

## 2015-08-16 MED ORDER — FENTANYL CITRATE (PF) 100 MCG/2ML IJ SOLN
INTRAMUSCULAR | Status: AC
  Filled 2015-08-16: qty 2

## 2015-08-16 MED ORDER — SODIUM CHLORIDE 0.9 % IR SOLN
Status: AC
  Filled 2015-08-16: qty 2

## 2015-08-16 MED ORDER — MECLIZINE HCL 25 MG PO TABS: 25.0000 mg | ORAL_TABLET | Freq: Three times a day (TID) | ORAL | Status: DC | PRN

## 2015-08-16 MED ORDER — DOXYCYCLINE HYCLATE 100 MG PO TABS
100.0000 mg | ORAL_TABLET | Freq: Two times a day (BID) | ORAL | Status: DC
Start: 2015-08-16 — End: 2015-08-17
  Administered 2015-08-16 – 2015-08-17 (×2): 100 mg via ORAL
  Filled 2015-08-16 (×2): qty 1

## 2015-08-16 MED ORDER — ONDANSETRON HCL 4 MG/2ML IJ SOLN
4.0000 mg | Freq: Four times a day (QID) | INTRAMUSCULAR | Status: DC | PRN
Start: 2015-08-16 — End: 2015-08-17

## 2015-08-16 MED ORDER — ACETAMINOPHEN 325 MG PO TABS
325.0000 mg | ORAL_TABLET | ORAL | Status: DC | PRN
  Administered 2015-08-16: 650 mg via ORAL
  Filled 2015-08-16: qty 2

## 2015-08-16 MED ORDER — MIDAZOLAM HCL 5 MG/5ML IJ SOLN
INTRAMUSCULAR | Status: AC
  Filled 2015-08-16: qty 5

## 2015-08-16 MED ORDER — FENTANYL CITRATE (PF) 100 MCG/2ML IJ SOLN
INTRAMUSCULAR | Status: DC | PRN
  Administered 2015-08-16: 25 ug via INTRAVENOUS

## 2015-08-16 MED ORDER — LIDOCAINE HCL (PF) 1 % IJ SOLN
INTRAMUSCULAR | Status: DC | PRN
  Administered 2015-08-16: 31 mL

## 2015-08-16 MED ORDER — METFORMIN HCL 500 MG PO TABS
500.0000 mg | ORAL_TABLET | Freq: Two times a day (BID) | ORAL | Status: DC
  Administered 2015-08-16 – 2015-08-17 (×2): 500 mg via ORAL
  Filled 2015-08-16 (×2): qty 1

## 2015-08-16 MED ORDER — LEVOTHYROXINE SODIUM 112 MCG PO TABS
112.0000 ug | ORAL_TABLET | Freq: Every day | ORAL | Status: DC
  Administered 2015-08-17: 112 ug via ORAL
  Filled 2015-08-16: qty 1

## 2015-08-16 MED ORDER — AMIODARONE HCL 200 MG PO TABS: 400.0000 mg | ORAL_TABLET | Freq: Two times a day (BID) | ORAL | Status: DC

## 2015-08-16 MED ORDER — APIXABAN 5 MG PO TABS
5.0000 mg | ORAL_TABLET | Freq: Two times a day (BID) | ORAL | Status: DC
  Administered 2015-08-16 – 2015-08-17 (×2): 5 mg via ORAL
  Filled 2015-08-16 (×2): qty 1

## 2015-08-16 MED ORDER — LIDOCAINE HCL (PF) 1 % IJ SOLN
INTRAMUSCULAR | Status: AC
  Filled 2015-08-16: qty 30

## 2015-08-16 MED ORDER — SODIUM CHLORIDE 0.9 % IV SOLN: INTRAVENOUS | Status: AC

## 2015-08-16 MED ORDER — CHLORHEXIDINE GLUCONATE 4 % EX LIQD
60.0000 mL | Freq: Once | CUTANEOUS | Status: DC
  Filled 2015-08-16: qty 60

## 2015-08-16 MED ORDER — SODIUM CHLORIDE 0.9 % IV SOLN
INTRAVENOUS | Status: DC
  Administered 2015-08-16: 12:00:00 via INTRAVENOUS

## 2015-08-16 MED ORDER — CEFAZOLIN SODIUM-DEXTROSE 2-3 GM-% IV SOLR
2.0000 g | INTRAVENOUS | Status: AC
  Administered 2015-08-16: 2 g via INTRAVENOUS

## 2015-08-16 MED ORDER — CEFAZOLIN SODIUM-DEXTROSE 2-3 GM-% IV SOLR
INTRAVENOUS | Status: AC
  Filled 2015-08-16: qty 50

## 2015-08-16 MED ORDER — SODIUM CHLORIDE 0.9 % IR SOLN: 80.0000 mg | Status: DC

## 2015-08-16 MED ORDER — TAMSULOSIN HCL 0.4 MG PO CAPS
0.4000 mg | ORAL_CAPSULE | Freq: Two times a day (BID) | ORAL | Status: DC
  Administered 2015-08-16 – 2015-08-17 (×2): 0.4 mg via ORAL
  Filled 2015-08-16 (×2): qty 1

## 2015-08-16 SURGICAL SUPPLY — 7 items
CABLE SURGICAL S-101-97-12 (CABLE) ×2 IMPLANT
LEAD CAPSURE NOVUS 5076-52CM (Lead) ×2 IMPLANT
LEAD CAPSURE NOVUS 5076-58CM (Lead) ×2 IMPLANT
PAD DEFIB LIFELINK (PAD) ×2 IMPLANT
PPM ADVISA MRI DR A2DR01 (Pacemaker) ×2 IMPLANT
SHEATH CLASSIC 7F (SHEATH) ×4 IMPLANT
TRAY PACEMAKER INSERTION (PACKS) ×2 IMPLANT

## 2015-08-16 NOTE — Progress Notes (Signed)
Orthopedic Tech Progress Note Patient Details:  Ernest [name] [birth year] [id number]  Ortho Devices Type of Ortho Device: Arm sling Ortho Device/Splint Interventions: Application   [name] 08/16/2015, 4:35 PM

## 2015-08-16 NOTE — Interval H&P Note (Signed)
History and Physical Interval Note:  08/16/2015 11:48 AM  Ernest [name]  has presented today for surgery, with the diagnosis of tachibradi  The various methods of treatment have been discussed with the patient and family. After consideration of risks, benefits and other options for treatment, the patient has consented to  Procedure(s): Pacemaker Implant (N/A) as a surgical intervention .  The patient's history has been reviewed, patient examined, no change in status, stable for surgery.  I have reviewed the patient's chart and labs.  Questions were answered to the patient's satisfaction.     Virl Axe

## 2015-08-16 NOTE — H&P (View-Only) (Signed)
Electrophysiology Office Note   Date:  08/12/2015   ID:  HEMAN SALAH, DOB [birth year], MRN [id number]  PCP:  [name], MD  Cardiologist:  [id number] Primary Electrophysiologist:   Virl Axe, MD    Chief Complaint  Patient presents with  . other    F/u ED due to chest pain. Meds reviewed verbally with pt.     History of Present Illness: BURK RAGNO is a 80 y.o. male seen in electrophysiology followup  for  atrial fibrillation-paroxysmal with rapid ventricular response associated with sinus bradycardia. He has a history of coronary disease. He is managed with apixaban.  We have discussed dofetilide and amiodarone and pacing as various contributors to a strategy. At our last visit 12/15 the decision was made to continue the current strategy using when necessary blockers for more rapid heart rates     DATE TEST    9/16 echo     EF55-60  mild LAE              He was seen a couple of weeks ago. He continued to complain of tachypalpitations as well as bradycardia with symptoms of lightheadedness fatigue AND chest pain. We discussed options at that time including maintaining current course, antiarrhythmic therapy, AV junction ablation and pacing, pulmonary vein isolation. The concern was blood pressure and heart rate both limited up titration of rate controlling medications. In the interim he was seen in the emergency room at [facility].  Serial troponins were negative. It was felt that his chest pain might be related to his rapid atrial fibrillation.   He is here to review options  His chart describes catheterization 2015. Was associated with "medical therapy". Have not been able to find the report.  We have just been able to get the cath report. It demonstrated 70% mid marginal as well as 20-30% disease diffusely.  Past Medical History  Diagnosis Date  . Thyroid disease   . Hyperlipidemia   . BPH (benign prostatic hypertrophy)   . Diabetes mellitus without  complication ([place])   . Essential hypertension   . Aortic atherosclerosis ([place])   . PVD (peripheral vascular disease) ([place])   . Stroke ([place])   . PAF (paroxysmal atrial fibrillation) ([place])     a. CHA2DS2VASc = 7-->eliquis;  b. 03/2015 Echo: EF 55-60%, Gr1 DD, mild MR, mildly dil LA, PASP 58mmHg.  Marland Kitchen Coronary artery disease     a. Cath 12/2013->[facility]->Med rx.  Marland Kitchen GERD (gastroesophageal reflux disease)   . Pneumonia     as a child  . Arthritis   . Rosacea   . 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 Deviated nasal septum   . Hearing deficit     wears hearing aids   Past Surgical History  Procedure Laterality Date  . Colonoscopy    . Prostate biopsy    . Cardiac catheterization      [facility] 12/24/13: 30% LM , pLAD, ostial CX. 20% mLAD, mRCA. 70% OM2 (small vessel). Med RX.   Marland Kitchen Cardiac catheterization      [facility]  . Cardiac catheter[facility]      [facility]  . Cataract extraction w/ intraocular lens implant      right  . Multiple tooth extractions    . Lumbar laminectomy/decompression microdiscectomy N/A 01/04/2015    Procedure: LUMBAR LAMINECTOMY/DECOMPRESSION MICRODISCECTOMY 3 LEVELS;  Surgeon: Newman Pies, MD;  Location: [place] [facility] ORS;  Service: Neurosurgery;  Laterality: N/A;  L23 L34 L45 laminectomies  and foraminotomies     Current Outpatient Prescriptions  Medication Sig Dispense Refill  . apixaban (ELIQUIS) 5 MG TABS tablet Take 1 tablet (5 mg total) by mouth 2 (two) times daily. 14 tablet 1  . doxycycline (VIBRAMYCIN) 100 MG capsule Take 100 mg by mouth daily.    Marland Kitchen levothyroxine (SYNTHROID, LEVOTHROID) 112 MCG tablet Take 112 mcg by mouth daily before breakfast.    . lovastatin (MEVACOR) 40 MG tablet Take 40 mg by mouth at bedtime.     . meclizine (ANTIVERT) 25 MG tablet Take 25 mg by mouth 3 (three) times daily as needed. Use up to 10 days for dizziness  0  . metFORMIN (GLUCOPHAGE) 500 MG tablet Take 500 mg by mouth 2 (two) times daily with a meal.     . metoprolol tartrate  (LOPRESSOR) 25 MG tablet Take 1 & 1/2 tablets (37.5 mg) by mouth twice daily 135 tablet 3  . metronidazole (NORITATE) 1 % cream Apply 1 application topically daily.    . Multiple Vitamin (MULTIVITAMIN) tablet Take 1 tablet by mouth daily.    . tamsulosin (FLOMAX) 0.4 MG CAPS capsule Take 0.4 mg by mouth 2 (two) times daily.     No current facility-administered medications for this visit.    Allergies:   Review of patient's allergies indicates no known allergies.   Social History:  The patient  reports that he has quit smoking. His smoking use included Cigarettes. He has never used smokeless tobacco. He reports that he does not drink alcohol or use illicit drugs.   Family History:  The patient's family history includes Cancer in his brother; Cancer - Lung in his brother, brother, and sister; Diabetes in his sister; Heart attack in his father and mother.    ROS:  Please see the history of present illness.   Otherwise, review of systems is negative .    PHYSICAL EXAM: VS:  BP 100/64 mmHg  Pulse 56  Ht 5\' 7"  (1.702 m)  Wt 169 lb (76.658 kg)  BMI 26.46 kg/m2 , BMI Body mass index is 26.46 kg/(m^2). GEN: Well nourished, well developed, in no acute distress HEENT: normal Neck: JVD flat,   Cardiac: RRR;  no murmur  rubs, + S4  Respiratory:  clear to auscultation bilaterally, normal work of breathing Back without kyphosis or CVAT GI: soft, nontender, nondistended, + BS MS: no deformity or atrophy Skin: warm and dry,  Extremities No Clubbing cyanosis  Edema Neuro:  Strength and sensation are intact Psych: euthymic mood, full affect  EKG:  EKG is ordered today. The ekg ordered today shows sinus rhythm at 58 Interval 17/09/39 Axis LX    Recent Labs: 06/20/2015: ALT 23 08/11/2015: BUN 18; Creatinine, Ser 0.91; Hemoglobin 15.4; Platelets 168; Potassium 4.6; Sodium 136; TSH 1.944    Lipid Panel     Component Value Date/Time   CHOL 128 08/11/2011 0448   TRIG 160 08/11/2011 0448    HDL 33* 08/11/2011 0448   VLDL 32 08/11/2011 0448   LDLCALC 63 08/11/2011 0448     Wt Readings from Last 3 Encounters:  08/12/15 169 lb (76.658 kg)  08/11/15 170 lb (77.111 kg)  08/03/15 168 lb 12.8 oz (76.567 kg)      Other studies Reviewed:  ER records and labs   ASSESSMENT AND PLAN:  Atrial fibrillation-paroxysmal  Coronary artery disease  Prior stroke  Sinus bradycardia  Hypotension   The saga of yesterday was reviewed with the medical record is with the patient.  He has a the options quite clear to him and to his wife. They would like to proceed with pacemaker implantation. That offers 3 further options. #1 AV nodal blockade; I suspect this will not be useful given his low blood pressure. #2-antiarrhythmic therapy with the hopes that maintaining sinus rhythm will protect him from ventricular pacing and tachycardia associated chest pain and #3-AV junction ablation. We will pursue #2. Once we have the catheterization information will decide between a 1C agent and a class III agent.  Based on his catheterization report, we will plan to avoid the 1C agent and use a class III. I will discuss with the family dofetilide versus amiodarone. His QTC is about 370 ms.  With his history of TIA we will do his procedure with his anticoagulation. The benefits and risks were reviewed including but not limited to death,  perforation, infection, lead dislodgement and device malfunction.  The patient understands agrees and is willing to proceed.   Thank you Labs/ tests ordered today include:      Disposition:   We will call him next week   Signed, Virl Axe, MD  08/12/2015 11:04 AM     [facility] [address] [place] [address] [phone] (office) [phone] (fax) we'll

## 2015-08-16 NOTE — Discharge Instructions (Signed)
° ° °  Supplemental Discharge Instructions for  Pacemaker/Defibrillator Patients  Activity No heavy lifting or vigorous activity with your left/right arm for 6 to 8 weeks.  Do not raise your left/right arm above your head for one week.  Gradually raise your affected arm as drawn below.           __       08/20/15                 08/21/15                    08/22/15                 08/23/15  NO DRIVING for   1 week  ; you may begin driving on   [id number]  .  WOUND CARE - Keep the wound area clean and dry.   - No bandage is needed on the site.  DO  NOT apply any creams, oils, or ointments to the wound area. - If you notice any drainage or discharge from the wound, any swelling or bruising at the site, or you develop a fever > 101? F after you are discharged home, call the office at once.  Special Instructions - You are still able to use cellular telephones; use the ear opposite the side where you have your pacemaker/defibrillator.  Avoid carrying your cellular phone near your device. - When traveling through airports, show security personnel your identification card to avoid being screened in the metal detectors.  Ask the security personnel to use the hand wand. - Avoid arc welding equipment, TENS units (transcutaneous nerve stimulators).  Call the office for questions about other devices. - Avoid electrical appliances that are in poor condition or are not properly grounded. - Microwave ovens are safe to be near or to operate.

## 2015-08-16 NOTE — Discharge Summary (Signed)
ELECTROPHYSIOLOGY PROCEDURE DISCHARGE SUMMARY    Patient ID: Ernest [name],  MRN: [id number], DOB/AGE: 12-May-1936 80 y.o.  Admit date: 08/16/2015 Discharge date: 08/17/2015  Primary Care Physician: [name], MD Primary Cardiologist: Nehemiah Massed Electrophysiologist: Caryl Comes  Primary Discharge Diagnosis:  Symptomatic sinus bradycardia status post pacemaker implantation this admission  Secondary Discharge Diagnosis:  1.  Paroxysmal atrial fibrillation with RVR 2.  CAD 3.  Diabetes 4.  Hypertension 5.  Hyperlipidemia 6.  GERD 7.  Prior CVA  No Known Allergies   Procedures This Admission:  1.  Implantation of a MDT dual chamber PPM on 08/16/15 by Dr Caryl Comes.  The patient received a MDT model number Advisa PPM with model number 5076 right atrial lead and 5076 right ventricular lead. There were no immediate post procedure complications. 2.  CXR on 08/17/15 demonstrated no pneumothorax status post device implantation.   Brief HPI: Ernest [name] is a 80 y.o. male with a past medical history significant for symptomatic bradycardia and AF with RVR.  Medical therapy is limited by bradycardia.  Risks, benefits, and alternatives to PPM implantation were reviewed with the patient who wished to proceed.   Hospital Course:  The patient was admitted and underwent implantation of a MDT MRI compatible dual chamber pacemaker with details as outlined above.  He  was monitored on telemetry overnight which demonstrated a well controlled HR.  Left chest was without hematoma or ecchymosis.  The device was interrogated and found to be functioning normally.  CXR was obtained and demonstrated no pneumothorax status post device implantation.  Wound care, arm mobility, and restrictions were reviewed with the patient.  The patient was examined and considered stable for discharge to home.   Amiodarone was started this admission for control of AF. Recent LFT's, TSH normal. Will need outpatient PFT's  scheduled.  Physical Exam: Filed Vitals:   08/16/15 1802 08/16/15 2000 08/16/15 2153 08/17/15 0503  BP: 144/75 121/67 127/56 114/64  Pulse: 64 70 65 110  Temp:   98 F (36.7 C) 98.2 F (36.8 C)  TempSrc:   Oral Oral  Resp:   14   Height:      Weight:      SpO2:   98% 97%    GEN- The patient is well appearing, alert and oriented x 3 today.   HEENT: normocephalic, atraumatic; sclera clear, conjunctiva pink; hearing intact; oropharynx clear; neck supple, no JVP Lymph- no cervical lymphadenopathy Lungs- Clear to ausculation bilaterally, normal work of breathing.  No wheezes, rales, rhonchi Heart- Regular rate and rhythm, no murmurs, rubs or gallops, PMI not laterally displaced GI- soft, non-tender, non-distended, bowel sounds present, no hepatosplenomegaly Extremities- no clubbing, cyanosis, or edema; DP/PT/radial pulses 2+ bilaterally MS- no significant deformity or atrophy Skin- warm and dry, no rash or lesion, left chest without hematoma/ecchymosis Psych- euthymic mood, full affect Neuro- strength and sensation are intact   Labs:   Lab Results  Component Value Date   WBC 11.7* 08/11/2015   HGB 15.4 08/11/2015   HCT 45.2 08/11/2015   MCV 88.2 08/11/2015   PLT 168 08/11/2015     Recent Labs Lab 08/11/15 1239  NA 136  K 4.6  CL 107  CO2 23  BUN 18  CREATININE 0.91  CALCIUM 9.9  GLUCOSE 249*    Discharge Medications:    Medication List    TAKE these medications        amiodarone 400 MG tablet  Commonly known as:  PACERONE  Take 1 tablet (400 mg total) by mouth 2 (two) times daily.     apixaban 5 MG Tabs tablet  Commonly known as:  ELIQUIS  Take 1 tablet (5 mg total) by mouth 2 (two) times daily.     doxycycline 100 MG capsule  Commonly known as:  VIBRAMYCIN  Take 100 mg by mouth 2 (two) times daily.     levothyroxine 112 MCG tablet  Commonly known as:  SYNTHROID, LEVOTHROID  Take 112 mcg by mouth daily before breakfast.     lovastatin 40 MG  tablet  Commonly known as:  MEVACOR  Take 40 mg by mouth at bedtime.     meclizine 25 MG tablet  Commonly known as:  ANTIVERT  Take 25 mg by mouth 3 (three) times daily as needed for dizziness or nausea.     metFORMIN 500 MG tablet  Commonly known as:  GLUCOPHAGE  Take 500 mg by mouth 2 (two) times daily with a meal.     metoprolol tartrate 25 MG tablet  Commonly known as:  LOPRESSOR  Take 1 & 1/2 tablets (37.5 mg) by mouth twice daily     metronidazole 1 % cream  Commonly known as:  NORITATE  Apply 1 application topically daily.     multivitamin tablet  Take 1 tablet by mouth daily.     tamsulosin 0.4 MG Caps capsule  Commonly known as:  FLOMAX  Take 0.4 mg by mouth 2 (two) times daily.        Disposition:  Discharge Instructions    Diet - low sodium heart healthy    Complete by:  As directed      Increase activity slowly    Complete by:  As directed           Follow-up Information    Follow up with [facility] On 08/26/2015.   Specialty:  Cardiology   Why:  at 12Noon for wound check    Contact information:   [address], [place] [phone]      Follow up with Virl Axe, MD On 09/21/2015.   Specialty:  Cardiology   Why:  at 9:30AM    Contact information:   [place] [address] [phone]       Duration of Discharge Encounter: Greater than 30 minutes including physician time.  Signed, Chanetta Marshall, NP 08/17/2015 9:17 AM  EP Attending  Patient seen and examined. Agree with the findings as noted above. His PPM is working normally. Will followup as per usual. Continue amiodarone.   Mikle Bosworth.D.

## 2015-08-17 ENCOUNTER — Encounter (HOSPITAL_COMMUNITY): Payer: Self-pay | Admitting: Internal Medicine

## 2015-08-17 ENCOUNTER — Ambulatory Visit (HOSPITAL_COMMUNITY): Payer: PPO

## 2015-08-17 DIAGNOSIS — I495 Sick sinus syndrome: Secondary | ICD-10-CM | POA: Diagnosis not present

## 2015-08-17 DIAGNOSIS — Z950 Presence of cardiac pacemaker: Secondary | ICD-10-CM | POA: Diagnosis not present

## 2015-08-17 DIAGNOSIS — I2510 Atherosclerotic heart disease of native coronary artery without angina pectoris: Secondary | ICD-10-CM | POA: Diagnosis not present

## 2015-08-17 DIAGNOSIS — E119 Type 2 diabetes mellitus without complications: Secondary | ICD-10-CM | POA: Diagnosis not present

## 2015-08-17 DIAGNOSIS — I480 Paroxysmal atrial fibrillation: Secondary | ICD-10-CM | POA: Diagnosis not present

## 2015-08-17 MED ORDER — AMIODARONE HCL 400 MG PO TABS: 400.0000 mg | ORAL_TABLET | Freq: Two times a day (BID) | ORAL | Status: DC

## 2015-08-17 MED FILL — Sodium Chloride Irrigation Soln 0.9%: Qty: 500 | Status: AC

## 2015-08-17 MED FILL — Gentamicin Sulfate Inj 40 MG/ML: INTRAMUSCULAR | Qty: 2 | Status: AC

## 2015-08-17 NOTE — Progress Notes (Signed)
Discussed with patient and spouse discharge instructions, both verbalized agreement and understanding.  Patient's IV was discontinued with no complications.  Patient to go down in wheelchair with all belongings to go home in private vehicle. 

## 2015-08-18 ENCOUNTER — Other Ambulatory Visit: Payer: Self-pay

## 2015-08-18 ENCOUNTER — Telehealth: Payer: Self-pay | Admitting: *Deleted

## 2015-08-18 DIAGNOSIS — I480 Paroxysmal atrial fibrillation: Secondary | ICD-10-CM

## 2015-08-18 MED ORDER — METOPROLOL TARTRATE 25 MG PO TABS: ORAL_TABLET | ORAL | Status: DC

## 2015-08-18 NOTE — Telephone Encounter (Signed)
Left message on pt wife VM to contact the office.

## 2015-08-18 NOTE — Telephone Encounter (Signed)
S/w pt wife who reports amiodarone instructions when pt d/c'd from hospital indicate 1 tablet (400mg ) BID however, when she picked up the prescription, instructions on the bottle stated 2 tablets BID. Advised pt wife to continue dosage of 1 tablet BID as is stated in pt chart. I do not see anywhere in the chart that pt was advised to take 800mg  BID. She states she has been giving him 400mg  BID Wife verbalized understanding and is appreciative of the call to clarify.

## 2015-08-18 NOTE — Telephone Encounter (Signed)
Pt wife calling stating pt had pacer put in this week And had a medication question. On amiodarone, not sure how much to take.  Instructions say one thing and the bottle says another.   Metoprolol refill also needed please send to [facility]   Please call

## 2015-08-26 ENCOUNTER — Encounter: Payer: Self-pay | Admitting: Internal Medicine

## 2015-08-26 ENCOUNTER — Ambulatory Visit (INDEPENDENT_AMBULATORY_CARE_PROVIDER_SITE_OTHER): Payer: PPO | Admitting: *Deleted

## 2015-08-26 ENCOUNTER — Ambulatory Visit: Payer: Commercial Managed Care - HMO | Admitting: Internal Medicine

## 2015-08-26 DIAGNOSIS — R001 Bradycardia, unspecified: Secondary | ICD-10-CM | POA: Diagnosis not present

## 2015-08-26 DIAGNOSIS — Z959 Presence of cardiac and vascular implant and graft, unspecified: Secondary | ICD-10-CM

## 2015-08-26 DIAGNOSIS — I480 Paroxysmal atrial fibrillation: Secondary | ICD-10-CM | POA: Diagnosis not present

## 2015-08-26 LAB — CUP PACEART INCLINIC DEVICE CHECK
Brady Statistic AP VS Percent: 70.97 %
Brady Statistic AS VP Percent: 0.03 %
Brady Statistic AS VS Percent: 28.86 %
Brady Statistic RA Percent Paced: 71.1 %
Date Time Interrogation Session: 20170216181012
Implantable Lead Implant Date: 20170206
Implantable Lead Location: 753860
Implantable Lead Model: 5076
Implantable Lead Model: 5076
Lead Channel Impedance Value: 475 Ohm
Lead Channel Impedance Value: 570 Ohm
Lead Channel Sensing Intrinsic Amplitude: 2.75 mV
Lead Channel Sensing Intrinsic Amplitude: 9.375 mV
Lead Channel Setting Pacing Amplitude: 3.5 V
Lead Channel Setting Pacing Amplitude: 3.5 V
Lead Channel Setting Pacing Pulse Width: 0.4 ms
Lead Channel Setting Sensing Sensitivity: 2.8 mV
MDC IDC LEAD IMPLANT DT: 20170206
MDC IDC LEAD LOCATION: 753859
MDC IDC MSMT BATTERY VOLTAGE: 3.14 V
MDC IDC MSMT LEADCHNL RA IMPEDANCE VALUE: 437 Ohm
MDC IDC MSMT LEADCHNL RA IMPEDANCE VALUE: 494 Ohm
MDC IDC MSMT LEADCHNL RA PACING THRESHOLD AMPLITUDE: 0.75 V
MDC IDC MSMT LEADCHNL RA PACING THRESHOLD PULSEWIDTH: 0.4 ms
MDC IDC MSMT LEADCHNL RV PACING THRESHOLD AMPLITUDE: 0.75 V
MDC IDC MSMT LEADCHNL RV PACING THRESHOLD PULSEWIDTH: 0.4 ms
MDC IDC STAT BRADY AP VP PERCENT: 0.13 %
MDC IDC STAT BRADY RV PERCENT PACED: 0.16 %

## 2015-08-26 NOTE — Progress Notes (Signed)
Wound check appointment. Dermabond removed. Wound without redness, some swelling and tenderness noted over device, soft to palpation. AS saw patient, recommended continuing to monitor. Incision edges approximated, wound well healed. Patient and wife aware to call with signs/symptoms of infection. Normal device function. Thresholds, sensing, and impedances consistent with implant measurements. Device programmed at 3.5V/auto capture programmed on for extra safety margin until 3 month visit. Histogram distribution appropriate for patient and level of activity. 33 AT/AF episodes, EGMs suggest AF, +Eliquis. No high ventricular rates noted. Patient educated about wound care, arm mobility, lifting restrictions. ROV with SK on 09/21/15.

## 2015-09-21 ENCOUNTER — Other Ambulatory Visit: Payer: Self-pay

## 2015-09-21 ENCOUNTER — Telehealth: Payer: Self-pay | Admitting: *Deleted

## 2015-09-21 ENCOUNTER — Ambulatory Visit (INDEPENDENT_AMBULATORY_CARE_PROVIDER_SITE_OTHER): Payer: PPO | Admitting: Internal Medicine

## 2015-09-21 ENCOUNTER — Telehealth: Payer: Self-pay | Admitting: Internal Medicine

## 2015-09-21 ENCOUNTER — Encounter: Payer: Self-pay | Admitting: Internal Medicine

## 2015-09-21 VITALS — BP 120/66 | HR 65 | Ht 67.0 in | Wt 168.8 lb

## 2015-09-21 DIAGNOSIS — I2510 Atherosclerotic heart disease of native coronary artery without angina pectoris: Secondary | ICD-10-CM | POA: Diagnosis not present

## 2015-09-21 DIAGNOSIS — Z959 Presence of cardiac and vascular implant and graft, unspecified: Secondary | ICD-10-CM

## 2015-09-21 DIAGNOSIS — Z9229 Personal history of other drug therapy: Secondary | ICD-10-CM

## 2015-09-21 DIAGNOSIS — Z79899 Other long term (current) drug therapy: Secondary | ICD-10-CM

## 2015-09-21 DIAGNOSIS — I4891 Unspecified atrial fibrillation: Secondary | ICD-10-CM | POA: Diagnosis not present

## 2015-09-21 DIAGNOSIS — Z09 Encounter for follow-up examination after completed treatment for conditions other than malignant neoplasm: Secondary | ICD-10-CM

## 2015-09-21 DIAGNOSIS — I159 Secondary hypertension, unspecified: Secondary | ICD-10-CM | POA: Diagnosis not present

## 2015-09-21 MED ORDER — AMIODARONE HCL 200 MG PO TABS: 200.0000 mg | ORAL_TABLET | Freq: Every day | ORAL | Status: DC

## 2015-09-21 MED ORDER — AMIODARONE HCL 200 MG PO TABS: 200.0000 mg | ORAL_TABLET | Freq: Two times a day (BID) | ORAL | Status: DC

## 2015-09-21 NOTE — Patient Instructions (Addendum)
Medication Instructions:  Your physician has recommended you make the following change in your medication:  DECREASE amiodarone to 200mg  twice daily   Labwork: LFT and TSH today  Testing/Procedures: Referral to pulmonary for PFTs DLCO -   Follow-Up: Your physician recommends that you schedule a follow-up appointment and wound check with Dr. Caryl Comes in 6 weeks (after May 6)  Any Other Special Instructions Will Be Listed Below (If Applicable).     If you need a refill on your cardiac medications before your next appointment, please call your pharmacy.

## 2015-09-21 NOTE — Telephone Encounter (Signed)
PFT only per [place]

## 2015-09-21 NOTE — Telephone Encounter (Signed)
S/w Misty in Pulmonary who states pt can have PFT March 16, 2pm. He does not need to see Dr. Stevenson Clinch. S/w pt who is agreeable w/plan.  May appt w/Pulmonary cancelled.

## 2015-09-21 NOTE — Telephone Encounter (Signed)
Pt wife calling stating they were just seen in office  On their paperwork we change the dose on amiodarone  He  was taking 1 tablet ones a day  But she was sure he told them to take half a tablet once a day Please advise.

## 2015-09-21 NOTE — Telephone Encounter (Signed)
S/w wife who report pt amiodarone prescription is 200mg  NOT the 400mg  that was reported at today's OV. Reviewed MD instructions. Pt to take 200mg  amiodarone once daily. Pt verbalized understanding with no further questions.

## 2015-09-21 NOTE — Progress Notes (Signed)
Electrophysiology Office Note   Date:  09/21/2015   ID:  Ernest [name], DOB 1935/09/09, MRN [id number]  PCP:  Ernest Fennel, MD  Cardiologist:  [id number] Primary Electrophysiologist:   Virl Axe, MD    Chief Complaint  Patient presents with  . other    6 week follow up as well as pacemaker check. Meds reviewed by the patient verbally. "doing well."      History of Present Illness: Ernest [name] is a 80 y.o. male seen in electrophysiology followup  for  atrial fibrillation-paroxysmal with rapid ventricular response associated with sinus bradycardia. He has a history of coronary disease. He is managed with apixaban.  We have discussed dofetilide and amiodarone and pacing as various contributors to a strategy.    DATE TEST    9/16 echo     EF55-60  mild LAE               At the last visit, it was elected to proceed with pacing with use of antiarrhythmic therapy to try to control rate. This was as an alternative to AV junction ablation    Apart from the discomfort associated with the procedure, he is felt much better. There is improved energy and no symptomatic palpitations.  Patient denies symptoms of GI intolerance, sun sensitivity, neurological symptoms attributable to amiodarone.  Surveillance laboratorieswill be checked   His chart describes catheterization 2015.   It demonstrated Multiple70% mid marginal as well as 20-30% disease diffusely.  Past Medical History  Diagnosis Date  . Thyroid disease   . Hyperlipidemia   . BPH (benign prostatic hypertrophy)   . Essential hypertension   . Aortic atherosclerosis ([place])   . PVD (peripheral vascular disease) ([place])   . Stroke ([place])   . PAF (paroxysmal atrial fibrillation) ([place])     a. CHA2DS2VASc = 7-->eliquis;  b. 03/2015 Echo: EF 55-60%, Gr1 DD, mild MR, mildly dil LA, PASP 1mmHg.  Marland Kitchen Coronary artery disease     a. Cath 12/2013->[facility]->Med rx.  Marland Kitchen GERD (gastroesophageal reflux disease)   . Pneumonia     as a  child  . Rosacea   . 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 Deviated nasal septum   . Hearing deficit     wears hearing aids  . Presence of permanent cardiac pacemaker 08/16/2015  . Diabetes mellitus without complication ([place])     type 2  . Urine frequency   . Arthritis    Past Surgical History  Procedure Laterality Date  . Colonoscopy    . Prostate biopsy    . Cardiac catheterization      [facility] 12/24/13: 30% LM , pLAD, ostial CX. 20% mLAD, mRCA. 70% OM2 (small vessel). Med RX.   Marland Kitchen Cardiac catheterization      [facility]  . Cardiac catheterization      [facility]  . Cataract extraction w/ intraocular lens implant      right  . Multiple tooth extractions    . Lumbar laminectomy/decompression microdiscectomy N/A 01/04/2015    Procedure: LUMBAR LAMINECTOMY/DECOMPRESSION MICRODISCECTOMY 3 LEVELS;  Surgeon: Newman Pies, MD;  Location: [facility];  Service: Neurosurgery;  Laterality: N/A;  L23 L34 L45 laminectomies and foraminotomies  . Ep implantable device N/A 08/16/2015    Procedure: Pacemaker Implant;  Surgeon: Deboraha Sprang, MD;  Location: [facility];  Service: Cardiovascular;  Laterality: N/A;     Current Outpatient Prescriptions  Medication Sig Dispense Refill  . amiodarone (  PACERONE) 400 MG tablet Take 1 tablet (400 mg total) by mouth 2 (two) times daily. 60 tablet 3  . apixaban (ELIQUIS) 5 MG TABS tablet Take 1 tablet (5 mg total) by mouth 2 (two) times daily. 14 tablet 1  . doxycycline (VIBRAMYCIN) 100 MG capsule Take 100 mg by mouth 2 (two) times daily.     Marland Kitchen levothyroxine (SYNTHROID, LEVOTHROID) 112 MCG tablet Take 112 mcg by mouth daily before breakfast.    . lovastatin (MEVACOR) 40 MG tablet Take 40 mg by mouth at bedtime.     . meclizine (ANTIVERT) 25 MG tablet Take 25 mg by mouth 3 (three) times daily as needed for dizziness or nausea.   0  . metFORMIN (GLUCOPHAGE) 500 MG tablet Take 500 mg by mouth 2 (two) times daily with a meal.     . metoprolol tartrate  (LOPRESSOR) 25 MG tablet Take 1 & 1/2 tablets (37.5 mg) by mouth twice daily 270 tablet 3  . metronidazole (NORITATE) 1 % cream Apply 1 application topically daily.    . Multiple Vitamin (MULTIVITAMIN) tablet Take 1 tablet by mouth daily.    . tamsulosin (FLOMAX) 0.4 MG CAPS capsule Take 0.4 mg by mouth 2 (two) times daily.     No current facility-administered medications for this visit.    Allergies:   Review of patient's allergies indicates no known allergies.   Social History:  The patient  reports that he has quit smoking. His smoking use included Cigarettes. He has never used smokeless tobacco. He reports that he does not drink alcohol or use illicit drugs.   Family History:  The patient's family history includes Cancer in his brother; Cancer - Lung in his brother, brother, and sister; Diabetes in his sister; Heart attack in his father and mother.    ROS:  Please see the history of present illness.   Otherwise, review of systems is negative .    PHYSICAL EXAM: VS:  BP 120/66 mmHg  Pulse 65  Ht 5\' 7"  (1.702 m)  Wt 168 lb 12 oz (76.544 kg)  BMI 26.42 kg/m2 , BMI Body mass index is 26.42 kg/(m^2). GEN: Well nourished, well developed, in no acute distress HEENT: normal Neck: JVD flat,   Cardiac: RRR;  no murmur  rubs, + S4  Respiratory:  clear to auscultation bilaterally, normal work of breathing Back without kyphosis or CVAT GI: soft, nontender, nondistended, + BS MS: no deformity or atrophy Skin: warm and dry,  Extremities No Clubbing cyanosis  Edema Neuro:  Strength and sensation are intact Psych: euthymic mood, full affect  EKG:  EKG is ordered today. The ekg ordered today shows Atrial pacing 65 21/09/42  Recent Labs: 06/20/2015: ALT 23 08/11/2015: BUN 18; Creatinine, Ser 0.91; Hemoglobin 15.4; Platelets 168; Potassium 4.6; Sodium 136; TSH 1.944    Lipid Panel     Component Value Date/Time   CHOL 128 08/11/2011 0448   TRIG 160 08/11/2011 0448   HDL 33* 08/11/2011  0448   VLDL 32 08/11/2011 0448   LDLCALC 63 08/11/2011 0448     Wt Readings from Last 3 Encounters:  09/21/15 168 lb 12 oz (76.544 kg)  08/16/15 170 lb (77.111 kg)  08/12/15 169 lb (76.658 kg)      Other studies Reviewed:  ER records and labs   ASSESSMENT AND PLAN:  Atrial fibrillation-paroxysmal  Coronary artery disease  Prior stroke  Sinus bradycardia  Hypotension   There is no intercurrent atrial fibrillation. We'll continue him on amiodarone. We  will check surveillance laboratories today and obtain a DLCO for baseline.   Apart from the trauma of the procedure with discomfort, he has done very well.   Disposition:   We will call him next week   Signed, Virl Axe, MD  09/21/2015 9:33 AM     [address] [place]  [address] [phone] (office) [phone] (fax) we'll

## 2015-09-22 LAB — CUP PACEART INCLINIC DEVICE CHECK
Brady Statistic AP VP Percent: 0.04 %
Brady Statistic AP VS Percent: 98.21 %
Brady Statistic AS VP Percent: 0.01 %
Brady Statistic RA Percent Paced: 98.25 %
Date Time Interrogation Session: 20170314133757
Implantable Lead Implant Date: 20170206
Implantable Lead Model: 5076
Lead Channel Impedance Value: 456 Ohm
Lead Channel Pacing Threshold Amplitude: 0.625 V
Lead Channel Pacing Threshold Pulse Width: 0.4 ms
Lead Channel Sensing Intrinsic Amplitude: 8.625 mV
Lead Channel Setting Pacing Amplitude: 3.5 V
Lead Channel Setting Pacing Amplitude: 3.5 V
Lead Channel Setting Sensing Sensitivity: 2.8 mV
MDC IDC LEAD IMPLANT DT: 20170206
MDC IDC LEAD LOCATION: 753859
MDC IDC LEAD LOCATION: 753860
MDC IDC MSMT BATTERY REMAINING LONGEVITY: 156 mo
MDC IDC MSMT BATTERY VOLTAGE: 3.11 V
MDC IDC MSMT LEADCHNL RA IMPEDANCE VALUE: 399 Ohm
MDC IDC MSMT LEADCHNL RA IMPEDANCE VALUE: 513 Ohm
MDC IDC MSMT LEADCHNL RA PACING THRESHOLD PULSEWIDTH: 0.4 ms
MDC IDC MSMT LEADCHNL RA SENSING INTR AMPL: 2 mV
MDC IDC MSMT LEADCHNL RV IMPEDANCE VALUE: 532 Ohm
MDC IDC MSMT LEADCHNL RV PACING THRESHOLD AMPLITUDE: 0.75 V
MDC IDC SET LEADCHNL RV PACING PULSEWIDTH: 0.4 ms
MDC IDC STAT BRADY AS VS PERCENT: 1.74 %
MDC IDC STAT BRADY RV PERCENT PACED: 0.05 %

## 2015-09-22 LAB — HEPATIC FUNCTION PANEL
ALK PHOS: 89 IU/L (ref 39–117)
ALT: 21 IU/L (ref 0–44)
AST: 19 IU/L (ref 0–40)
Albumin: 4.1 g/dL (ref 3.5–4.8)
BILIRUBIN TOTAL: 0.5 mg/dL (ref 0.0–1.2)
BILIRUBIN, DIRECT: 0.14 mg/dL (ref 0.00–0.40)
Total Protein: 6.1 g/dL (ref 6.0–8.5)

## 2015-09-22 LAB — TSH: TSH: 3.55 u[IU]/mL (ref 0.450–4.500)

## 2015-09-23 ENCOUNTER — Ambulatory Visit (INDEPENDENT_AMBULATORY_CARE_PROVIDER_SITE_OTHER): Payer: PPO | Admitting: *Deleted

## 2015-09-23 DIAGNOSIS — Z79899 Other long term (current) drug therapy: Secondary | ICD-10-CM | POA: Diagnosis not present

## 2015-09-23 LAB — PULMONARY FUNCTION TEST
DL/VA % PRED: 61 %
DL/VA: 2.71 ml/min/mmHg/L
DLCO UNC % PRED: 47 %
DLCO unc: 13.39 ml/min/mmHg
FEF 25-75 POST: 2.67 L/s
FEF 25-75 PRE: 2.12 L/s
FEF2575-%Change-Post: 25 %
FEF2575-%PRED-PRE: 116 %
FEF2575-%Pred-Post: 146 %
FEV1-%Change-Post: 7 %
FEV1-%Pred-Post: 90 %
FEV1-%Pred-Pre: 84 %
FEV1-Post: 2.34 L
FEV1-Pre: 2.18 L
FEV1FVC-%Change-Post: -6 %
FEV1FVC-%Pred-Pre: 109 %
FEV6-%CHANGE-POST: 8 %
FEV6-%PRED-POST: 88 %
FEV6-%PRED-PRE: 81 %
FEV6-POST: 3.01 L
FEV6-Pre: 2.76 L
FEV6FVC-%PRED-POST: 107 %
FEV6FVC-%Pred-Pre: 107 %
FVC-%Change-Post: 14 %
FVC-%PRED-POST: 86 %
FVC-%PRED-PRE: 75 %
FVC-POST: 3.16 L
FVC-Pre: 2.76 L
POST FEV6/FVC RATIO: 100 %
PRE FEV1/FVC RATIO: 79 %
Post FEV1/FVC ratio: 74 %
Pre FEV6/FVC Ratio: 100 %

## 2015-09-23 NOTE — Progress Notes (Signed)
PFT performed today with nitrogen washout. 

## 2015-09-27 ENCOUNTER — Telehealth: Payer: Self-pay | Admitting: Internal Medicine

## 2015-09-27 NOTE — Telephone Encounter (Signed)
Patient is in referral [place] for lbpu to schedule a new patient appt.  Just wanted to clarify this is needed.  Did he need a consult or just the pft that was already done?

## 2015-09-28 NOTE — Telephone Encounter (Signed)
Pt does not need referral to pulmonary Per Dr. Caryl Comes at March 13 OV, PFT [facility] ordered. No need for consult. Tests have been performed and results given to Dr. Caryl Comes

## 2015-10-05 ENCOUNTER — Encounter: Payer: Self-pay | Admitting: Internal Medicine

## 2015-11-08 ENCOUNTER — Ambulatory Visit: Payer: PPO | Admitting: Internal Medicine

## 2015-12-21 ENCOUNTER — Encounter: Payer: Self-pay | Admitting: Internal Medicine

## 2015-12-21 ENCOUNTER — Ambulatory Visit (INDEPENDENT_AMBULATORY_CARE_PROVIDER_SITE_OTHER): Payer: PPO | Admitting: Internal Medicine

## 2015-12-21 VITALS — BP 118/64 | HR 75 | Ht 67.0 in | Wt 168.2 lb

## 2015-12-21 DIAGNOSIS — I480 Paroxysmal atrial fibrillation: Secondary | ICD-10-CM

## 2015-12-21 DIAGNOSIS — Z950 Presence of cardiac pacemaker: Secondary | ICD-10-CM

## 2015-12-21 DIAGNOSIS — E7800 Pure hypercholesterolemia, unspecified: Secondary | ICD-10-CM | POA: Diagnosis not present

## 2015-12-21 DIAGNOSIS — E119 Type 2 diabetes mellitus without complications: Secondary | ICD-10-CM | POA: Diagnosis not present

## 2015-12-21 DIAGNOSIS — R001 Bradycardia, unspecified: Secondary | ICD-10-CM | POA: Diagnosis not present

## 2015-12-21 DIAGNOSIS — Z79899 Other long term (current) drug therapy: Secondary | ICD-10-CM | POA: Diagnosis not present

## 2015-12-21 DIAGNOSIS — E039 Hypothyroidism, unspecified: Secondary | ICD-10-CM | POA: Diagnosis not present

## 2015-12-21 NOTE — Patient Instructions (Signed)
Medication Instructions: - Your physician recommends that you continue on your current medications as directed. Please refer to the Current Medication list given to you today.  Labwork: - Your physician recommends that you return for lab work in: 3 months (TSH/ Liver)- Nira Conn, RN will call you closer to time to schedule this.  Procedures/Testing: - none  Follow-Up: - Remote monitoring is used to monitor your Pacemaker of ICD from home. This monitoring reduces the number of office visits required to check your device to one time per year. It allows [place] to keep an eye on the functioning of your device to ensure it is working properly. You are scheduled for a device check from home on 03/21/16. You may send your transmission at any time that day. If you have a wireless device, the transmission will be sent automatically. After your physician reviews your transmission, you will receive a postcard with your next transmission date.  - Your physician wants you to follow-up in: 9 months with Dr. Caryl Comes. You will receive a reminder letter in the mail two months in advance. If you don't receive a letter, please call our office to schedule the follow-up appointment.  Any Additional Special Instructions Will Be Listed Below (If Applicable).     If you need a refill on your cardiac medications before your next appointment, please call your pharmacy.

## 2015-12-21 NOTE — Progress Notes (Signed)
Electrophysiology Office Note   Date:  12/21/2015   ID:  Ernest [name], DOB September 06, 1935, MRN [id number]  PCP:  [name], MD  Cardiologist:  [id number] Primary Electrophysiologist:   Virl Axe, MD    Chief Complaint  Patient presents with  . other    6 wk f/u. Meds reviewed verbally with pt.     History of Present Illness: Ernest [name] is a 80 y.o. male seen in electrophysiology followup  for  atrial fibrillation-paroxysmal with rapid ventricular response associated with sinus bradycardia. He is status post pacemaker 2/17.  He is managed with apixaban.  We have discussed dofetilide and amiodarone and pacing as various contributors to a strategy.  We elected to start amiodarone  DATE TEST    9/16 echo     EF55-60  mild LAE                 He has a history of coronary disease. But no chest pain or shortness of breath or edema    Patient denies symptoms of GI intolerance, sun sensitivity, neurological symptoms attributable to amiodarone.  Surveillance laboratories will be checked 3 mon; they were normal 3/17   His chart describes catheterization 2015.   It demonstrated Multiple70% mid marginal as well as 20-30% disease diffusely.  Past Medical History  Diagnosis Date  . Thyroid disease   . Hyperlipidemia   . BPH (benign prostatic hypertrophy)   . Essential hypertension   . Aortic atherosclerosis ([place])   . PVD (peripheral vascular disease) ([place])   . Stroke ([place])   . PAF (paroxysmal atrial fibrillation) ([place])     a. CHA2DS2VASc = 7-->eliquis;  b. 03/2015 Echo: EF 55-60%, Gr1 DD, mild MR, mildly dil LA, PASP 80mmHg.  Marland Kitchen Coronary artery disease     a. Cath 12/2013->[facility]->Med rx.  Marland Kitchen GERD (gastroesophageal reflux disease)   . Pneumonia     as a child  . Rosacea   . 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 Deviated nasal septum   . Hearing deficit     wears hearing aids  . Presence of permanent cardiac pacemaker 08/16/2015  . Diabetes mellitus without  complication ([place])     type 2  . Urine frequency   . Arthritis    Past Surgical History  Procedure Laterality Date  . Colonoscopy    . Prostate biopsy    . Cardiac catheterization      [facility] 12/24/13: 30% LM , pLAD, ostial CX. 20% mLAD, mRCA. 70% OM2 (small vessel). Med RX.   Marland Kitchen Cardiac catheterization      [facility]  . Cardiac catheter[facility]      [facility]  . Cataract extraction w/ intraocular lens implant      right  . Multiple tooth extractions    . Lumbar laminectomy/decompression microdiscectomy N/A 01/04/2015    Procedure: LUMBAR LAMINECTOMY/DECOMPRESSION MICRODISCECTOMY 3 LEVELS;  Surgeon: Newman Pies, MD;  Location: [facility];  Service: Neurosurgery;  Laterality: N/A;  L23 L34 L45 laminectomies and foraminotomies  . Ep implantable device N/A 08/16/2015    Procedure: Pacemaker Implant;  Surgeon: Deboraha Sprang, MD;  Location: [facility];  Service: Cardiovascular;  Laterality: N/A;     Current Outpatient Prescriptions  Medication Sig Dispense Refill  . amiodarone (PACERONE) 200 MG tablet Take 1 tablet (200 mg total) by mouth daily. 30 tablet 3  . apixaban (ELIQUIS) 5 MG TABS tablet Take 1 tablet (5 mg total) by mouth  2 (two) times daily. 14 tablet 1  . doxycycline (VIBRAMYCIN) 100 MG capsule Take 100 mg by mouth 2 (two) times daily.     Marland Kitchen levothyroxine (SYNTHROID, LEVOTHROID) 112 MCG tablet Take 112 mcg by mouth daily before breakfast.    . lovastatin (MEVACOR) 40 MG tablet Take 40 mg by mouth at bedtime.     . meclizine (ANTIVERT) 25 MG tablet Take 25 mg by mouth 3 (three) times daily as needed for dizziness or nausea.   0  . metFORMIN (GLUCOPHAGE) 500 MG tablet Take 500 mg by mouth 2 (two) times daily with a meal.     . metoprolol tartrate (LOPRESSOR) 25 MG tablet Take 1 & 1/2 tablets (37.5 mg) by mouth twice daily 270 tablet 3  . metronidazole (NORITATE) 1 % cream Apply 1 application topically daily.    . Multiple Vitamin (MULTIVITAMIN) tablet Take 1 tablet by mouth  daily.    . tamsulosin (FLOMAX) 0.4 MG CAPS capsule Take 0.4 mg by mouth 2 (two) times daily.     No current facility-administered medications for this visit.    Allergies:   Review of patient's allergies indicates no known allergies.   Social History:  The patient  reports that he has quit smoking. His smoking use included Cigarettes. He has never used smokeless tobacco. He reports that he does not drink alcohol or use illicit drugs.   Family History:  The patient's family history includes Cancer in his brother; Cancer - Lung in his brother, brother, and sister; Diabetes in his sister; Heart attack in his father and mother.    ROS:  Please see the history of present illness.   Otherwise, review of systems is negative .    PHYSICAL EXAM: VS:  BP 118/64 mmHg  Pulse 75  Ht 5\' 7"  (1.702 m)  Wt 168 lb 4 oz (76.318 kg)  BMI 26.35 kg/m2 , BMI Body mass index is 26.35 kg/(m^2). GEN: Well nourished, well developed, in no acute distress HEENT: normal Neck: JVD flat,   Cardiac: RRR;  no murmur  rubs, + S4  Respiratory:  clear to auscultation bilaterally, normal work of breathing Back without kyphosis or CVAT GI: soft, nontender, nondistended, + BS MS: no deformity or atrophy Skin: warm and dry,  Extremities No Clubbing cyanosis  Edema Neuro:  Strength and sensation are intact Psych: euthymic mood, full affect  EKG:  EKG is not ordered today.    Recent Labs: 08/11/2015: BUN 18; Creatinine, Ser 0.91; Hemoglobin 15.4; Platelets 168; Potassium 4.6; Sodium 136 09/21/2015: ALT 21; TSH 3.550    Lipid Panel     Component Value Date/Time   CHOL 128 08/11/2011 0448   TRIG 160 08/11/2011 0448   HDL 33* 08/11/2011 0448   VLDL 32 08/11/2011 0448   LDLCALC 63 08/11/2011 0448     Wt Readings from Last 3 Encounters:  12/21/15 168 lb 4 oz (76.318 kg)  09/21/15 168 lb 12 oz (76.544 kg)  08/16/15 170 lb (77.111 kg)      Other studies Reviewed:  ER records and labs   ASSESSMENT AND  PLAN:  Atrial fibrillation-paroxysmal  Coronary artery disease  Prior stroke  Sinus bradycardia  Without symptoms of ischemia  There is no intercurrent atrial fibrillation. We'll continue him on amiodarone     Signed, Virl Axe, MD  12/21/2015 12:06 PM     [place] [address] [place] [address] [phone] (office) [phone] (fax) we'll

## 2015-12-24 DIAGNOSIS — E039 Hypothyroidism, unspecified: Secondary | ICD-10-CM | POA: Diagnosis not present

## 2015-12-24 DIAGNOSIS — E119 Type 2 diabetes mellitus without complications: Secondary | ICD-10-CM | POA: Diagnosis not present

## 2015-12-24 DIAGNOSIS — E7800 Pure hypercholesterolemia, unspecified: Secondary | ICD-10-CM | POA: Diagnosis not present

## 2015-12-24 DIAGNOSIS — I739 Peripheral vascular disease, unspecified: Secondary | ICD-10-CM | POA: Diagnosis not present

## 2015-12-28 LAB — CUP PACEART INCLINIC DEVICE CHECK
Battery Remaining Longevity: 121 mo
Battery Voltage: 3.04 V
Brady Statistic AP VP Percent: 0.04 %
Brady Statistic AS VP Percent: 0 %
Brady Statistic AS VS Percent: 1.33 %
Date Time Interrogation Session: 20170613153953
Implantable Lead Implant Date: 20170206
Implantable Lead Model: 5076
Lead Channel Impedance Value: 399 Ohm
Lead Channel Impedance Value: 437 Ohm
Lead Channel Pacing Threshold Amplitude: 0.75 V
Lead Channel Pacing Threshold Pulse Width: 0.4 ms
Lead Channel Sensing Intrinsic Amplitude: 1.875 mV
Lead Channel Setting Pacing Amplitude: 2.5 V
Lead Channel Setting Pacing Pulse Width: 0.4 ms
MDC IDC LEAD IMPLANT DT: 20170206
MDC IDC LEAD LOCATION: 753859
MDC IDC LEAD LOCATION: 753860
MDC IDC MSMT LEADCHNL RA IMPEDANCE VALUE: 494 Ohm
MDC IDC MSMT LEADCHNL RA PACING THRESHOLD AMPLITUDE: 0.75 V
MDC IDC MSMT LEADCHNL RA PACING THRESHOLD PULSEWIDTH: 0.4 ms
MDC IDC MSMT LEADCHNL RV IMPEDANCE VALUE: 494 Ohm
MDC IDC MSMT LEADCHNL RV SENSING INTR AMPL: 11 mV
MDC IDC SET LEADCHNL RA PACING AMPLITUDE: 2 V
MDC IDC SET LEADCHNL RV SENSING SENSITIVITY: 2.8 mV
MDC IDC STAT BRADY AP VS PERCENT: 98.63 %
MDC IDC STAT BRADY RA PERCENT PACED: 98.67 %
MDC IDC STAT BRADY RV PERCENT PACED: 0.04 %

## 2016-01-27 ENCOUNTER — Encounter: Payer: Self-pay | Admitting: Internal Medicine

## 2016-01-27 DIAGNOSIS — H26491 Other secondary cataract, right eye: Secondary | ICD-10-CM | POA: Diagnosis not present

## 2016-03-14 ENCOUNTER — Telehealth: Payer: Self-pay | Admitting: *Deleted

## 2016-03-14 DIAGNOSIS — I4891 Unspecified atrial fibrillation: Secondary | ICD-10-CM

## 2016-03-14 NOTE — Telephone Encounter (Signed)
I called and spoke with the patient's wife (DPR) regarding TSH/ liver that are due. He will come this Friday 03/17/16 at 9:30 am for labs to be done.

## 2016-03-14 NOTE — Telephone Encounter (Signed)
-----   Message from Emily Filbert, RN sent at 12/21/2015 12:17 PM EDT ----- Patient needs tsh/ liver ~ 03/22/16. Call patient to schedule- [place] pt

## 2016-03-17 ENCOUNTER — Other Ambulatory Visit (INDEPENDENT_AMBULATORY_CARE_PROVIDER_SITE_OTHER): Payer: PPO

## 2016-03-17 DIAGNOSIS — I4891 Unspecified atrial fibrillation: Secondary | ICD-10-CM | POA: Diagnosis not present

## 2016-03-18 LAB — HEPATIC FUNCTION PANEL
ALK PHOS: 85 IU/L (ref 39–117)
ALT: 19 IU/L (ref 0–44)
AST: 20 IU/L (ref 0–40)
Albumin: 4 g/dL (ref 3.5–4.8)
BILIRUBIN TOTAL: 0.4 mg/dL (ref 0.0–1.2)
BILIRUBIN, DIRECT: 0.12 mg/dL (ref 0.00–0.40)
Total Protein: 6.5 g/dL (ref 6.0–8.5)

## 2016-03-18 LAB — TSH: TSH: 3.98 u[IU]/mL (ref 0.450–4.500)

## 2016-03-21 ENCOUNTER — Telehealth: Payer: Self-pay | Admitting: Cardiology

## 2016-03-21 ENCOUNTER — Ambulatory Visit (INDEPENDENT_AMBULATORY_CARE_PROVIDER_SITE_OTHER): Payer: PPO | Admitting: *Deleted

## 2016-03-21 DIAGNOSIS — I480 Paroxysmal atrial fibrillation: Secondary | ICD-10-CM

## 2016-03-21 DIAGNOSIS — Z950 Presence of cardiac pacemaker: Secondary | ICD-10-CM

## 2016-03-21 NOTE — Telephone Encounter (Signed)
LMOVM reminding pt to send remote transmission.   

## 2016-03-22 NOTE — Progress Notes (Signed)
Remote pacemaker transmission.   

## 2016-03-23 ENCOUNTER — Encounter: Payer: Self-pay | Admitting: Cardiology

## 2016-03-28 ENCOUNTER — Telehealth: Payer: Self-pay

## 2016-03-28 NOTE — Telephone Encounter (Signed)
Pt is aware and agreeable with results. pt states "well im glad something finally looks good.".

## 2016-03-30 LAB — CUP PACEART REMOTE DEVICE CHECK
Brady Statistic AP VP Percent: 0.04 %
Brady Statistic AP VS Percent: 98.86 %
Brady Statistic AS VP Percent: 0 %
Brady Statistic AS VS Percent: 1.1 %
Brady Statistic RV Percent Paced: 0.04 %
Date Time Interrogation Session: 20170912182653
Implantable Lead Implant Date: 20170206
Implantable Lead Location: 753859
Implantable Lead Model: 5076
Lead Channel Impedance Value: 418 Ohm
Lead Channel Impedance Value: 475 Ohm
Lead Channel Impedance Value: 570 Ohm
Lead Channel Pacing Threshold Amplitude: 0.75 V
Lead Channel Sensing Intrinsic Amplitude: 1.875 mV
Lead Channel Sensing Intrinsic Amplitude: 7.375 mV
Lead Channel Sensing Intrinsic Amplitude: 7.375 mV
Lead Channel Setting Pacing Amplitude: 2 V
Lead Channel Setting Pacing Amplitude: 2.5 V
Lead Channel Setting Pacing Pulse Width: 0.4 ms
Lead Channel Setting Sensing Sensitivity: 2.8 mV
MDC IDC LEAD IMPLANT DT: 20170206
MDC IDC LEAD LOCATION: 753860
MDC IDC MSMT BATTERY REMAINING LONGEVITY: 111 mo
MDC IDC MSMT BATTERY VOLTAGE: 3.03 V
MDC IDC MSMT LEADCHNL RA IMPEDANCE VALUE: 399 Ohm
MDC IDC MSMT LEADCHNL RA PACING THRESHOLD AMPLITUDE: 0.75 V
MDC IDC MSMT LEADCHNL RA PACING THRESHOLD PULSEWIDTH: 0.4 ms
MDC IDC MSMT LEADCHNL RA SENSING INTR AMPL: 1.875 mV
MDC IDC MSMT LEADCHNL RV PACING THRESHOLD PULSEWIDTH: 0.4 ms
MDC IDC STAT BRADY RA PERCENT PACED: 98.9 %

## 2016-05-08 DIAGNOSIS — D2262 Melanocytic nevi of left upper limb, including shoulder: Secondary | ICD-10-CM | POA: Diagnosis not present

## 2016-05-08 DIAGNOSIS — L570 Actinic keratosis: Secondary | ICD-10-CM | POA: Diagnosis not present

## 2016-05-08 DIAGNOSIS — D2261 Melanocytic nevi of right upper limb, including shoulder: Secondary | ICD-10-CM | POA: Diagnosis not present

## 2016-05-08 DIAGNOSIS — D225 Melanocytic nevi of trunk: Secondary | ICD-10-CM | POA: Diagnosis not present

## 2016-05-08 DIAGNOSIS — L718 Other rosacea: Secondary | ICD-10-CM | POA: Diagnosis not present

## 2016-05-08 DIAGNOSIS — C44212 Basal cell carcinoma of skin of right ear and external auricular canal: Secondary | ICD-10-CM | POA: Diagnosis not present

## 2016-05-08 DIAGNOSIS — X32XXXA Exposure to sunlight, initial encounter: Secondary | ICD-10-CM | POA: Diagnosis not present

## 2016-05-08 DIAGNOSIS — D485 Neoplasm of uncertain behavior of skin: Secondary | ICD-10-CM | POA: Diagnosis not present

## 2016-06-20 ENCOUNTER — Ambulatory Visit (INDEPENDENT_AMBULATORY_CARE_PROVIDER_SITE_OTHER): Payer: PPO | Admitting: *Deleted

## 2016-06-20 DIAGNOSIS — I480 Paroxysmal atrial fibrillation: Secondary | ICD-10-CM | POA: Diagnosis not present

## 2016-06-20 NOTE — Progress Notes (Signed)
Remote pacemaker transmission.   

## 2016-06-28 ENCOUNTER — Encounter: Payer: Self-pay | Admitting: Cardiology

## 2016-06-28 DIAGNOSIS — C44212 Basal cell carcinoma of skin of right ear and external auricular canal: Secondary | ICD-10-CM | POA: Diagnosis not present

## 2016-06-28 DIAGNOSIS — L905 Scar conditions and fibrosis of skin: Secondary | ICD-10-CM | POA: Diagnosis not present

## 2016-07-06 DIAGNOSIS — E039 Hypothyroidism, unspecified: Secondary | ICD-10-CM | POA: Diagnosis not present

## 2016-07-06 DIAGNOSIS — E7800 Pure hypercholesterolemia, unspecified: Secondary | ICD-10-CM | POA: Diagnosis not present

## 2016-07-06 DIAGNOSIS — E119 Type 2 diabetes mellitus without complications: Secondary | ICD-10-CM | POA: Diagnosis not present

## 2016-07-06 DIAGNOSIS — Z0000 Encounter for general adult medical examination without abnormal findings: Secondary | ICD-10-CM | POA: Diagnosis not present

## 2016-07-06 DIAGNOSIS — Z79899 Other long term (current) drug therapy: Secondary | ICD-10-CM | POA: Diagnosis not present

## 2016-07-07 LAB — CUP PACEART REMOTE DEVICE CHECK
Brady Statistic AP VP Percent: 0.04 %
Brady Statistic AP VS Percent: 98.8 %
Brady Statistic AS VP Percent: 0 %
Brady Statistic RA Percent Paced: 98.83 %
Brady Statistic RV Percent Paced: 0.04 %
Date Time Interrogation Session: 20171212173625
Implantable Lead Implant Date: 20170206
Implantable Lead Location: 753859
Implantable Lead Location: 753860
Implantable Lead Model: 5076
Implantable Lead Model: 5076
Lead Channel Impedance Value: 418 Ohm
Lead Channel Impedance Value: 475 Ohm
Lead Channel Pacing Threshold Pulse Width: 0.4 ms
Lead Channel Sensing Intrinsic Amplitude: 7.875 mV
Lead Channel Sensing Intrinsic Amplitude: 7.875 mV
Lead Channel Setting Pacing Amplitude: 2 V
Lead Channel Setting Pacing Amplitude: 2.5 V
Lead Channel Setting Pacing Pulse Width: 0.4 ms
MDC IDC LEAD IMPLANT DT: 20170206
MDC IDC MSMT BATTERY REMAINING LONGEVITY: 106 mo
MDC IDC MSMT BATTERY VOLTAGE: 3.02 V
MDC IDC MSMT LEADCHNL RA IMPEDANCE VALUE: 399 Ohm
MDC IDC MSMT LEADCHNL RA IMPEDANCE VALUE: 532 Ohm
MDC IDC MSMT LEADCHNL RA PACING THRESHOLD AMPLITUDE: 0.625 V
MDC IDC MSMT LEADCHNL RA SENSING INTR AMPL: 1.5 mV
MDC IDC MSMT LEADCHNL RA SENSING INTR AMPL: 1.5 mV
MDC IDC MSMT LEADCHNL RV PACING THRESHOLD AMPLITUDE: 0.75 V
MDC IDC MSMT LEADCHNL RV PACING THRESHOLD PULSEWIDTH: 0.4 ms
MDC IDC PG IMPLANT DT: 20170206
MDC IDC SET LEADCHNL RV SENSING SENSITIVITY: 2.8 mV
MDC IDC STAT BRADY AS VS PERCENT: 1.16 %

## 2016-07-11 DIAGNOSIS — D485 Neoplasm of uncertain behavior of skin: Secondary | ICD-10-CM | POA: Diagnosis not present

## 2016-07-11 DIAGNOSIS — C44311 Basal cell carcinoma of skin of nose: Secondary | ICD-10-CM | POA: Diagnosis not present

## 2016-08-01 ENCOUNTER — Telehealth: Payer: Self-pay | Admitting: Internal Medicine

## 2016-08-01 NOTE — Telephone Encounter (Signed)
°*  STAT* If patient is at the pharmacy, call can be transferred to refill team.   1. Which medications need to be refilled? (please list name of each medication and dose if known) Eliquis  5 mg po bid    Metoprolol 37.5 mg po bid   Amiodarone 200 mg po daily   2. Which pharmacy/location (including street and city if local pharmacy) is medication to be sent to? [facility]   3. Do they need a 30 day or 90 day supply? [place]

## 2016-08-02 ENCOUNTER — Other Ambulatory Visit: Payer: Self-pay | Admitting: Internal Medicine

## 2016-08-02 DIAGNOSIS — I480 Paroxysmal atrial fibrillation: Secondary | ICD-10-CM

## 2016-08-02 MED ORDER — APIXABAN 5 MG PO TABS
5.0000 mg | ORAL_TABLET | Freq: Two times a day (BID) | ORAL | 1 refills | Status: DC
Start: 2016-08-02 — End: 2016-08-09

## 2016-08-02 MED ORDER — METOPROLOL TARTRATE 25 MG PO TABS: ORAL_TABLET | ORAL | 1 refills | Status: DC

## 2016-08-02 MED ORDER — AMIODARONE HCL 200 MG PO TABS: 200.0000 mg | ORAL_TABLET | Freq: Every day | ORAL | 1 refills | Status: DC

## 2016-08-02 NOTE — Telephone Encounter (Signed)
Pt needs f/u appt with Dr. Caryl Comes. Thanks.

## 2016-08-02 NOTE — Telephone Encounter (Signed)
Refill sent.

## 2016-08-02 NOTE — Telephone Encounter (Signed)
Scheduled next available with klein 10/03/16 930 am.

## 2016-08-09 ENCOUNTER — Telehealth: Payer: Self-pay | Admitting: Internal Medicine

## 2016-08-09 DIAGNOSIS — I480 Paroxysmal atrial fibrillation: Secondary | ICD-10-CM

## 2016-08-09 MED ORDER — APIXABAN 5 MG PO TABS: 5.0000 mg | ORAL_TABLET | Freq: Two times a day (BID) | ORAL | 0 refills | Status: DC

## 2016-08-09 MED ORDER — AMIODARONE HCL 200 MG PO TABS: 200.0000 mg | ORAL_TABLET | Freq: Every day | ORAL | 0 refills | Status: DC

## 2016-08-09 MED ORDER — METOPROLOL TARTRATE 25 MG PO TABS: ORAL_TABLET | ORAL | 0 refills | Status: DC

## 2016-08-09 NOTE — Telephone Encounter (Signed)
Paceron 200 eliquis 5 Metoprolol 25  Pharmacy is asking if we can refill those medications for 90 day supplies  Please advise

## 2016-08-14 ENCOUNTER — Telehealth: Payer: Self-pay | Admitting: Internal Medicine

## 2016-08-14 NOTE — Telephone Encounter (Signed)
Metoprolol Rx sent to [place] no Refills pt due for appointment.

## 2016-08-14 NOTE — Telephone Encounter (Signed)
[facility] calling asking if we can please call back to give correct dose on Metoprolol  Please call back

## 2016-08-22 NOTE — Telephone Encounter (Signed)
Pt is coming to see Dr Caryl Comes in march

## 2016-08-29 DIAGNOSIS — C44311 Basal cell carcinoma of skin of nose: Secondary | ICD-10-CM | POA: Diagnosis not present

## 2016-08-29 DIAGNOSIS — L905 Scar conditions and fibrosis of skin: Secondary | ICD-10-CM | POA: Diagnosis not present

## 2016-08-31 IMAGING — MR MRI LUMBAR SPINE WITHOUT CONTRAST
4 of 5 series · 13 of 48 positions shown · non-contrast
Comparison: None.

CLINICAL DATA: Low back and bilateral leg pain for 1 month.

EXAM:
MRI LUMBAR SPINE WITHOUT CONTRAST
TECHNIQUE: Multiplanar, multisequence MR imaging of the lumbar spine was
performed. No intravenous contrast was administered.

[Series 2: T2 · sagittal · 4.0mm · 0.36mm/px · 4 of 17 slices shown (1 of 2)]
[im 1/17]
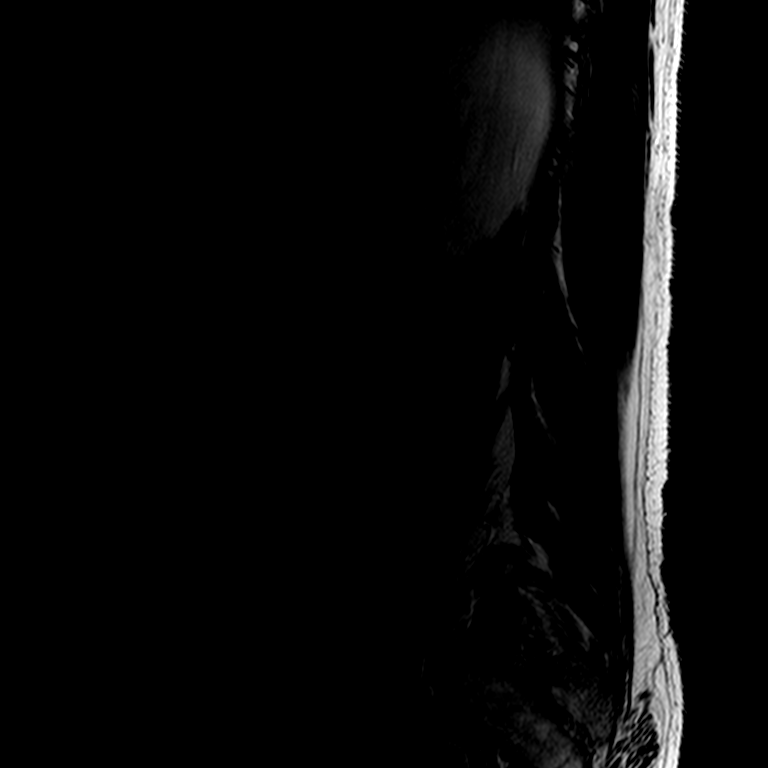
[im 4/17]
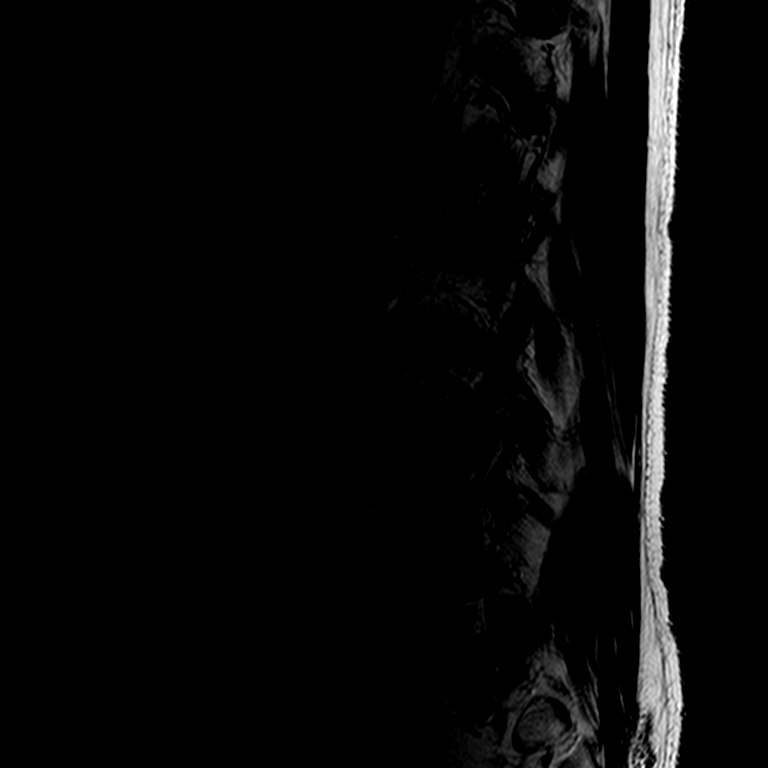
[im 10/17]
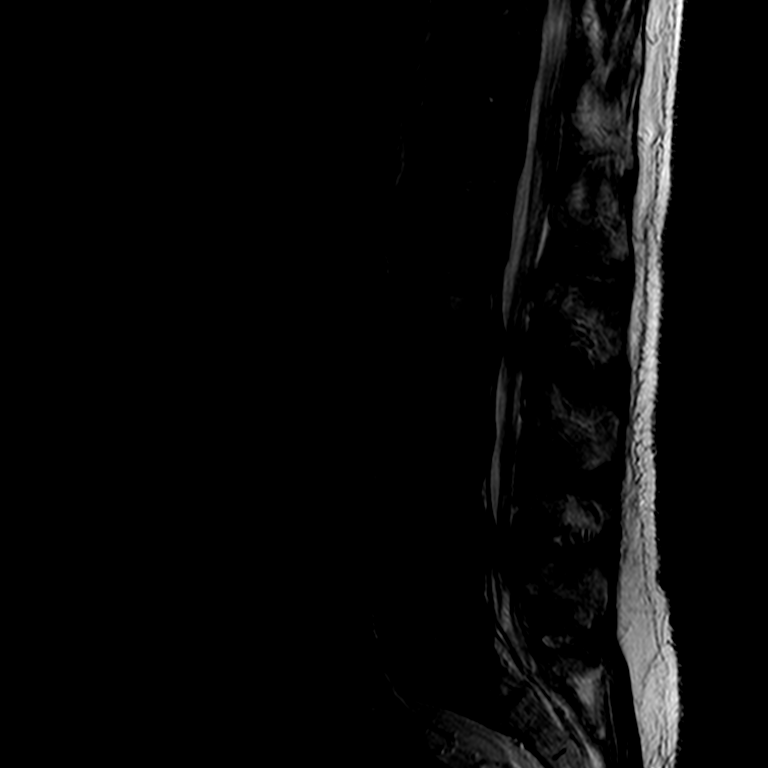
[im 17/17]
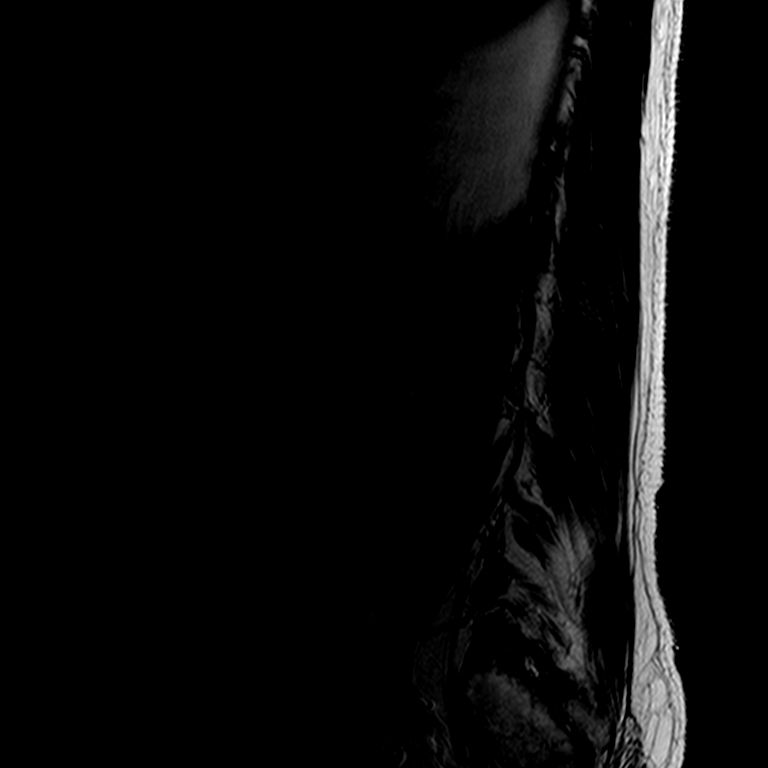

[Series 3: T1 · sagittal · 4.0mm · 0.44mm/px · 3 of 17 slices shown (1 of 2)]
[im 4/17]
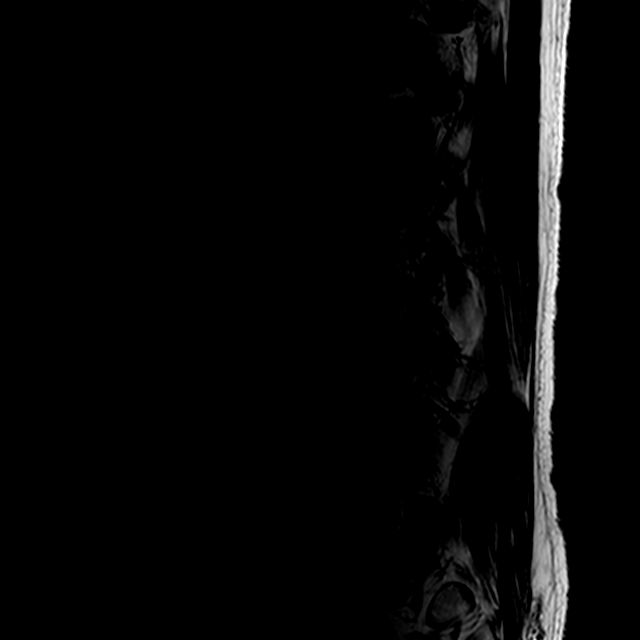
[im 10/17]
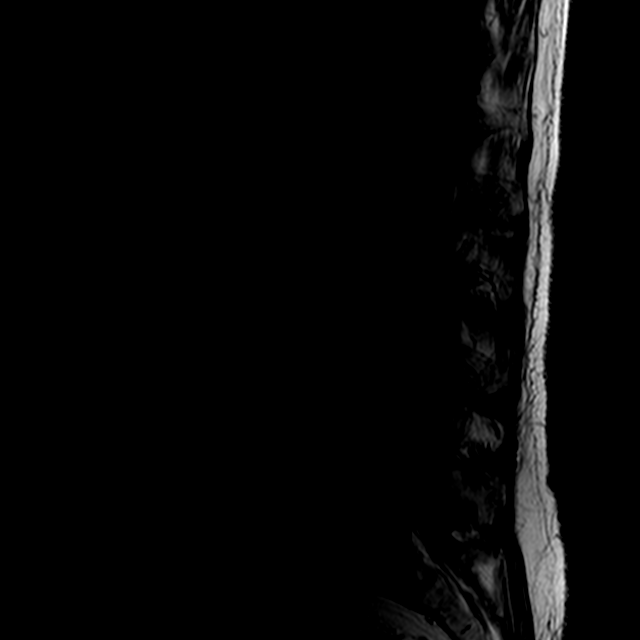
[im 17/17]
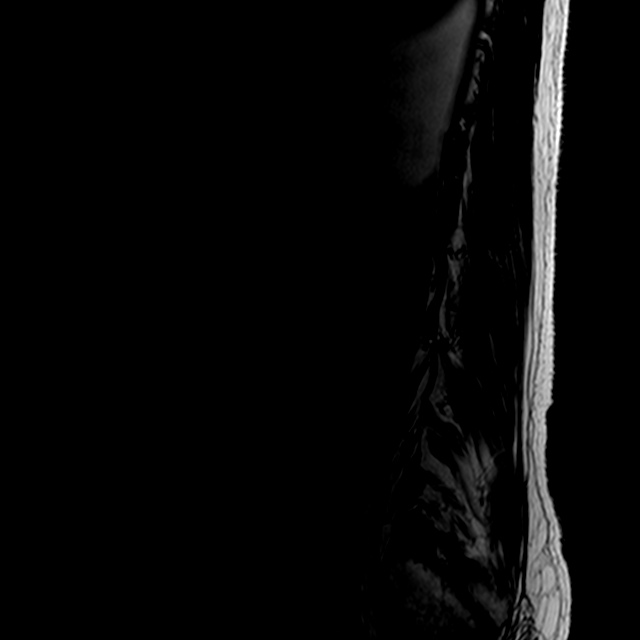

[Series 5: T2 · axial · 4.0mm · 0.39mm/px · z∈[-142,+20]mm · 3 of 42 slices shown (2 of 2)]
[im 6/42]
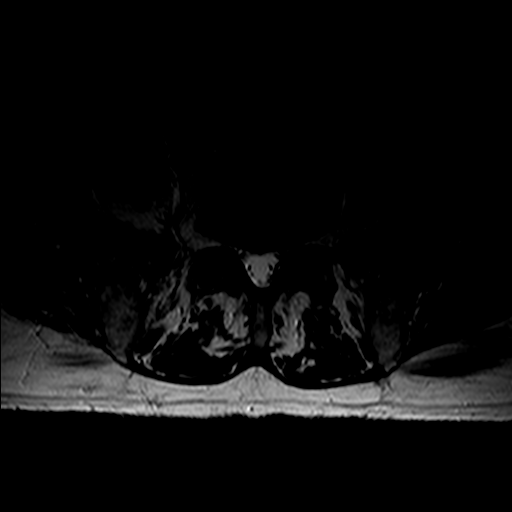
[im 21/42]
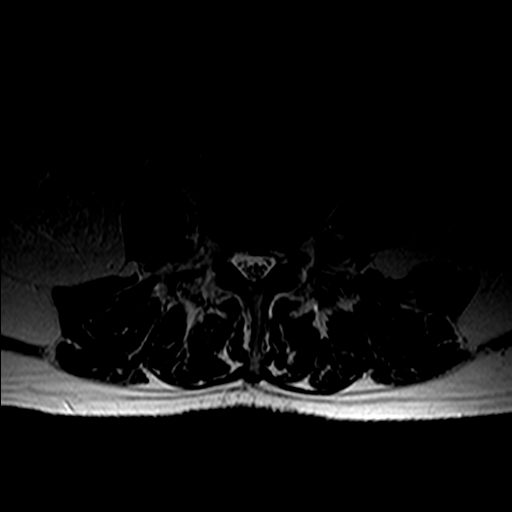
[im 36/42]
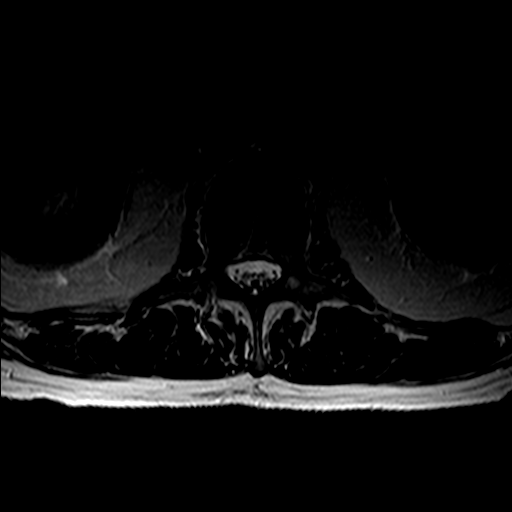

[Series 6: T1 · axial · 4.0mm · 0.39mm/px · z∈[-142,+20]mm · 3 of 42 slices shown (2 of 2)]
[im 6/42]
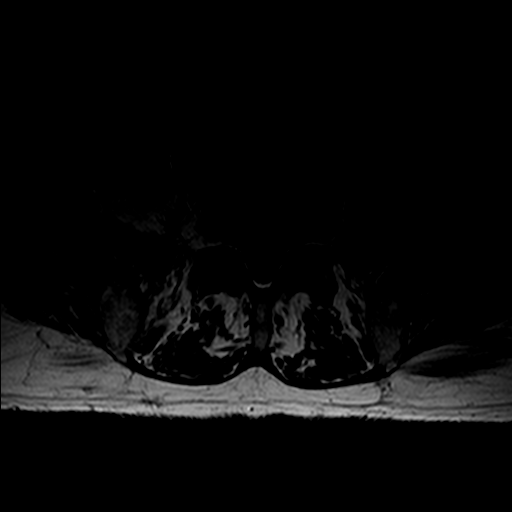
[im 21/42]
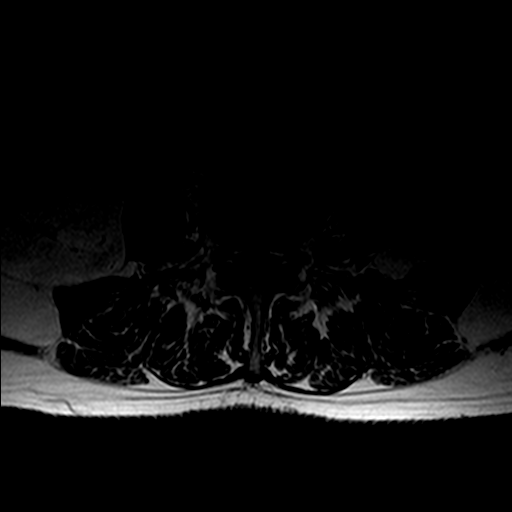
[im 36/42]
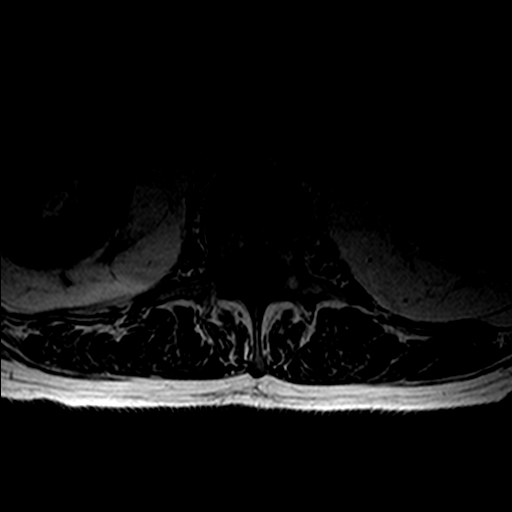

[13 of 48 positions shown; findings below may reference images not displayed]

FINDINGS: Vertebral body height is maintained. Trace anterolisthesis L2 on L3
is due to facet arthropathy. There is straightening of the normal
lumbar lordosis. The patient has a congenitally narrow central
spinal canal. No worrisome marrow lesion is identified. A few small
hemangiomas are incidentally noted. The conus medullaris is normal
in signal and position. Imaged intra-abdominal contents are
unremarkable.

The T11-12 level is imaged in the sagittal plane only and negative.

T12-L1:  Negative.

L1-2:  Negative.

L2-3: There is facet degenerative disease. The disc is uncovered and
bulging and mild ligamentum flavum thickening is present. Moderately
severe to severe congenital and acquired central canal and bilateral
lateral recess stenosis is present. The foramina are mildly
narrowed.

L3-4: Facet degenerative change and a shallow disc bulge are
identified. There is mild to moderate congenital and acquired
central canal narrowing. The foramina are open.

L4-5: Prominent ligamentum flavum thickening is identified and there
is a shallow broad-based disc bulge. The central canal and lateral
recesses are severely narrowed. Mild to moderate foraminal narrowing
is worse on the left.

L5-S1: Facet degenerative disease and a shallow disc bulge without
central canal or foraminal narrowing.
IMPRESSION: Multilevel spondylosis appearing worst at L4-5 where there is severe
congenital and acquired central canal narrowing.

Moderately severe to severe congenital and acquired central canal
narrowing L2-3.

Mild to moderate congenital acquired central canal narrowing L3-4.

## 2016-10-03 ENCOUNTER — Ambulatory Visit (INDEPENDENT_AMBULATORY_CARE_PROVIDER_SITE_OTHER): Payer: PPO | Admitting: Internal Medicine

## 2016-10-03 ENCOUNTER — Encounter: Payer: Self-pay | Admitting: Internal Medicine

## 2016-10-03 VITALS — BP 90/62 | HR 67 | Ht 67.0 in | Wt 172.5 lb

## 2016-10-03 DIAGNOSIS — Z950 Presence of cardiac pacemaker: Secondary | ICD-10-CM

## 2016-10-03 DIAGNOSIS — I480 Paroxysmal atrial fibrillation: Secondary | ICD-10-CM

## 2016-10-03 DIAGNOSIS — R001 Bradycardia, unspecified: Secondary | ICD-10-CM

## 2016-10-03 MED ORDER — AMIODARONE HCL 200 MG PO TABS: ORAL_TABLET | ORAL | Status: DC

## 2016-10-03 MED ORDER — METOPROLOL TARTRATE 25 MG PO TABS: ORAL_TABLET | ORAL | Status: DC

## 2016-10-03 NOTE — Patient Instructions (Signed)
Medication Instructions: - Your physician has recommended you make the following change in your medication:  1) Decrease amiodarone 200 mg- take one tablet by mouth FIVE days a week 2) Decrease metoprolol tartrate 25 mg- take one tablet by mouth twice daily  Labwork: - none ordered  Procedures/Testing: - none ordered  Follow-Up: - Your physician recommends that you schedule a follow-up appointment in: 3 months with Dr. Caryl Comes.  Any Additional Special Instructions Will Be Listed Below (If Applicable).     If you need a refill on your cardiac medications before your next appointment, please call your pharmacy.

## 2016-10-03 NOTE — Progress Notes (Signed)
Electrophysiology Office Note   Date:  10/03/2016   ID:  Ernest [name], DOB [birth year], MRN [id number]  PCP:  [name], MD  Cardiologist:  [facility] Primary Electrophysiologist:   Virl Axe, MD    Chief Complaint  Patient presents with  . other    9 month follow with pacemaker check. Meds reviewed by the pt. verbally.  "doing well."      History of Present Illness: Ernest [name] is a 81 y.o. male seen in electrophysiology followup  for  atrial fibrillation-paroxysmal with rapid ventricular response associated with sinus bradycardia. He is status post pacemaker 2/17.  He is managed with apixaban.  We have discussed dofetilide and amiodarone and pacing as various contributors to a strategy.  We elected to start amiodarone  DATE TEST    9/16 echo     EF55-60  mild LAE                 Date TSH LFTs PFTs  9/17  3.98 19    12/17  4.12 21       He has a history of coronary disease. But no chest pain or shortness of breath or edema   His wife biggest concern is his memory.  Patient denies symptoms of GI intolerance, sun sensitivity, neurological symptoms attributable to amiodarone.   He has developed a nonproductive cough.  His chart describes catheterization 2015.   It demonstrated Multiple70% mid marginal as well as 20-30% disease diffusely.  Past Medical History:  Diagnosis Date  . Aortic atherosclerosis ([place])   . Arthritis   . BPH (benign prostatic hypertrophy)   . Coronary artery disease    a. Cath 12/2013->[facility]->Med rx.  . Deviated nasal septum   . Diabetes mellitus without complication ([place])    type 2  . Essential hypertension   . GERD (gastroesophageal reflux disease)   . Hearing deficit    wears hearing aids  . Hyperlipidemia   . Lupus    discoid of scalp only  . PAF (paroxysmal atrial fibrillation) ([place])    a. CHA2DS2VASc = 7-->eliquis;  b. 03/2015 Echo: EF 55-60%, Gr1 DD, mild MR, mildly dil LA, PASP 53mmHg.  Marland Kitchen Pneumonia    as a child   . Presence of permanent cardiac pacemaker 08/16/2015  . PVD (peripheral vascular disease) ([place])   . Rosacea   . Stroke ([place])   . Thyroid disease   . Urine frequency    Past Surgical History:  Procedure Laterality Date  . CARDIAC CATHETERIZATION     [facility] 12/24/13: 30% LM , pLAD, ostial CX. 20% mLAD, mRCA. 70% OM2 (small vessel). Med RX.   Marland Kitchen CARDIAC CATHETERIZATION     [facility]  . CARD[facility]IZATION     [facility]  . CATARACT EXTRACTION W/ INTRAOCULAR LENS IMPLANT     right  . COLONOSCOPY    . EP IMPLANTABLE DEVICE N/A 08/16/2015   Procedure: Pacemaker Implant;  Surgeon: Deboraha Sprang, MD;  Location: [place] CV LAB;  Service: Cardiovascular;  Laterality: N/A;  . LUMBAR LAMINECTOMY/DECOMPRESSION MICRODISCECTOMY N/A 01/04/2015   Procedure: LUMBAR LAMINECTOMY/DECOMPRESSION MICRODISCECTOMY 3 LEVELS;  Surgeon: Newman Pies, MD;  Location: [place] NEURO ORS;  Service: Neurosurgery;  Laterality: N/A;  L23 L34 L45 laminectomies and foraminotomies  . MULTIPLE TOOTH EXTRACTIONS    . PROSTATE BIOPSY       Current Outpatient Prescriptions  Medication Sig Dispense Refill  . amiodarone (PACERONE) 200 MG tablet Take 1 tablet (200 mg total) by mouth daily.  90 tablet 0  . apixaban (ELIQUIS) 5 MG TABS tablet Take 1 tablet (5 mg total) by mouth 2 (two) times daily. 180 tablet 0  . doxycycline (VIBRAMYCIN) 100 MG capsule Take 100 mg by mouth 2 (two) times daily.     Marland Kitchen levothyroxine (SYNTHROID, LEVOTHROID) 112 MCG tablet Take 112 mcg by mouth daily before breakfast.    . lovastatin (MEVACOR) 40 MG tablet Take 40 mg by mouth at bedtime.     . metFORMIN (GLUCOPHAGE) 500 MG tablet Take 500 mg by mouth 2 (two) times daily with a meal.     . metoprolol tartrate (LOPRESSOR) 25 MG tablet Take 1 & 1/2 tablets (37.5 mg) by mouth twice daily 90 tablet 0  . metronidazole (NORITATE) 1 % cream Apply 1 application topically daily.    . Multiple Vitamin (MULTIVITAMIN) tablet Take 1 tablet by mouth daily.    . tamsulosin  (FLOMAX) 0.4 MG CAPS capsule Take 0.4 mg by mouth 2 (two) times daily.     No current facility-administered medications for this visit.     Allergies:   Patient has no known allergies.   Social History:  The patient  reports that he has quit smoking. His smoking use included Cigarettes. He has never used smokeless tobacco. He reports that he does not drink alcohol or use drugs.   Family History:  The patient's family history includes Cancer in his brother; Cancer - Lung in his brother, brother, and sister; Diabetes in his sister; Heart attack in his father and mother.    ROS:  Please see the history of present illness.   Otherwise, review of systems is negative .    PHYSICAL EXAM: VS:  BP 90/62 (BP Location: Left Arm, Patient Position: Sitting, Cuff Size: Normal)   Pulse 67   Ht 5\' 7"  (1.702 m)   Wt 172 lb 8 oz (78.2 kg)   BMI 27.02 kg/m  , BMI Body mass index is 27.02 kg/m. GEN: Well nourished, well developed, in no acute distress  HEENT: normal  Neck: JVD flat,   Cardiac: RRR;  no murmur  rubs, + S4  Respiratory:  clear to auscultation bilaterally, normal work of breathing Back without kyphosis or CVAT GI: soft, nontender, nondistended, + BS MS: no deformity or atrophy  Skin: warm and dry,  Extremities No Clubbing cyanosis  Edema Neuro:  Strength and sensation are intact Psych: euthymic mood, full affect  EKG: Personally reviewed atrially paced at 67 Intervals 24/09/41 Otherwise normal   Recent Labs: 03/17/2016: ALT 19; TSH 3.980  From care everywhere ALT 18  Lipid Panel     Component Value Date/Time   CHOL 128 08/11/2011 0448   TRIG 160 08/11/2011 0448   HDL 33 (L) 08/11/2011 0448   VLDL 32 08/11/2011 0448   LDLCALC 63 08/11/2011 0448     Wt Readings from Last 3 Encounters:  10/03/16 172 lb 8 oz (78.2 kg)  12/21/15 168 lb 4 oz (76.3 kg)  09/21/15 168 lb 12 oz (76.5 kg)      Other studies Reviewed:  ER records and labs   ASSESSMENT AND PLAN:  Atrial  fibrillation-paroxysmal  Coronary artery disease  Prior stroke  Sinus bradycardia  Amiodarone therapy  Cough  Dementia  Without symptoms of ischemia  There is no intercurrent atrial fibrillation. We'll continue him on amiodarone but we will decrease the dose to 5 days a week. Surveillance laboratories were normal 12/17.  I'm concerned about the cough is indicated manifestation of amiodarone  lung injury. He is not on an ACE inhibitor.  We will plan to see him again in a couple of months and if it has not abated we will undertake coronary function testing and possibly discontinue the amiodarone  Have encouraged his wife to get up with his primary care doctor concerning his dementia    Signed, Virl Axe, MD  10/03/2016 10:19 AM     [facility] [address] [place] [address] [phone] (office) [phone] (fax)

## 2016-10-11 ENCOUNTER — Telehealth: Payer: Self-pay | Admitting: Internal Medicine

## 2016-10-11 ENCOUNTER — Other Ambulatory Visit: Payer: Self-pay

## 2016-10-11 MED ORDER — METOPROLOL TARTRATE 25 MG PO TABS: ORAL_TABLET | ORAL | 0 refills | Status: DC

## 2016-10-11 MED ORDER — APIXABAN 5 MG PO TABS: 5.0000 mg | ORAL_TABLET | Freq: Two times a day (BID) | ORAL | 0 refills | Status: DC

## 2016-10-11 MED ORDER — AMIODARONE HCL 200 MG PO TABS: ORAL_TABLET | ORAL | 0 refills | Status: DC

## 2016-10-11 NOTE — Telephone Encounter (Signed)
°*  STAT* If patient is at the pharmacy, call can be transferred to refill team.   1. Which medications need to be refilled? (please list name of each medication and dose if known)        Amiodarone 200 mg po five days a week    Eliquis  5 mg po BID   Metoprolol 25 mg po BID   2. Which pharmacy/location (including street and city if local pharmacy) is medication to be sent to? [facility]    3. Do they need a 30 day or 90 day supply? [place]

## 2016-10-11 NOTE — Telephone Encounter (Signed)
Requested Prescriptions   Signed Prescriptions Disp Refills  . amiodarone (PACERONE) 200 MG tablet 90 tablet 0    Sig: Take one tablet (200 mg) by mouth five daily a week    Authorizing Provider: Deboraha Sprang    Ordering User: Janan Ridge apixaban (ELIQUIS) 5 MG TABS tablet 180 tablet 0    Sig: Take 1 tablet (5 mg total) by mouth 2 (two) times daily.    Authorizing Provider: Deboraha Sprang    Ordering User: Janan Ridge metoprolol tartrate (LOPRESSOR) 25 MG tablet 180 tablet 0    Sig: Take one tablet (25 mg) by mouth twice daily    Authorizing Provider: Deboraha Sprang    Ordering User: Janan Ridge

## 2016-10-16 ENCOUNTER — Telehealth: Payer: Self-pay | Admitting: Internal Medicine

## 2016-10-16 NOTE — Telephone Encounter (Signed)
Sarah at [facility] requests review of  new metoprolol and amiodarone doses. Confirmed metoprolol tartrate 25mg  BID and amiodarone 200mg  5 days per week.  Sarah verbalized understanding.

## 2016-10-16 NOTE — Telephone Encounter (Signed)
[facility] needs direction on Metoprolol and Amiodarone. Please call .

## 2016-10-17 ENCOUNTER — Other Ambulatory Visit: Payer: Self-pay | Admitting: Internal Medicine

## 2016-11-01 DIAGNOSIS — J069 Acute upper respiratory infection, unspecified: Secondary | ICD-10-CM | POA: Diagnosis not present

## 2016-11-01 LAB — CUP PACEART INCLINIC DEVICE CHECK
Brady Statistic AP VS Percent: 98.82 %
Brady Statistic AS VP Percent: 0 %
Brady Statistic AS VS Percent: 1.14 %
Brady Statistic RA Percent Paced: 98.85 %
Date Time Interrogation Session: 20180327140742
Implantable Lead Implant Date: 20170206
Implantable Lead Location: 753859
Implantable Lead Model: 5076
Lead Channel Impedance Value: 418 Ohm
Lead Channel Impedance Value: 513 Ohm
Lead Channel Pacing Threshold Amplitude: 0.75 V
Lead Channel Pacing Threshold Amplitude: 0.75 V
Lead Channel Pacing Threshold Pulse Width: 0.4 ms
Lead Channel Sensing Intrinsic Amplitude: 1.625 mV
Lead Channel Setting Pacing Amplitude: 2.5 V
Lead Channel Setting Pacing Pulse Width: 0.4 ms
Lead Channel Setting Sensing Sensitivity: 2.8 mV
MDC IDC LEAD IMPLANT DT: 20170206
MDC IDC LEAD LOCATION: 753860
MDC IDC MSMT BATTERY REMAINING LONGEVITY: 107 mo
MDC IDC MSMT BATTERY VOLTAGE: 3.02 V
MDC IDC MSMT LEADCHNL RA IMPEDANCE VALUE: 399 Ohm
MDC IDC MSMT LEADCHNL RA PACING THRESHOLD PULSEWIDTH: 0.4 ms
MDC IDC MSMT LEADCHNL RV IMPEDANCE VALUE: 456 Ohm
MDC IDC MSMT LEADCHNL RV SENSING INTR AMPL: 10.5 mV
MDC IDC PG IMPLANT DT: 20170206
MDC IDC SET LEADCHNL RA PACING AMPLITUDE: 2 V
MDC IDC STAT BRADY AP VP PERCENT: 0.04 %
MDC IDC STAT BRADY RV PERCENT PACED: 0.04 %

## 2016-11-13 DIAGNOSIS — M48061 Spinal stenosis, lumbar region without neurogenic claudication: Secondary | ICD-10-CM | POA: Diagnosis not present

## 2016-11-28 DIAGNOSIS — L570 Actinic keratosis: Secondary | ICD-10-CM | POA: Diagnosis not present

## 2016-11-28 DIAGNOSIS — L821 Other seborrheic keratosis: Secondary | ICD-10-CM | POA: Diagnosis not present

## 2016-11-28 DIAGNOSIS — X32XXXA Exposure to sunlight, initial encounter: Secondary | ICD-10-CM | POA: Diagnosis not present

## 2016-11-28 DIAGNOSIS — Z85828 Personal history of other malignant neoplasm of skin: Secondary | ICD-10-CM | POA: Diagnosis not present

## 2016-11-28 DIAGNOSIS — Z08 Encounter for follow-up examination after completed treatment for malignant neoplasm: Secondary | ICD-10-CM | POA: Diagnosis not present

## 2016-12-22 ENCOUNTER — Other Ambulatory Visit: Payer: Self-pay | Admitting: Internal Medicine

## 2016-12-29 DIAGNOSIS — E119 Type 2 diabetes mellitus without complications: Secondary | ICD-10-CM | POA: Diagnosis not present

## 2016-12-29 DIAGNOSIS — Z79899 Other long term (current) drug therapy: Secondary | ICD-10-CM | POA: Diagnosis not present

## 2017-01-02 ENCOUNTER — Ambulatory Visit (INDEPENDENT_AMBULATORY_CARE_PROVIDER_SITE_OTHER): Payer: PPO | Admitting: Internal Medicine

## 2017-01-02 ENCOUNTER — Encounter: Payer: Self-pay | Admitting: Internal Medicine

## 2017-01-02 VITALS — BP 96/62 | HR 73 | Ht 67.0 in | Wt 171.2 lb

## 2017-01-02 DIAGNOSIS — I480 Paroxysmal atrial fibrillation: Secondary | ICD-10-CM | POA: Diagnosis not present

## 2017-01-02 DIAGNOSIS — R001 Bradycardia, unspecified: Secondary | ICD-10-CM | POA: Diagnosis not present

## 2017-01-02 DIAGNOSIS — Z79899 Other long term (current) drug therapy: Secondary | ICD-10-CM | POA: Diagnosis not present

## 2017-01-02 DIAGNOSIS — Z950 Presence of cardiac pacemaker: Secondary | ICD-10-CM

## 2017-01-02 LAB — CUP PACEART INCLINIC DEVICE CHECK
Brady Statistic AP VS Percent: 93.4 %
Brady Statistic AS VP Percent: 0 %
Brady Statistic AS VS Percent: 6.56 %
Brady Statistic RA Percent Paced: 93.44 %
Date Time Interrogation Session: 20180626164006
Implantable Lead Implant Date: 20170206
Implantable Lead Location: 753859
Implantable Lead Model: 5076
Lead Channel Impedance Value: 380 Ohm
Lead Channel Impedance Value: 399 Ohm
Lead Channel Impedance Value: 513 Ohm
Lead Channel Pacing Threshold Amplitude: 0.75 V
Lead Channel Pacing Threshold Pulse Width: 0.4 ms
Lead Channel Sensing Intrinsic Amplitude: 10.875 mV
Lead Channel Setting Pacing Amplitude: 2 V
Lead Channel Setting Pacing Amplitude: 2.5 V
Lead Channel Setting Pacing Pulse Width: 0.4 ms
Lead Channel Setting Sensing Sensitivity: 2.8 mV
MDC IDC LEAD IMPLANT DT: 20170206
MDC IDC LEAD LOCATION: 753860
MDC IDC MSMT BATTERY REMAINING LONGEVITY: 105 mo
MDC IDC MSMT BATTERY VOLTAGE: 3.02 V
MDC IDC MSMT LEADCHNL RA PACING THRESHOLD AMPLITUDE: 0.75 V
MDC IDC MSMT LEADCHNL RA PACING THRESHOLD PULSEWIDTH: 0.4 ms
MDC IDC MSMT LEADCHNL RA SENSING INTR AMPL: 1.75 mV
MDC IDC MSMT LEADCHNL RV IMPEDANCE VALUE: 456 Ohm
MDC IDC PG IMPLANT DT: 20170206
MDC IDC STAT BRADY AP VP PERCENT: 0.04 %
MDC IDC STAT BRADY RV PERCENT PACED: 0.04 %

## 2017-01-02 MED ORDER — AMIODARONE HCL 200 MG PO TABS: ORAL_TABLET | ORAL | Status: DC

## 2017-01-02 NOTE — Patient Instructions (Signed)
Medication Instructions: - Your physician has recommended you make the following change in your medication:  1) Stop lopressor (metoprolol tartrate) 2) Decrease amiodarone 200 mg- take 1/2 tablet (100 mg) once daily  Labwork: - none ordered  Procedures/Testing: - none ordered  Follow-Up: - Remote monitoring is used to monitor your Pacemaker of ICD from home. This monitoring reduces the number of office visits required to check your device to one time per year. It allows [place] to keep an eye on the functioning of your device to ensure it is working properly. You are scheduled for a device check from home on 04/03/17. You may send your transmission at any time that day. If you have a wireless device, the transmission will be sent automatically. After your physician reviews your transmission, you will receive a postcard with your next transmission date.  - Your physician wants you to follow-up in: 6 months with Dr. Caryl Comes. You will receive a reminder letter in the mail two months in advance. If you don't receive a letter, please call our office to schedule the follow-up appointment.   Any Additional Special Instructions Will Be Listed Below (If Applicable).     If you need a refill on your cardiac medications before your next appointment, please call your pharmacy.

## 2017-01-02 NOTE — Progress Notes (Signed)
Electrophysiology Office Note   Date:  01/02/2017   ID:  Ernest [name], DOB May 29, 1936, MRN [id number]  PCP:  Derinda Late, MD  Cardiologist:  [facility] Primary Electrophysiologist:   Virl Axe, MD    Chief Complaint  Patient presents with  . other    3 month f/u no complaints today. Meds reviewed verbally with pt.     History of Present Illness: Ernest [name] is a 81 y.o. male seen in electrophysiology followup  for  atrial fibrillation-paroxysmal with rapid ventricular response associated with sinus bradycardia. He is status post pacemaker 2/17.  He is managed with apixaban.  We have discussed dofetilide and amiodarone and pacing as various contributors to a strategy.  We elected to start amiodarone  DATE TEST    9/16 echo     EF55-60  mild LAE                 Date TSH LFTs PFTs  9/17  3.98 19    12/17  4.12 21       He has a history of coronary disease.  The patient denies chest pain, shortness of breath, nocturnal dyspnea, orthopnea or peripheral edema.  There have been no palpitations, lightheadedness or syncope.   No lightheadedness despite low blood pressure. At home typically about 110   His cough his wife thinks that this is been better since we saw last  His chart describes catheterization 2015.   It demonstrated Multiple70% mid marginal as well as 20-30% disease diffusely.  Past Medical History:  Diagnosis Date  . Aortic atherosclerosis ([place])   . Arthritis   . BPH (benign prostatic hypertrophy)   . Coronary artery disease    a. Cath 12/2013->[facility]->Med rx.  . Deviated nasal septum   . Diabetes mellitus without complication ([place])    type 2  . Essential hypertension   . GERD (gastroesophageal reflux disease)   . Hearing deficit    wears hearing aids  . Hyperlipidemia   . Lupus    discoid of scalp only  . PAF (paroxysmal atrial fibrillation) ([place])    a. CHA2DS2VASc = 7-->eliquis;  b. 03/2015 Echo: EF 55-60%, Gr1 DD, mild MR, mildly dil  LA, PASP 50mmHg.  Marland Kitchen Pneumonia    as a child  . Presence of permanent cardiac pacemaker 08/16/2015  . PVD (peripheral vascular disease) ([place])   . Rosacea   . Stroke ([place])   . Thyroid disease   . Urine frequency    Past Surgical History:  Procedure Laterality Date  . CARDIAC CATHETERIZATION     [facility] 12/24/13: 30% LM , pLAD, ostial CX. 20% mLAD, mRCA. 70% OM2 (small vessel). Med RX.   Marland Kitchen CARDIAC CATHETERIZATION     [facility]  . CARD[facility]ATION     [facility]  . CATARACT EXTRACTION W/ INTRAOCULAR LENS IMPLANT     right  . COLONOSCOPY    . EP IMPLANTABLE DEVICE N/A 08/16/2015   Procedure: Pacemaker Implant;  Surgeon: Deboraha Sprang, MD;  Location: [place] CV LAB;  Service: Cardiovascular;  Laterality: N/A;  . LUMBAR LAMINECTOMY/DECOMPRESSION MICRODISCECTOMY N/A 01/04/2015   Procedure: LUMBAR LAMINECTOMY/DECOMPRESSION MICRODISCECTOMY 3 LEVELS;  Surgeon: Newman Pies, MD;  Location: [place] NEURO ORS;  Service: Neurosurgery;  Laterality: N/A;  L23 L34 L45 laminectomies and foraminotomies  . MULTIPLE TOOTH EXTRACTIONS    . PROSTATE BIOPSY       Current Outpatient Prescriptions  Medication Sig Dispense Refill  . amiodarone (PACERONE) 200 MG tablet Take 1 tablet  by mouth 5 days a week 64 tablet 3  . apixaban (ELIQUIS) 5 MG TABS tablet Take 1 tablet (5 mg total) by mouth 2 (two) times daily. 180 tablet 0  . doxycycline (VIBRAMYCIN) 100 MG capsule Take 100 mg by mouth 2 (two) times daily.     Marland Kitchen levothyroxine (SYNTHROID, LEVOTHROID) 112 MCG tablet Take 112 mcg by mouth daily before breakfast.    . lovastatin (MEVACOR) 40 MG tablet Take 40 mg by mouth at bedtime.     . metFORMIN (GLUCOPHAGE) 500 MG tablet Take 500 mg by mouth 2 (two) times daily with a meal.     . metoprolol tartrate (LOPRESSOR) 25 MG tablet Take one tablet (25 mg) by mouth twice daily 180 tablet 0  . metronidazole (NORITATE) 1 % cream Apply 1 application topically daily.    . Multiple Vitamin (MULTIVITAMIN) tablet Take 1 tablet  by mouth daily.    . tamsulosin (FLOMAX) 0.4 MG CAPS capsule Take 0.4 mg by mouth 2 (two) times daily.     No current facility-administered medications for this visit.     Allergies:   Patient has no known allergies.   Social History:  The patient  reports that he has quit smoking. His smoking use included Cigarettes. He has never used smokeless tobacco. He reports that he does not drink alcohol or use drugs.   Family History:  The patient's family history includes Cancer in his brother; Cancer - Lung in his brother, brother, and sister; Diabetes in his sister; Heart attack in his father and mother.    ROS:  Please see the history of present illness.   Otherwise, review of systems is negative .    PHYSICAL EXAM: VS:  BP 96/62 (BP Location: Left Arm, Patient Position: Sitting, Cuff Size: Normal)   Pulse 73   Ht 5\' 7"  (1.702 m)   Wt 171 lb 4 oz (77.7 kg)   BMI 26.82 kg/m  , BMI Body mass index is 26.82 kg/m. GEN: Well nourished, well developed, in no acute distress  HEENT: normal  Neck: JVD flat,   Cardiac: RRR;  no murmur  rubs, + S4  Respiratory:  clear to auscultation bilaterally, normal work of breathing Back without kyphosis or CVAT GI: soft, nontender, nondistended, + BS MS: no deformity or atrophy  Skin: warm and dry,  Extremities No Clubbing cyanosis  Edema Neuro:  Strength and sensation are intact Psych: euthymic mood, full affect  EKG: Personally reviewed atrially paced at 67 Intervals 24/09/41 Otherwise normal   Recent Labs: 03/17/2016: ALT 19; TSH 3.980  From care everywhere ALT 18  Lipid Panel     Component Value Date/Time   CHOL 128 08/11/2011 0448   TRIG 160 08/11/2011 0448   HDL 33 (L) 08/11/2011 0448   VLDL 32 08/11/2011 0448   LDLCALC 63 08/11/2011 0448     Wt Readings from Last 3 Encounters:  01/02/17 171 lb 4 oz (77.7 kg)  10/03/16 172 lb 8 oz (78.2 kg)  12/21/15 168 lb 4 oz (76.3 kg)      Other studies Reviewed:  ER records and  labs   ASSESSMENT AND PLAN:  Atrial fibrillation-paroxysmal  Coronary artery disease  Low blood pressure  Prior stroke  Sinus bradycardia  Amiodarone therapy  Cough  Dementia  Without symptoms of ischemia  Interval atrial fibrillation but without symptoms. Of relatively brief duration i.e. less than 10 seconds.  Cough is improved. At this point, we will continue amiodarone but will decrease from  200 mg 5 times a week--100 mg a day.  On Anticoagulation;  No bleeding issues   With his blood pressure being low, despite the absence of symptoms we'll discontinue metoprolol. Less good data for chronic stable coronary disease   Signed, Virl Axe, MD  01/02/2017 12:53 PM     [place] [address] [phone] (office) [phone] (fax)

## 2017-01-04 DIAGNOSIS — E7800 Pure hypercholesterolemia, unspecified: Secondary | ICD-10-CM | POA: Diagnosis not present

## 2017-01-04 DIAGNOSIS — E039 Hypothyroidism, unspecified: Secondary | ICD-10-CM | POA: Diagnosis not present

## 2017-01-04 DIAGNOSIS — E1165 Type 2 diabetes mellitus with hyperglycemia: Secondary | ICD-10-CM | POA: Diagnosis not present

## 2017-01-04 DIAGNOSIS — I480 Paroxysmal atrial fibrillation: Secondary | ICD-10-CM | POA: Diagnosis not present

## 2017-01-04 DIAGNOSIS — Z79899 Other long term (current) drug therapy: Secondary | ICD-10-CM | POA: Diagnosis not present

## 2017-01-04 DIAGNOSIS — I2510 Atherosclerotic heart disease of native coronary artery without angina pectoris: Secondary | ICD-10-CM | POA: Diagnosis not present

## 2017-01-30 ENCOUNTER — Other Ambulatory Visit: Payer: Self-pay

## 2017-01-30 MED ORDER — APIXABAN 5 MG PO TABS: 5.0000 mg | ORAL_TABLET | Freq: Two times a day (BID) | ORAL | 2 refills | Status: DC

## 2017-02-22 DIAGNOSIS — E119 Type 2 diabetes mellitus without complications: Secondary | ICD-10-CM | POA: Diagnosis not present

## 2017-03-02 ENCOUNTER — Telehealth: Payer: Self-pay | Admitting: *Deleted

## 2017-03-02 ENCOUNTER — Telehealth: Payer: Self-pay | Admitting: Internal Medicine

## 2017-03-02 NOTE — Telephone Encounter (Signed)
Left message on machine for patient/pt's wife to contact the office.

## 2017-03-02 NOTE — Telephone Encounter (Signed)
S/w pt's wife who reports patient has had pain 1-2 times today but feels fine at this time. Pain does not radiate, no other sx. Pt could be heard in the background stating he is pain free. Advised wife to call the on-call cardiologist this weekend with questions/concerns or if pain worsens, he should proceed to ER for an evaluation. Wife verbalized understanding and is appreciative of the call.

## 2017-03-02 NOTE — Telephone Encounter (Signed)
S/w pt's wife who reports pt had a "spell" yesterday while riding the mower and the tractor.  Per wife, he "goes at everything like he's got to hurry and get it done".  He could not breathe for a few seconds. Since then, he has had pain in his left side and upper chest. He can not rates the shooting pain but explains it as "pretty sharp" near his pacemaker.  He can not report how often stating "it just comes and goes" Denies left arm or jaw pain, nausea, diaphoresis .  Advised pt to submit transmission to device clinic and I will call them at this time. Pt's wife agreeable.  I s/w Terrence Dupont in the [facility]. She has not received a transmission.  Reports last transmission Dec 2017. She will call the pt/pt's wife now to further discuss needs and recommendations.

## 2017-03-02 NOTE — Telephone Encounter (Signed)
Pt c/o of Chest Pain: STAT if CP now or developed within 24 hours  1. Are you having CP right now? Yes, but not continual  2. Are you experiencing any other symptoms (ex. SOB, nausea, vomiting, sweating)? Yesterday, some SOB while mowing  3. How long have you been experiencing CP? yesterday  4. Is your CP continuous or coming and going? Comes and goes  5. Have you taken Nitroglycerin? no ?

## 2017-03-02 NOTE — Telephone Encounter (Signed)
Follow Up:; ° ° °Returning your call. °

## 2017-03-02 NOTE — Telephone Encounter (Signed)
Ivin Booty from [place] office calling regarding Mrs. Bel[place]ia calling to their office with vague c/o sharp pain at pacemaker site and some episodes of SOB. They said they sent a transmission- we have not received it.  I LM on mobile phone to return call to device clinic to help with transmission.

## 2017-03-02 NOTE — Telephone Encounter (Signed)
Spoke with Ernest [name], walked her through sending remote pacemaker transmission. Normal device function, no episodes, appropriate rate histograms. Ernest [name] says that [name] can't really remember the spells of pain but that they "come and go."  She viewed the pacemaker pocket and says that it looks normal, denies redness, swelling or drainage.

## 2017-04-03 ENCOUNTER — Telehealth: Payer: Self-pay | Admitting: Cardiology

## 2017-04-03 ENCOUNTER — Ambulatory Visit (INDEPENDENT_AMBULATORY_CARE_PROVIDER_SITE_OTHER): Payer: PPO | Admitting: *Deleted

## 2017-04-03 DIAGNOSIS — R001 Bradycardia, unspecified: Secondary | ICD-10-CM

## 2017-04-03 NOTE — Telephone Encounter (Signed)
Spoke with pt and reminded pt of remote transmission that is due today. Pt verbalized understanding.   

## 2017-04-03 NOTE — Progress Notes (Signed)
Remote pacemaker transmission.   

## 2017-04-05 ENCOUNTER — Encounter: Payer: Self-pay | Admitting: Cardiology

## 2017-04-05 LAB — CUP PACEART REMOTE DEVICE CHECK
Battery Remaining Longevity: 105 mo
Battery Voltage: 3.02 V
Brady Statistic AP VS Percent: 47.8 %
Brady Statistic AS VS Percent: 52.16 %
Date Time Interrogation Session: 20180925152214
Implantable Lead Implant Date: 20170206
Implantable Lead Location: 753859
Implantable Pulse Generator Implant Date: 20170206
Lead Channel Pacing Threshold Amplitude: 0.75 V
Lead Channel Pacing Threshold Amplitude: 0.875 V
Lead Channel Sensing Intrinsic Amplitude: 2 mV
Lead Channel Sensing Intrinsic Amplitude: 2 mV
Lead Channel Sensing Intrinsic Amplitude: 7.875 mV
Lead Channel Setting Pacing Amplitude: 2.5 V
Lead Channel Setting Sensing Sensitivity: 2.8 mV
MDC IDC LEAD IMPLANT DT: 20170206
MDC IDC LEAD LOCATION: 753860
MDC IDC MSMT LEADCHNL RA IMPEDANCE VALUE: 399 Ohm
MDC IDC MSMT LEADCHNL RA IMPEDANCE VALUE: 513 Ohm
MDC IDC MSMT LEADCHNL RA PACING THRESHOLD PULSEWIDTH: 0.4 ms
MDC IDC MSMT LEADCHNL RV IMPEDANCE VALUE: 399 Ohm
MDC IDC MSMT LEADCHNL RV IMPEDANCE VALUE: 437 Ohm
MDC IDC MSMT LEADCHNL RV PACING THRESHOLD PULSEWIDTH: 0.4 ms
MDC IDC MSMT LEADCHNL RV SENSING INTR AMPL: 7.875 mV
MDC IDC SET LEADCHNL RA PACING AMPLITUDE: 2 V
MDC IDC SET LEADCHNL RV PACING PULSEWIDTH: 0.4 ms
MDC IDC STAT BRADY AP VP PERCENT: 0.03 %
MDC IDC STAT BRADY AS VP PERCENT: 0.02 %
MDC IDC STAT BRADY RA PERCENT PACED: 47.82 %
MDC IDC STAT BRADY RV PERCENT PACED: 0.05 %

## 2017-07-05 ENCOUNTER — Telehealth: Payer: Self-pay | Admitting: Cardiology

## 2017-07-05 ENCOUNTER — Ambulatory Visit (INDEPENDENT_AMBULATORY_CARE_PROVIDER_SITE_OTHER): Payer: PPO | Admitting: *Deleted

## 2017-07-05 DIAGNOSIS — R001 Bradycardia, unspecified: Secondary | ICD-10-CM | POA: Diagnosis not present

## 2017-07-05 NOTE — Telephone Encounter (Signed)
Spoke with pt and reminded pt of remote transmission that is due today. Pt verbalized understanding.   

## 2017-07-06 ENCOUNTER — Telehealth: Payer: Self-pay | Admitting: Internal Medicine

## 2017-07-06 ENCOUNTER — Encounter: Payer: Self-pay | Admitting: Cardiology

## 2017-07-06 DIAGNOSIS — Z79899 Other long term (current) drug therapy: Secondary | ICD-10-CM | POA: Diagnosis not present

## 2017-07-06 DIAGNOSIS — E039 Hypothyroidism, unspecified: Secondary | ICD-10-CM | POA: Diagnosis not present

## 2017-07-06 DIAGNOSIS — E1165 Type 2 diabetes mellitus with hyperglycemia: Secondary | ICD-10-CM | POA: Diagnosis not present

## 2017-07-06 DIAGNOSIS — E7800 Pure hypercholesterolemia, unspecified: Secondary | ICD-10-CM | POA: Diagnosis not present

## 2017-07-06 LAB — CUP PACEART REMOTE DEVICE CHECK
Brady Statistic AP VP Percent: 0.02 %
Brady Statistic AP VS Percent: 49.35 %
Brady Statistic AS VP Percent: 0.02 %
Brady Statistic RA Percent Paced: 49.37 %
Implantable Lead Implant Date: 20170206
Implantable Lead Location: 753859
Implantable Lead Location: 753860
Implantable Lead Model: 5076
Implantable Lead Model: 5076
Lead Channel Impedance Value: 380 Ohm
Lead Channel Impedance Value: 380 Ohm
Lead Channel Pacing Threshold Pulse Width: 0.4 ms
Lead Channel Pacing Threshold Pulse Width: 0.4 ms
Lead Channel Sensing Intrinsic Amplitude: 1.25 mV
Lead Channel Sensing Intrinsic Amplitude: 7.75 mV
Lead Channel Sensing Intrinsic Amplitude: 7.75 mV
Lead Channel Setting Pacing Amplitude: 2 V
Lead Channel Setting Pacing Amplitude: 2.5 V
Lead Channel Setting Pacing Pulse Width: 0.4 ms
MDC IDC LEAD IMPLANT DT: 20170206
MDC IDC MSMT BATTERY REMAINING LONGEVITY: 103 mo
MDC IDC MSMT BATTERY VOLTAGE: 3.02 V
MDC IDC MSMT LEADCHNL RA IMPEDANCE VALUE: 513 Ohm
MDC IDC MSMT LEADCHNL RA PACING THRESHOLD AMPLITUDE: 0.625 V
MDC IDC MSMT LEADCHNL RA SENSING INTR AMPL: 1.25 mV
MDC IDC MSMT LEADCHNL RV IMPEDANCE VALUE: 437 Ohm
MDC IDC MSMT LEADCHNL RV PACING THRESHOLD AMPLITUDE: 0.75 V
MDC IDC PG IMPLANT DT: 20170206
MDC IDC SESS DTM: 20181228133618
MDC IDC SET LEADCHNL RV SENSING SENSITIVITY: 2.8 mV
MDC IDC STAT BRADY AS VS PERCENT: 50.61 %
MDC IDC STAT BRADY RV PERCENT PACED: 0.04 %

## 2017-07-06 NOTE — Telephone Encounter (Signed)
Pt spouse calling stating they were not able to send a transmission in   And would like a call back for they do not know how to send one in   Please call back

## 2017-07-06 NOTE — Telephone Encounter (Signed)
Spoke w/ pt wife and instructed her how to send remote transmission w/ pt home monitor. Pt wife verbalized understanding.

## 2017-07-06 NOTE — Progress Notes (Signed)
Remote pacemaker transmission.   

## 2017-07-12 DIAGNOSIS — Z79899 Other long term (current) drug therapy: Secondary | ICD-10-CM | POA: Diagnosis not present

## 2017-07-12 DIAGNOSIS — E119 Type 2 diabetes mellitus without complications: Secondary | ICD-10-CM | POA: Diagnosis not present

## 2017-07-12 DIAGNOSIS — E7800 Pure hypercholesterolemia, unspecified: Secondary | ICD-10-CM | POA: Diagnosis not present

## 2017-07-12 DIAGNOSIS — G3184 Mild cognitive impairment, so stated: Secondary | ICD-10-CM | POA: Diagnosis not present

## 2017-07-12 DIAGNOSIS — Z0000 Encounter for general adult medical examination without abnormal findings: Secondary | ICD-10-CM | POA: Diagnosis not present

## 2017-07-17 ENCOUNTER — Encounter: Payer: Self-pay | Admitting: Internal Medicine

## 2017-07-17 ENCOUNTER — Ambulatory Visit (INDEPENDENT_AMBULATORY_CARE_PROVIDER_SITE_OTHER): Payer: PPO | Admitting: Internal Medicine

## 2017-07-17 VITALS — BP 100/50 | HR 68 | Ht 67.0 in | Wt 167.8 lb

## 2017-07-17 DIAGNOSIS — Z79899 Other long term (current) drug therapy: Secondary | ICD-10-CM | POA: Diagnosis not present

## 2017-07-17 DIAGNOSIS — I480 Paroxysmal atrial fibrillation: Secondary | ICD-10-CM | POA: Diagnosis not present

## 2017-07-17 DIAGNOSIS — R001 Bradycardia, unspecified: Secondary | ICD-10-CM

## 2017-07-17 DIAGNOSIS — Z950 Presence of cardiac pacemaker: Secondary | ICD-10-CM | POA: Diagnosis not present

## 2017-07-17 NOTE — Patient Instructions (Addendum)
Medication Instructions: - Your physician recommends that you continue on your current medications as directed. Please refer to the Current Medication list given to you today.  Labwork: - none ordered  Procedures/Testing: - none ordered  Follow-Up: - Remote monitoring is used to monitor your Pacemaker of ICD from home. This monitoring reduces the number of office visits required to check your device to one time per year. It allows [place] to keep an eye on the functioning of your device to ensure it is working properly. You are scheduled for a device check from home on 10/04/17. You may send your transmission at any time that day. If you have a wireless device, the transmission will be sent automatically. After your physician reviews your transmission, you will receive a postcard with your next transmission date.  - Your physician wants you to follow-up in: 6 months with Dr. Caryl Comes. You will receive a reminder letter in the mail two months in advance. If you don't receive a letter, please call our office to schedule the follow-up appointment.   Any Additional Special Instructions Will Be Listed Below (If Applicable).     If you need a refill on your cardiac medications before your next appointment, please call your pharmacy.

## 2017-07-17 NOTE — Progress Notes (Signed)
Electrophysiology Office Note   Date:  07/17/2017   ID:  Ernest [name], DOB 1936-06-26, MRN [id number]  PCP:  Derinda Late, MD  Cardiologist:  [facility] Electrophysiologist:   Virl Axe, MD    Chief Complaint  Patient presents with  . other    6 month follow up. Meds reviewed by the pt. verbally. "doing well."      History of Present Illness: Ernest [name] is a 82 y.o. male seen in electrophysiology followup  for  atrial fibrillation-paroxysmal with rapid ventricular response associated with sinus bradycardia. He is status post pacemaker 2/17 [employer]tr[employer].  He is managed with apixaban and amiodarone.  DATE TEST    2015 Cath  70% Marginal  9/16 echo EF55-60 mild LAE                Date TSH LFTs Cr K Hgb  9/17  3.98 19      12/17  4.12 21      12/18 5.016 15 0.8 4.3 14.3     He has a history of coronary disease.   But no chest pain. Denies edema sob,  His biggest problem is back pain.  It limits activity  No bleeding  Continues with a cough is nonproductive.  It is less than it was a few months ago.  His chart describes catheterization 2015.   It demonstrated Multiple70% mid marginal as well as 20-30% disease diffusely.  Past Medical History:  Diagnosis Date  . Aortic atherosclerosis ([place])   . Arthritis   . BPH (benign prostatic hypertrophy)   . Coronary artery disease    a. Cath 12/2013->[facility]->Med rx.  . Deviated nasal septum   . Diabetes mellitus without complication ([place])    type 2  . Essential hypertension   . GERD (gastroesophageal reflux disease)   . Hearing deficit    wears hearing aids  . Hyperlipidemia   . Lupus    discoid of scalp only  . PAF (paroxysmal atrial fibrillation) ([place])    a. CHA2DS2VASc = 7-->eliquis;  b. 03/2015 Echo: EF 55-60%, Gr1 DD, mild MR, mildly dil LA, PASP 23mmHg.  Marland Kitchen Pneumonia    as a child  . Presence of permanent cardiac pacemaker 08/16/2015  . PVD (peripheral vascular disease) ([place])   . Rosacea   .  Stroke ([place])   . Thyroid disease   . Urine frequency    Past Surgical History:  Procedure Laterality Date  . CARDIAC CATHETERIZATION     [facility] 12/24/13: 30% LM , pLAD, ostial CX. 20% mLAD, mRCA. 70% OM2 (small vessel). Med RX.   Marland Kitchen CARDIAC CATHETERIZATION     [facility]  . [facility]     [facility]  . CATARACT EXTRACTION W/ INTRAOCULAR LENS IMPLANT     right  . COLONOSCOPY    . EP IMPLANTABLE DEVICE N/A 08/16/2015   Procedure: Pacemaker Implant;  Surgeon: Deboraha Sprang, MD;  Location: [facility];  Service: Cardiovascular;  Laterality: N/A;  . LUMBAR LAMINECTOMY/DECOMPRESSION MICRODISCECTOMY N/A 01/04/2015   Procedure: LUMBAR LAMINECTOMY/DECOMPRESSION MICRODISCECTOMY 3 LEVELS;  Surgeon: Newman Pies, MD;  Location: [place] NEURO ORS;  Service: Neurosurgery;  Laterality: N/A;  L23 L34 L45 laminectomies and foraminotomies  . MULTIPLE TOOTH EXTRACTIONS    . PROSTATE BIOPSY       Current Outpatient Medications  Medication Sig Dispense Refill  . amiodarone (PACERONE) 200 MG tablet Take 1/2 tablet (100 mg) by mouth once daily    . apixaban (ELIQUIS) 5 MG TABS  tablet Take 1 tablet (5 mg total) by mouth 2 (two) times daily. 180 tablet 2  . doxycycline (VIBRAMYCIN) 100 MG capsule Take 100 mg by mouth 2 (two) times daily.     Marland Kitchen levothyroxine (SYNTHROID, LEVOTHROID) 112 MCG tablet Take 112 mcg by mouth daily before breakfast.    . lovastatin (MEVACOR) 40 MG tablet Take 40 mg by mouth at bedtime.     . metFORMIN (GLUCOPHAGE) 500 MG tablet Take 500 mg by mouth 2 (two) times daily with a meal.     . metronidazole (NORITATE) 1 % cream Apply 1 application topically daily.    . Multiple Vitamin (MULTIVITAMIN) tablet Take 1 tablet by mouth daily.    . tamsulosin (FLOMAX) 0.4 MG CAPS capsule Take 0.4 mg by mouth 2 (two) times daily.     No current facility-administered medications for this visit.     Allergies:   Patient has no known allergies.   Social History:  The patient  reports that he  has quit smoking. His smoking use included cigarettes. he has never used smokeless tobacco. He reports that he does not drink alcohol or use drugs.   Family History:  The patient's family history includes Cancer in his brother; Cancer - Lung in his brother, brother, and sister; Diabetes in his sister; Heart attack in his father and mother.    ROS:  Please see the history of present illness.   Otherwise, review of systems is negative .    PHYSICAL EXAM: VS:  BP (!) 100/50 (BP Location: Left Arm, Patient Position: Sitting, Cuff Size: Normal)   Pulse 68   Ht 5\' 7"  (1.702 m)   Wt 167 lb 12 oz (76.1 kg)   BMI 26.27 kg/m  , BMI Body mass index is 26.27 kg/m. Well developed and nourished in no acute distress HENT normal Neck supple with JVP-flat Clear Regular rate and rhythm, no murmurs or gallops Abd-soft with active BS No Clubbing cyanosis edema Skin-warm and dry A & Oriented  Grossly normal sensory and motor function   EKG: Personally reviewed sinus at 68 Interval 16/09/40      Lipid Panel     Component Value Date/Time   CHOL 128 08/11/2011 0448   TRIG 160 08/11/2011 0448   HDL 33 (L) 08/11/2011 0448   VLDL 32 08/11/2011 0448   LDLCALC 63 08/11/2011 0448     Wt Readings from Last 3 Encounters:  07/17/17 167 lb 12 oz (76.1 kg)  01/02/17 171 lb 4 oz (77.7 kg)  10/03/16 172 lb 8 oz (78.2 kg)      Other studies Reviewed:  outpt office records   ASSESSMENT AND PLAN:  Atrial fibrillation-paroxysmal  Coronary artery disease  Pacemaker-Medtronic  Low blood pressure  Prior stroke  Sinus bradycardia  Amiodarone therapy  Cough  Dementia  No symptoms of ischemia.  LDL is at goal 64 so we will continue moderate dose statins  Tolerating amiodarone.  The cough remains a concern early sign of pulmonary toxicity.  However, dyspnea is if anything a little bit better.  Continue to watch.  Surveillance laboratories 12/18 were within range  No bleeding on  apixaban.  He is euvolemic.   Signed, Virl Axe, MD  07/17/2017 11:56 AM     [facility] [address] [place] [address] [phone] (office) [phone] (fax)

## 2017-07-31 DIAGNOSIS — X32XXXA Exposure to sunlight, initial encounter: Secondary | ICD-10-CM | POA: Diagnosis not present

## 2017-07-31 DIAGNOSIS — D2261 Melanocytic nevi of right upper limb, including shoulder: Secondary | ICD-10-CM | POA: Diagnosis not present

## 2017-07-31 DIAGNOSIS — D0439 Carcinoma in situ of skin of other parts of face: Secondary | ICD-10-CM | POA: Diagnosis not present

## 2017-07-31 DIAGNOSIS — D485 Neoplasm of uncertain behavior of skin: Secondary | ICD-10-CM | POA: Diagnosis not present

## 2017-07-31 DIAGNOSIS — L718 Other rosacea: Secondary | ICD-10-CM | POA: Diagnosis not present

## 2017-07-31 DIAGNOSIS — L570 Actinic keratosis: Secondary | ICD-10-CM | POA: Diagnosis not present

## 2017-07-31 DIAGNOSIS — D2272 Melanocytic nevi of left lower limb, including hip: Secondary | ICD-10-CM | POA: Diagnosis not present

## 2017-07-31 DIAGNOSIS — D225 Melanocytic nevi of trunk: Secondary | ICD-10-CM | POA: Diagnosis not present

## 2017-08-20 ENCOUNTER — Telehealth: Payer: Self-pay | Admitting: Internal Medicine

## 2017-08-20 MED ORDER — APIXABAN 5 MG PO TABS: 5.0000 mg | ORAL_TABLET | Freq: Two times a day (BID) | ORAL | 2 refills | Status: DC

## 2017-08-20 NOTE — Telephone Encounter (Addendum)
Most recent labs 07/06/17 in [place].  Refill sent in.

## 2017-08-20 NOTE — Telephone Encounter (Signed)
Please review for refill, Thanks !  

## 2017-08-20 NOTE — Telephone Encounter (Signed)
°*  STAT* If patient is at the pharmacy, call can be transferred to refill team.   1. Which medications need to be refilled? (please list name of each medication and dose if known)  Eliquis   2. Which pharmacy/location (including street and city if local pharmacy) is medication to be sent to? [facility]   3. Do they need a 30 day or 90 day supply?  90 day

## 2017-08-21 DIAGNOSIS — H2512 Age-related nuclear cataract, left eye: Secondary | ICD-10-CM | POA: Diagnosis not present

## 2017-08-21 DIAGNOSIS — B370 Candidal stomatitis: Secondary | ICD-10-CM | POA: Diagnosis not present

## 2017-08-31 LAB — CUP PACEART INCLINIC DEVICE CHECK
Battery Remaining Longevity: 103 mo
Brady Statistic AP VS Percent: 46.8 %
Brady Statistic AS VP Percent: 0.02 %
Brady Statistic AS VS Percent: 53.16 %
Brady Statistic RA Percent Paced: 46.82 %
Brady Statistic RV Percent Paced: 0.04 %
Implantable Lead Implant Date: 20170206
Implantable Lead Location: 753859
Implantable Lead Model: 5076
Implantable Lead Model: 5076
Lead Channel Impedance Value: 380 Ohm
Lead Channel Impedance Value: 513 Ohm
Lead Channel Pacing Threshold Amplitude: 0.75 V
Lead Channel Pacing Threshold Pulse Width: 0.4 ms
Lead Channel Pacing Threshold Pulse Width: 0.4 ms
Lead Channel Sensing Intrinsic Amplitude: 11.875 mV
Lead Channel Setting Pacing Amplitude: 2.5 V
MDC IDC LEAD IMPLANT DT: 20170206
MDC IDC LEAD LOCATION: 753860
MDC IDC MSMT BATTERY VOLTAGE: 3.02 V
MDC IDC MSMT LEADCHNL RA SENSING INTR AMPL: 2.5 mV
MDC IDC MSMT LEADCHNL RV IMPEDANCE VALUE: 380 Ohm
MDC IDC MSMT LEADCHNL RV IMPEDANCE VALUE: 437 Ohm
MDC IDC MSMT LEADCHNL RV PACING THRESHOLD AMPLITUDE: 1 V
MDC IDC PG IMPLANT DT: 20170206
MDC IDC SESS DTM: 20190108181903
MDC IDC SET LEADCHNL RA PACING AMPLITUDE: 2 V
MDC IDC SET LEADCHNL RV PACING PULSEWIDTH: 0.4 ms
MDC IDC SET LEADCHNL RV SENSING SENSITIVITY: 2.8 mV
MDC IDC STAT BRADY AP VP PERCENT: 0.02 %

## 2017-09-04 DIAGNOSIS — D0439 Carcinoma in situ of skin of other parts of face: Secondary | ICD-10-CM | POA: Diagnosis not present

## 2017-09-04 DIAGNOSIS — L905 Scar conditions and fibrosis of skin: Secondary | ICD-10-CM | POA: Diagnosis not present

## 2017-10-04 ENCOUNTER — Encounter: Payer: PPO | Admitting: *Deleted

## 2017-10-05 ENCOUNTER — Encounter: Payer: Self-pay | Admitting: Cardiology

## 2017-10-09 ENCOUNTER — Telehealth: Payer: Self-pay

## 2017-10-09 NOTE — Telephone Encounter (Signed)
Received voice mail message from patients wife.  Patient missed sending remote transmission on 10/04/2017 and she needs a follow up call to let them know what they need to do.  Message routed to device clinic.

## 2017-10-09 NOTE — Telephone Encounter (Signed)
LVM on pts cell and home phone informing him that he could send a transmission today or tomorrow and I would schedule so the transmission would be processed

## 2017-10-10 ENCOUNTER — Ambulatory Visit (INDEPENDENT_AMBULATORY_CARE_PROVIDER_SITE_OTHER): Payer: PPO | Admitting: *Deleted

## 2017-10-10 DIAGNOSIS — R001 Bradycardia, unspecified: Secondary | ICD-10-CM | POA: Diagnosis not present

## 2017-10-10 NOTE — Progress Notes (Signed)
Remote pacemaker transmission.   

## 2017-10-11 ENCOUNTER — Encounter: Payer: Self-pay | Admitting: Cardiology

## 2017-10-23 LAB — CUP PACEART REMOTE DEVICE CHECK
Battery Remaining Longevity: 101 mo
Brady Statistic AP VS Percent: 38.78 %
Brady Statistic AS VP Percent: 0.02 %
Brady Statistic RA Percent Paced: 38.8 %
Brady Statistic RV Percent Paced: 0.04 %
Date Time Interrogation Session: 20190403133946
Implantable Lead Implant Date: 20170206
Implantable Lead Location: 753859
Implantable Lead Location: 753860
Implantable Lead Model: 5076
Lead Channel Impedance Value: 418 Ohm
Lead Channel Pacing Threshold Pulse Width: 0.4 ms
Lead Channel Sensing Intrinsic Amplitude: 6.875 mV
Lead Channel Sensing Intrinsic Amplitude: 6.875 mV
Lead Channel Setting Pacing Amplitude: 2 V
Lead Channel Setting Pacing Amplitude: 2.5 V
Lead Channel Setting Pacing Pulse Width: 0.4 ms
Lead Channel Setting Sensing Sensitivity: 2.8 mV
MDC IDC LEAD IMPLANT DT: 20170206
MDC IDC MSMT BATTERY VOLTAGE: 3.02 V
MDC IDC MSMT LEADCHNL RA IMPEDANCE VALUE: 361 Ohm
MDC IDC MSMT LEADCHNL RA IMPEDANCE VALUE: 475 Ohm
MDC IDC MSMT LEADCHNL RA PACING THRESHOLD AMPLITUDE: 0.5 V
MDC IDC MSMT LEADCHNL RA SENSING INTR AMPL: 1.75 mV
MDC IDC MSMT LEADCHNL RA SENSING INTR AMPL: 1.75 mV
MDC IDC MSMT LEADCHNL RV IMPEDANCE VALUE: 380 Ohm
MDC IDC MSMT LEADCHNL RV PACING THRESHOLD AMPLITUDE: 0.875 V
MDC IDC MSMT LEADCHNL RV PACING THRESHOLD PULSEWIDTH: 0.4 ms
MDC IDC PG IMPLANT DT: 20170206
MDC IDC STAT BRADY AP VP PERCENT: 0.02 %
MDC IDC STAT BRADY AS VS PERCENT: 61.18 %

## 2017-11-15 ENCOUNTER — Emergency Department
Admission: EM | Admit: 2017-11-15 | Discharge: 2017-11-15 | Disposition: A | Discharge status: Home / Self Care | Payer: PPO | Attending: Emergency Medicine | Admitting: Emergency Medicine

## 2017-11-15 ENCOUNTER — Encounter: Payer: Self-pay | Admitting: Emergency Medicine

## 2017-11-15 ENCOUNTER — Emergency Department: Payer: PPO

## 2017-11-15 DIAGNOSIS — Z7984 Long term (current) use of oral hypoglycemic drugs: Secondary | ICD-10-CM | POA: Insufficient documentation

## 2017-11-15 DIAGNOSIS — E119 Type 2 diabetes mellitus without complications: Secondary | ICD-10-CM | POA: Insufficient documentation

## 2017-11-15 DIAGNOSIS — I2510 Atherosclerotic heart disease of native coronary artery without angina pectoris: Secondary | ICD-10-CM | POA: Insufficient documentation

## 2017-11-15 DIAGNOSIS — R1031 Right lower quadrant pain: Secondary | ICD-10-CM | POA: Insufficient documentation

## 2017-11-15 DIAGNOSIS — K5730 Diverticulosis of large intestine without perforation or abscess without bleeding: Secondary | ICD-10-CM | POA: Insufficient documentation

## 2017-11-15 DIAGNOSIS — Z87891 Personal history of nicotine dependence: Secondary | ICD-10-CM | POA: Diagnosis not present

## 2017-11-15 DIAGNOSIS — Z79899 Other long term (current) drug therapy: Secondary | ICD-10-CM | POA: Diagnosis not present

## 2017-11-15 DIAGNOSIS — I700 Atherosclerosis of aorta: Secondary | ICD-10-CM | POA: Insufficient documentation

## 2017-11-15 DIAGNOSIS — K5732 Diverticulitis of large intestine without perforation or abscess without bleeding: Secondary | ICD-10-CM | POA: Diagnosis not present

## 2017-11-15 DIAGNOSIS — Z7901 Long term (current) use of anticoagulants: Secondary | ICD-10-CM | POA: Insufficient documentation

## 2017-11-15 DIAGNOSIS — R911 Solitary pulmonary nodule: Secondary | ICD-10-CM | POA: Diagnosis not present

## 2017-11-15 DIAGNOSIS — Z950 Presence of cardiac pacemaker: Secondary | ICD-10-CM | POA: Insufficient documentation

## 2017-11-15 DIAGNOSIS — I10 Essential (primary) hypertension: Secondary | ICD-10-CM | POA: Insufficient documentation

## 2017-11-15 DIAGNOSIS — R1030 Lower abdominal pain, unspecified: Secondary | ICD-10-CM

## 2017-11-15 LAB — COMPREHENSIVE METABOLIC PANEL
ALBUMIN: 4.2 g/dL (ref 3.5–5.0)
ALT: 20 U/L (ref 17–63)
ANION GAP: 5 (ref 5–15)
AST: 19 U/L (ref 15–41)
Alkaline Phosphatase: 85 U/L (ref 38–126)
BUN: 17 mg/dL (ref 6–20)
CO2: 27 mmol/L (ref 22–32)
Calcium: 10.3 mg/dL (ref 8.9–10.3)
Chloride: 102 mmol/L (ref 101–111)
Creatinine, Ser: 0.75 mg/dL (ref 0.61–1.24)
GFR calc non Af Amer: >60 mL/min (ref >60)
GLUCOSE: 176 mg/dL — AB (ref 65–99)
POTASSIUM: 4.8 mmol/L (ref 3.5–5.1)
SODIUM: 134 mmol/L — AB (ref 135–145)
Total Bilirubin: 0.7 mg/dL (ref 0.3–1.2)
Total Protein: 7.1 g/dL (ref 6.5–8.1)

## 2017-11-15 LAB — CBC
HEMATOCRIT: 42.7 % (ref 40.0–52.0)
HEMOGLOBIN: 15 g/dL (ref 13.0–18.0)
MCH: 31.7 pg (ref 26.0–34.0)
MCHC: 35.2 g/dL (ref 32.0–36.0)
MCV: 90.1 fL (ref 80.0–100.0)
Platelets: 164 10*3/uL (ref 150–440)
RBC: 4.74 MIL/uL (ref 4.40–5.90)
RDW: 13.3 % (ref 11.5–14.5)
WBC: 8.3 10*3/uL (ref 3.8–10.6)

## 2017-11-15 LAB — URINALYSIS, COMPLETE (UACMP) WITH MICROSCOPIC
BACTERIA UA: NONE SEEN
BILIRUBIN URINE: NEGATIVE
Glucose, UA: >=500 mg/dL — AB
Hgb urine dipstick: NEGATIVE
Ketones, ur: NEGATIVE mg/dL
Leukocytes, UA: NEGATIVE
Nitrite: NEGATIVE
PH: 5 (ref 5.0–8.0)
Protein, ur: NEGATIVE mg/dL
Specific Gravity, Urine: 1.02 (ref 1.005–1.030)
Squamous Epithelial / LPF: NONE SEEN (ref 0–5)

## 2017-11-15 LAB — LIPASE, BLOOD: Lipase: 23 U/L (ref 11–51)

## 2017-11-15 MED ORDER — IOPAMIDOL (ISOVUE-370) INJECTION 76%
75.0000 mL | Freq: Once | INTRAVENOUS | Status: AC | PRN
  Administered 2017-11-15: 75 mL via INTRAVENOUS
  Filled 2017-11-15: qty 75

## 2017-11-15 MED ORDER — SODIUM CHLORIDE 0.9 % IV BOLUS
500.0000 mL | Freq: Once | INTRAVENOUS | Status: AC
  Administered 2017-11-15: 500 mL via INTRAVENOUS

## 2017-11-15 NOTE — Discharge Instructions (Signed)
Your CT scan today does not show any acute concerns.  There are multiple findings, as often occurs with this detailed imaging. Please follow up with your doctor for further evaluation of these findings.    CT report summary: IMPRESSION:  1.  No evidence for acute  abnormality.  2. Colonic diverticulosis.  Significant stool burden.  3. Normal appendix.  4. Aortic atherosclerosis. (ICD10-I70.0) 4 millimeter RIGHT LOWER  lobe pulmonary nodule. No follow-up needed if patient is low-risk.  Non-contrast chest CT can be considered in 12 months if patient is  high-risk. This recommendation follows the consensus statement:  Guidelines for Management of Incidental Pulmonary Nodules Detected  on CT Images: From the Fleischner Society 2017; Radiology 2017;  284:228-243.  5. Coronary artery disease.  6. Prostatic enlargement.

## 2017-11-15 NOTE — ED Triage Notes (Addendum)
Pt comes into the ED via POV c/o RLQ of the abdomen.  Patient states the pain started this morning.  Patient denies any N/V/D.  Patient in NAD at this time with even and unlabored respirations.  Denies any difficulty urinating at this time. Patient tender on palpation with sharp shooting pains.

## 2017-11-15 NOTE — ED Provider Notes (Signed)
[facility] Emergency Department Provider Note  ____________________________________________  Time seen: Approximately 5:18 PM  I have reviewed the triage vital signs and the nursing notes.   HISTORY  Chief Complaint Abdominal Pain    HPI Ernest [name] is a 82 y.o. male with a history of diabetes, hypertension, paroxysmal atrial fibrillation who complains of right lower quadrant abdominal pain that started this morning at about 8:00 AM. Diffuse lower abdominal pain, described as "it hurt", nonradiating, no aggravating or alleviating factors, intermittent lasting a few minutes at a time, severe when present. Never had anything like this before. No vomiting or diarrhea or constipation. No fevers or chills. No recent trauma. Patient reports that while waiting to be seen, the pain has resolved.   no dysuria frequency or hematuria. No black or bloody stool   Past Medical History:  Diagnosis Date  . Aortic atherosclerosis ([place])   . Arthritis   . BPH (benign prostatic hypertrophy)   . Coronary artery disease    a. Cath 12/2013->[facility]->Med rx.  . Deviated nasal septum   . Diabetes mellitus without complication ([place])    type 2  . Essential hypertension   . GERD (gastroesophageal reflux disease)   . Hearing deficit    wears hearing aids  . Hyperlipidemia   . 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 PAF (paroxysmal atrial fibrillation) ([place])    a. CHA2DS2VASc = 7-->eliquis;  b. 03/2015 Echo: EF 55-60%, Gr1 DD, mild MR, mildly dil LA, PASP 58mmHg.  Marland Kitchen Pneumonia    as a child  . Presence of permanent cardiac pacemaker 08/16/2015  . PVD (peripheral vascular disease) ([place])   . Rosacea   . Stroke ([place])   . Thyroid disease   . Urine frequency      Patient Active Problem List   Diagnosis Date Noted  . Sinus node dysfunction ([place]) 08/16/2015  . Bradycardia 08/16/2015  . PAF (paroxysmal atrial fibrillation) ([place])   . Coronary artery disease   . Essential  hypertension   . Hypertension 03/11/2015  . Type 2 diabetes mellitus ([place]) 03/11/2015  . GERD (gastroesophageal reflux disease) 03/11/2015  . Hyperlipidemia 03/11/2015  . BPH (benign prostatic hyperplasia) 03/11/2015  . Atrial fibrillation with rapid ventricular response ([place]) 03/11/2015  . Atrial fibrillation with RVR ([place]) 03/10/2015  . Lumbar stenosis with neurogenic claudication 01/04/2015     Past Surgical History:  Procedure Laterality Date  . CARDIAC CATHETERIZATION     [facility] 12/24/13: 30% LM , pLAD, ostial CX. 20% mLAD, mRCA. 70% OM2 (small vessel). Med RX.   Marland Kitchen CARDIAC CATHETERIZATION     [facility]  . [facility]  . CATARACT EXTRACTION W/ INTRAOCULAR LENS IMPLANT     right  . COLONOSCOPY    . EP IMPLANTABLE DEVICE N/A 08/16/2015   Procedure: Pacemaker Implant;  Surgeon: Deboraha Sprang, MD;  Location: [place] CV LAB;  Service: Cardiovascular;  Laterality: N/A;  . LUMBAR LAMINECTOMY/DECOMPRESSION MICRODISCECTOMY N/A 01/04/2015   Procedure: LUMBAR LAMINECTOMY/DECOMPRESSION MICRODISCECTOMY 3 LEVELS;  Surgeon: Newman Pies, MD;  Location: [place] NEURO ORS;  Service: Neurosurgery;  Laterality: N/A;  L23 L34 L45 laminectomies and foraminotomies  . MULTIPLE TOOTH EXTRACTIONS    . PROSTATE BIOPSY       Prior to Admission medications   Medication Sig Start Date End Date Taking? Authorizing Provider  amiodarone (PACERONE) 200 MG tablet Take 1/2 tablet (100 mg) by mouth once daily 01/02/17   Deboraha Sprang,  MD  apixaban (ELIQUIS) 5 MG TABS tablet Take 1 tablet (5 mg total) by mouth 2 (two) times daily. 08/20/17   Deboraha Sprang, MD  doxycycline (VIBRAMYCIN) 100 MG capsule Take 100 mg by mouth 2 (two) times daily.     [provider]  levothyroxine (SYNTHROID, LEVOTHROID) 112 MCG tablet Take 112 mcg by mouth daily before breakfast.    [provider]  lovastatin (MEVACOR) 40 MG tablet Take 40 mg by mouth at bedtime.     [provider]   metFORMIN (GLUCOPHAGE) 500 MG tablet Take 500 mg by mouth 2 (two) times daily with a meal.     [provider]  metronidazole (NORITATE) 1 % cream Apply 1 application topically daily.    [provider]  Multiple Vitamin (MULTIVITAMIN) tablet Take 1 tablet by mouth daily.    [provider]  tamsulosin (FLOMAX) 0.4 MG CAPS capsule Take 0.4 mg by mouth 2 (two) times daily.    [provider]     Allergies Patient has no known allergies.   Family History  Problem Relation Age of Onset  . Heart attack Mother   . Heart attack Father   . Cancer - Lung Sister   . Cancer - Lung Brother   . Cancer - Lung Brother   . Cancer Brother   . Diabetes Sister     Social History Social History   Tobacco Use  . Smoking status: Former Smoker    Types: Cigarettes  . Smokeless tobacco: Never Used  . Tobacco comment: " Quit smoking cigarettes in 1983 "  Substance Use Topics  . Alcohol use: No  . Drug use: No    Review of Systems  Constitutional:   No fever or chills.  ENT:   No sore throat. No rhinorrhea. Cardiovascular:   No chest pain or syncope. Respiratory:   No dyspnea or cough. Gastrointestinal:  positive as above for lower abdominal pain without vomiting and diarrhea.  Musculoskeletal:   Negative for focal pain or swelling All other systems reviewed and are negative except as documented above in ROS and HPI.  ____________________________________________   PHYSICAL EXAM:  VITAL SIGNS: ED Triage Vitals  Enc Vitals Group     BP 11/15/17 1412 (!) 142/68     Pulse Rate 11/15/17 1412 69     Resp 11/15/17 1412 18     Temp 11/15/17 1412 97.9 F (36.6 C)     Temp Source 11/15/17 1412 Oral     SpO2 11/15/17 1412 99 %     Weight 11/15/17 1418 170 lb (77.1 kg)     Height 11/15/17 1418 5\' 7"  (1.702 m)     Head Circumference --      Peak Flow --      Pain Score 11/15/17 1418 6     Pain Loc --      Pain Edu? --      Excl. in [place]? --      Vital signs reviewed, nursing assessments reviewed.   Constitutional:   Alert and oriented. Well appearing and in no distress. Eyes:   Conjunctivae are normal. EOMI. PERRL. ENT      Head:   Normocephalic and atraumatic.      Nose:   No congestion/rhinnorhea.       Mouth/Throat:   MMM, no pharyngeal erythema. No peritonsillar mass.       Neck:   No meningismus. Full ROM. Hematological/Lymphatic/Immunilogical:   No cervical lymphadenopathy. Cardiovascular:   RRR.  Symmetric bilateral radial and DP pulses.  No murmurs.  Respiratory:   Normal respiratory effort without tachypnea/retractions. Breath sounds are clear and equal bilaterally. No wheezes/rales/rhonchi. Gastrointestinal:   Soft with focal right lower quadrant tenderness. Non distended. There is no CVA tenderness.  No rebound, rigidity, or guarding. Genitourinary:   deferred Musculoskeletal:   Normal range of motion in all extremities. No joint effusions.  No lower extremity tenderness.  No edema.and no midline spinal tenderness Neurologic:   Normal speech and language.  Motor grossly intact. No acute focal neurologic deficits are appreciated.  Skin:    Skin is warm, dry and intact. No rash noted.  No petechiae, purpura, or bullae.  ____________________________________________    LABS (pertinent positives/negatives) (all labs ordered are listed, but only abnormal results are displayed) Labs Reviewed  COMPREHENSIVE METABOLIC PANEL - Abnormal; Notable for the following components:      Result Value   Sodium 134 (*)    Glucose, Bld 176 (*)    All other components within normal limits  URINALYSIS, COMPLETE (UACMP) WITH MICROSCOPIC - Abnormal; Notable for the following components:   Color, Urine YELLOW (*)    APPearance CLEAR (*)    Glucose, UA >=500 (*)    All other components within normal limits  LIPASE, BLOOD  CBC    ____________________________________________   EKG    ____________________________________________    RADIOLOGY  Ct Abdomen Pelvis W Contrast  Result Date: 11/15/2017 CLINICAL DATA:  Pt comes into the ED via POV c/o RLQ of the abdomen. Patient states the pain started this morning. Patient denies any N/V/D. Patient in NAD at this time with even and unlabored respirations. EXAM: CT ABDOMEN AND PELVIS WITH CONTRAST TECHNIQUE: Multidetector CT imaging of the abdomen and pelvis was performed using the standard protocol following bolus administration of intravenous contrast. CONTRAST:  31mL ISOVUE-370 IOPAMIDOL (ISOVUE-370) INJECTION 76% COMPARISON:  Chest x-ray 08/17/2015 FINDINGS: Lower chest: Within the posterior RIGHT LOWER lobe there is a 4 millimeter nodule on image 6 of series 4. There are no pleural effusions. There is significant coronary artery calcification. Patient is a transvenous pacemaker with leads to the RIGHT atrium and RIGHT ventricle. No pericardial effusion. Hepatobiliary: No focal liver abnormality is seen. No radiopaque gallstones, biliary dilatation, or pericholecystic inflammatory changes. Pancreas: Unremarkable. No pancreatic ductal dilatation or surrounding inflammatory changes. Spleen: Normal in size without focal abnormality. Adrenals/Urinary Tract: There is a subcapsular cyst in the midpole region of the LEFT kidney. No hydronephrosis or suspicious renal mass. The adrenal glands are normal. Urinary bladder is unremarkable in appearance. Stomach/Bowel: Stomach and small bowel loops are normal in appearance. The appendix is well seen and has a normal appearance. There is significant diverticular disease of the sigmoid colon. Significant stool burden. No associated inflammation. No abscess or perforation. Vascular/Lymphatic: There is atherosclerotic calcification of the abdominal aorta not associated with aneurysm. Although involved by atherosclerosis, there is vascular  opacification of the celiac axis, superior mesenteric artery, and inferior mesenteric artery. Normal appearance of the portal venous system and inferior vena cava. No retroperitoneal or mesenteric adenopathy. Reproductive: The prostate is enlarged and partially calcified. Seminal vesicles are normal in appearance. Other: No abdominal wall hernia or abnormality. No abdominopelvic ascites. Musculoskeletal: No acute or significant osseous findings. IMPRESSION: 1.  No evidence for acute  abnormality. 2. Colonic diverticulosis.  Significant stool burden. 3. Normal appendix. 4. Aortic atherosclerosis. (ICD10-I70.0) 4 millimeter RIGHT LOWER lobe pulmonary nodule. No follow-up needed if patient is low-risk. Non-contrast chest CT can  be considered in 12 months if patient is high-risk. This recommendation follows the consensus statement: Guidelines for Management of Incidental Pulmonary Nodules Detected on CT Images: From the Fleischner Society 2017; Radiology 2017; 284:228-243. 5. Coronary artery disease. 6. Prostatic enlargement. Electronically Signed   By: Nolon Nations M.D.   On: 11/15/2017 17:17    ____________________________________________   PROCEDURES Procedures  ____________________________________________  DIFFERENTIAL DIAGNOSIS   appendicitis, bowel perforation, bowel obstruction, hernia, diverticulitis  CLINICAL IMPRESSION / ASSESSMENT AND PLAN / ED COURSE  Pertinent labs & imaging results that were available during my care of the patient were reviewed by me and considered in my medical decision making (see chart for details).    is well-appearing no acute distress, normal vital signs, normal labs. Presents with lower abdominal pain, found to have some residual right lower quadrant tenderness even though his pain is resolved. Could be due to intestinal gas, but with his age, comorbidities, I recommended a CT scan and the patient agrees. Gentle hydration for renal protection with IV contrast  and with somewhat concentrated urine indicative of mild hydration.  Clinical Course as of Nov 16 1722  Thu Nov 15, 2017  1717 CT images reveal normal air filled appendix. There is diverticulosis without inflammatory changes, free air, or abscess. Will f/u radiology report. Since pain is substantially improved, labs and vitals are normal, will plan to DC to outpatient follow up.   [PS]  6759 CT report confirms my initial findings: IMPRESSION: 1. No evidence for acute abnormality. 2. Colonic diverticulosis. Significant stool burden. 3. Normal appendix. 4. Aortic atherosclerosis. (ICD10-I70.0) 4 millimeter RIGHT LOWER lobe pulmonary nodule. No follow-up needed if patient is low-risk. Non-contrast chest CT can be considered in 12 months if patient is high-risk. This recommendation follows the consensus statement: Guidelines for Management of Incidental Pulmonary Nodules Detected on CT Images: From the Fleischner Society 2017; Radiology 2017; 284:228-243. 5. Coronary artery disease. 6. Prostatic enlargement.     [PS]    Clinical Course User Index [PS] Carrie Mew, MD     ____________________________________________   FINAL CLINICAL IMPRESSION(S) / ED DIAGNOSES    Final diagnoses:  Lower abdominal pain  Aortic atherosclerosis ([place])  Pulmonary nodule  Diverticulosis large intestine w/o perforation or abscess w/o bleeding     ED Discharge Orders    None      Portions of this note were generated with dragon dictation software. Dictation errors may occur despite best attempts at proofreading.    Carrie Mew, MD 11/15/17 [phone]

## 2017-11-15 NOTE — ED Triage Notes (Signed)
FIRST NURSE NOTE-here for right side abdominal pain. NAD. Alert. Hard of hearing.

## 2017-11-19 DIAGNOSIS — R1031 Right lower quadrant pain: Secondary | ICD-10-CM | POA: Diagnosis not present

## 2017-11-19 DIAGNOSIS — I700 Atherosclerosis of aorta: Secondary | ICD-10-CM | POA: Diagnosis not present

## 2017-11-19 DIAGNOSIS — R911 Solitary pulmonary nodule: Secondary | ICD-10-CM | POA: Diagnosis not present

## 2017-12-25 DIAGNOSIS — L718 Other rosacea: Secondary | ICD-10-CM | POA: Diagnosis not present

## 2017-12-25 DIAGNOSIS — Z85828 Personal history of other malignant neoplasm of skin: Secondary | ICD-10-CM | POA: Diagnosis not present

## 2017-12-25 DIAGNOSIS — Z08 Encounter for follow-up examination after completed treatment for malignant neoplasm: Secondary | ICD-10-CM | POA: Diagnosis not present

## 2017-12-25 DIAGNOSIS — L570 Actinic keratosis: Secondary | ICD-10-CM | POA: Diagnosis not present

## 2017-12-25 DIAGNOSIS — X32XXXA Exposure to sunlight, initial encounter: Secondary | ICD-10-CM | POA: Diagnosis not present

## 2018-01-09 ENCOUNTER — Ambulatory Visit (INDEPENDENT_AMBULATORY_CARE_PROVIDER_SITE_OTHER): Payer: PPO | Admitting: *Deleted

## 2018-01-09 ENCOUNTER — Telehealth: Payer: Self-pay | Admitting: Cardiology

## 2018-01-09 DIAGNOSIS — R001 Bradycardia, unspecified: Secondary | ICD-10-CM

## 2018-01-09 DIAGNOSIS — E119 Type 2 diabetes mellitus without complications: Secondary | ICD-10-CM | POA: Diagnosis not present

## 2018-01-09 DIAGNOSIS — E7800 Pure hypercholesterolemia, unspecified: Secondary | ICD-10-CM | POA: Diagnosis not present

## 2018-01-09 DIAGNOSIS — Z79899 Other long term (current) drug therapy: Secondary | ICD-10-CM | POA: Diagnosis not present

## 2018-01-09 NOTE — Telephone Encounter (Signed)
Spoke with pt and reminded pt of remote transmission that is due today. Pt verbalized understanding.   

## 2018-01-11 ENCOUNTER — Encounter: Payer: Self-pay | Admitting: Cardiology

## 2018-01-11 NOTE — Progress Notes (Signed)
Remote pacemaker transmission.   

## 2018-01-21 DIAGNOSIS — I2510 Atherosclerotic heart disease of native coronary artery without angina pectoris: Secondary | ICD-10-CM | POA: Diagnosis not present

## 2018-01-21 DIAGNOSIS — E039 Hypothyroidism, unspecified: Secondary | ICD-10-CM | POA: Diagnosis not present

## 2018-01-21 DIAGNOSIS — Z79899 Other long term (current) drug therapy: Secondary | ICD-10-CM | POA: Diagnosis not present

## 2018-01-21 DIAGNOSIS — E7800 Pure hypercholesterolemia, unspecified: Secondary | ICD-10-CM | POA: Diagnosis not present

## 2018-01-21 DIAGNOSIS — E1165 Type 2 diabetes mellitus with hyperglycemia: Secondary | ICD-10-CM | POA: Diagnosis not present

## 2018-01-21 DIAGNOSIS — I4891 Unspecified atrial fibrillation: Secondary | ICD-10-CM | POA: Diagnosis not present

## 2018-01-21 DIAGNOSIS — I10 Essential (primary) hypertension: Secondary | ICD-10-CM | POA: Diagnosis not present

## 2018-01-30 LAB — CUP PACEART REMOTE DEVICE CHECK
Brady Statistic AP VP Percent: 0.02 %
Brady Statistic AS VP Percent: 0.03 %
Brady Statistic RA Percent Paced: 34.33 %
Date Time Interrogation Session: 20190703201606
Implantable Lead Implant Date: 20170206
Implantable Lead Location: 753860
Implantable Lead Model: 5076
Lead Channel Impedance Value: 399 Ohm
Lead Channel Impedance Value: 437 Ohm
Lead Channel Pacing Threshold Pulse Width: 0.4 ms
Lead Channel Pacing Threshold Pulse Width: 0.4 ms
Lead Channel Sensing Intrinsic Amplitude: 7.5 mV
Lead Channel Setting Pacing Amplitude: 2 V
Lead Channel Setting Pacing Pulse Width: 0.4 ms
MDC IDC LEAD IMPLANT DT: 20170206
MDC IDC LEAD LOCATION: 753859
MDC IDC MSMT BATTERY REMAINING LONGEVITY: 93 mo
MDC IDC MSMT BATTERY VOLTAGE: 3.02 V
MDC IDC MSMT LEADCHNL RA IMPEDANCE VALUE: 380 Ohm
MDC IDC MSMT LEADCHNL RA IMPEDANCE VALUE: 475 Ohm
MDC IDC MSMT LEADCHNL RA PACING THRESHOLD AMPLITUDE: 0.5 V
MDC IDC MSMT LEADCHNL RA SENSING INTR AMPL: 1.625 mV
MDC IDC MSMT LEADCHNL RA SENSING INTR AMPL: 1.625 mV
MDC IDC MSMT LEADCHNL RV PACING THRESHOLD AMPLITUDE: 0.875 V
MDC IDC MSMT LEADCHNL RV SENSING INTR AMPL: 7.5 mV
MDC IDC PG IMPLANT DT: 20170206
MDC IDC SET LEADCHNL RV PACING AMPLITUDE: 2.5 V
MDC IDC SET LEADCHNL RV SENSING SENSITIVITY: 2.8 mV
MDC IDC STAT BRADY AP VS PERCENT: 34.31 %
MDC IDC STAT BRADY AS VS PERCENT: 65.65 %
MDC IDC STAT BRADY RV PERCENT PACED: 0.04 %

## 2018-03-22 ENCOUNTER — Other Ambulatory Visit: Payer: Self-pay | Admitting: Pharmacist

## 2018-03-22 NOTE — Patient Outreach (Signed)
[facility]) Care Management  03/22/2018  Ernest [name] 1935-07-17 [id number]  Subjective  Incoming call from Mrs. Thomley, patient's wife, on behalf of Ernest [name] in response to the [facility] Medication Adherence Campaign. HIPAA identifiers verified. Mrs. Nevins is listed in Epic under patient's [employer].  Mrs. Chittum reports that the patient takes metformin ER 500 mg 2 tablets daily as directed by his PCP. Mrs. Sasso reports that the patient's morning fasting blood sugar has been ranging from 180-200 mg/dL. Reports that she plans to speak with the patient's PCP about this at his next visit. Mrs. Eanes declines to speak further.   Objective  A1C 8.4% (01/21/18 Epic Care Everywhere note) eGFR 93 mL/min (01/09/18 - [place])   Assessment/Plan  1) Patient's diabetes currently not controlled. Will call patient's PCP to recommend tapering up the patient's metformin ER 500 mg dose from 1000 mg daily to metformin ER 1000 mg each morning and 500 mg each evening for 1 week and then metformin ER 1000 mg each morning and each evening.  2) Will close pharmacy episode after reaching out to patient's PCP with this recommendation.  Harlow Asa, PharmD, [facility] [phone]

## 2018-03-22 NOTE — Patient Outreach (Addendum)
[facility]) Care Management  03/22/2018  Ernest [name] 1935-11-09 [id number]   Call patient's PCP office to recommend tapering up the patient's metformin ER 500 mg dose from 1000 mg daily to metformin ER 1000 mg each morning and 500 mg each evening for 1 week and then metformin ER 1000 mg each morning and each evening. Leave a message with Misty in the office with this recommendation for the provider.  Harlow Asa, PharmD, [facility] [phone]

## 2018-03-26 ENCOUNTER — Other Ambulatory Visit: Payer: Self-pay | Admitting: Internal Medicine

## 2018-03-26 NOTE — Telephone Encounter (Signed)
Please review for refill. Thanks!  

## 2018-03-29 ENCOUNTER — Other Ambulatory Visit: Payer: Self-pay

## 2018-03-29 ENCOUNTER — Telehealth: Payer: Self-pay | Admitting: Internal Medicine

## 2018-03-29 MED ORDER — AMIODARONE HCL 200 MG PO TABS: ORAL_TABLET | ORAL | 0 refills | Status: DC

## 2018-03-29 NOTE — Telephone Encounter (Signed)
[facility] pharmacy calling  Pt c/o medication issue:  1. Name of Medication: Amiodarone   2. How are you currently taking this medication (dosage and times per day)? 200 mg he is to cut it in half daily   3. Are you having a reaction (difficulty breathing--STAT)?   4. What is your medication issue? Needs to know the correct dose before we asked him to take the half pill he was taking a full pill Would like to make sure  Please call back

## 2018-03-29 NOTE — Telephone Encounter (Signed)
Spoke with pt wife to clarify the directions for his Amiodarone, per last office visit with Dr. Caryl Comes he is taking one whole tablet 5 days a week and skipping two days and repeating. Refill Sent to [employer] order.

## 2018-04-11 ENCOUNTER — Ambulatory Visit (INDEPENDENT_AMBULATORY_CARE_PROVIDER_SITE_OTHER): Payer: PPO | Admitting: *Deleted

## 2018-04-11 ENCOUNTER — Telehealth: Payer: Self-pay | Admitting: Cardiology

## 2018-04-11 DIAGNOSIS — I495 Sick sinus syndrome: Secondary | ICD-10-CM

## 2018-04-11 DIAGNOSIS — R001 Bradycardia, unspecified: Secondary | ICD-10-CM

## 2018-04-11 NOTE — Telephone Encounter (Signed)
Confirmed remote transmission w/ pt daughter.   

## 2018-04-12 NOTE — Progress Notes (Signed)
Remote pacemaker transmission.   

## 2018-04-16 ENCOUNTER — Encounter: Payer: Self-pay | Admitting: Internal Medicine

## 2018-04-16 ENCOUNTER — Ambulatory Visit (INDEPENDENT_AMBULATORY_CARE_PROVIDER_SITE_OTHER): Payer: PPO | Admitting: Internal Medicine

## 2018-04-16 VITALS — BP 113/68 | HR 62 | Ht 67.0 in | Wt 170.0 lb

## 2018-04-16 DIAGNOSIS — Z950 Presence of cardiac pacemaker: Secondary | ICD-10-CM

## 2018-04-16 DIAGNOSIS — I480 Paroxysmal atrial fibrillation: Secondary | ICD-10-CM

## 2018-04-16 DIAGNOSIS — Z79899 Other long term (current) drug therapy: Secondary | ICD-10-CM

## 2018-04-16 DIAGNOSIS — R001 Bradycardia, unspecified: Secondary | ICD-10-CM

## 2018-04-16 LAB — CUP PACEART INCLINIC DEVICE CHECK
Battery Remaining Longevity: 94 mo
Battery Voltage: 3.01 V
Brady Statistic AP VP Percent: 0.02 %
Brady Statistic AS VS Percent: 62.93 %
Brady Statistic RA Percent Paced: 37.04 %
Date Time Interrogation Session: 20191008150357
Implantable Lead Implant Date: 20170206
Implantable Lead Location: 753860
Implantable Lead Model: 5076
Implantable Pulse Generator Implant Date: 20170206
Lead Channel Impedance Value: 380 Ohm
Lead Channel Impedance Value: 437 Ohm
Lead Channel Pacing Threshold Amplitude: 0.5 V
Lead Channel Pacing Threshold Pulse Width: 0.4 ms
Lead Channel Sensing Intrinsic Amplitude: 2.5 mV
Lead Channel Setting Sensing Sensitivity: 2.8 mV
MDC IDC LEAD IMPLANT DT: 20170206
MDC IDC LEAD LOCATION: 753859
MDC IDC MSMT LEADCHNL RA IMPEDANCE VALUE: 399 Ohm
MDC IDC MSMT LEADCHNL RV IMPEDANCE VALUE: 399 Ohm
MDC IDC MSMT LEADCHNL RV PACING THRESHOLD AMPLITUDE: 0.75 V
MDC IDC MSMT LEADCHNL RV PACING THRESHOLD PULSEWIDTH: 0.4 ms
MDC IDC MSMT LEADCHNL RV SENSING INTR AMPL: 11.75 mV
MDC IDC SET LEADCHNL RA PACING AMPLITUDE: 2 V
MDC IDC SET LEADCHNL RV PACING AMPLITUDE: 2.5 V
MDC IDC SET LEADCHNL RV PACING PULSEWIDTH: 0.4 ms
MDC IDC STAT BRADY AP VS PERCENT: 37.02 %
MDC IDC STAT BRADY AS VP PERCENT: 0.03 %
MDC IDC STAT BRADY RV PERCENT PACED: 0.04 %

## 2018-04-16 NOTE — Patient Instructions (Signed)
Medication Instructions:  - Your physician recommends that you continue on your current medications as directed. Please refer to the Current Medication list given to you today.  If you need a refill on your cardiac medications before your next appointment, please call your pharmacy.   Lab work: - none ordered  If you have labs (blood work) drawn today and your tests are completely normal, you will receive your results only by: Marland Kitchen MyChart Message (if you have MyChart) OR . A paper copy in the mail If you have any lab test that is abnormal or we need to change your treatment, we will call you to review the results.  Testing/Procedures: - none ordered  Follow-Up: At [facility], you and your health needs are our priority.  As part of our continuing mission to provide you with exceptional heart care, we have created designated Provider Care Teams.  These Care Teams include your primary Cardiologist (physician) and Advanced Practice Providers (APPs -  Physician Assistants and Nurse Practitioners) who all work together to provide you with the care you need, when you need it.  Remote monitoring is used to monitor your Pacemaker of ICD from home. This monitoring reduces the number of office visits required to check your device to one time per year. It allows [place] to keep an eye on the functioning of your device to ensure it is working properly. You are scheduled for a device check from home on 07/11/18. You may send your transmission at any time that day. If you have a wireless device, the transmission will be sent automatically. After your physician reviews your transmission, you will receive a postcard with your next transmission date.  . Your physician wants you to follow-up in: 6 months with Dr. Caryl Comes. You will receive a reminder letter in the mail/ call two months in advance. If you don't receive a letter/ call, please call our office to schedule the follow-up appointment.   Any Other Special  Instructions Will Be Listed Below (If Applicable).

## 2018-04-16 NOTE — Progress Notes (Signed)
Electrophysiology Office Note   Date:  04/16/2018   ID:  Ernest [name], DOB September 14, 1935, MRN [id number]  PCP:  Derinda Late, MD  Cardiologist:  [facility] Primary Electrophysiologist:   Virl Axe, MD       History of Present Illness: Ernest [name] is a 82 y.o. male seen in electrophysiology followup  for  atrial fibrillation-paroxysmal with rapid ventricular response associated with sinus bradycardia. He is status post pacemaker 2/17 Medtronic.  He is managed with apixaban and amiodarone.  DATE TEST EF   2015 Cath  70% Marginal  9/16 echo  55-60 mild LAE                Date TSH LFTs Cr K Hgb  9/17  3.98 19      12/17  4.12 21      12/18 5.016 15 0.8 4.3 14.3  7/19 4.462 14 0.8 4.5 14.5     The patient denies chest pain, shortness of breath, nocturnal dyspnea, orthopnea or peripheral edema.  There have been no palpitations, lightheadedness or syncope.    Wife is mostly concerned about his memory   Past Medical History:  Diagnosis Date  . Aortic atherosclerosis ([place])   . Arthritis   . BPH (benign prostatic hypertrophy)   . Coronary artery disease    a. Cath 12/2013->[facility]->Med rx.  . Deviated nasal septum   . Diabetes mellitus without complication ([place])    type 2  . Essential hypertension   . GERD (gastroesophageal reflux disease)   . Hearing deficit    wears hearing aids  . Hyperlipidemia   . 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 PAF (paroxysmal atrial fibrillation) ([place])    a. CHA2DS2VASc = 7-->eliquis;  b. 03/2015 Echo: EF 55-60%, Gr1 DD, mild MR, mildly dil LA, PASP 32mmHg.  Marland Kitchen Pneumonia    as a child  . Presence of permanent cardiac pacemaker 08/16/2015  . PVD (peripheral vascular disease) ([place])   . Rosacea   . Stroke ([place])   . Thyroid disease   . Urine frequency    Past Surgical History:  Procedure Laterality Date  . CARDIAC CATHETERIZATION     [facility] 12/24/13: 30% LM , pLAD, ostial CX. 20% mLAD, mRCA. 70% OM2 (small vessel). Med RX.   Marland Kitchen  CARDIAC CATHETERIZATION     [facility]  . CARD[facility]TERIZATION     [facility]  . CATARACT EXTRACTION W/ INTRAOCULAR LENS IMPLANT     right  . COLONOSCOPY    . EP IMPLANTABLE DEVICE N/A 08/16/2015   Procedure: Pacemaker Implant;  Surgeon: Deboraha Sprang, MD;  Location: [place] CV LAB;  Service: Cardiovascular;  Laterality: N/A;  . LUMBAR LAMINECTOMY/DECOMPRESSION MICRODISCECTOMY N/A 01/04/2015   Procedure: LUMBAR LAMINECTOMY/DECOMPRESSION MICRODISCECTOMY 3 LEVELS;  Surgeon: Newman Pies, MD;  Location: [place] NEURO ORS;  Service: Neurosurgery;  Laterality: N/A;  L23 L34 L45 laminectomies and foraminotomies  . MULTIPLE TOOTH EXTRACTIONS    . PROSTATE BIOPSY       Current Outpatient Medications  Medication Sig Dispense Refill  . amiodarone (PACERONE) 200 MG tablet Please take Amiodarone HCL 200 mg PO Tabs 5 days a week and skip 2 days and repeat. 60 tablet 0  . apixaban (ELIQUIS) 5 MG TABS tablet Take 1 tablet (5 mg total) by mouth 2 (two) times daily. 180 tablet 2  . doxycycline (VIBRAMYCIN) 100 MG capsule Take 100 mg by mouth 2 (two) times daily.     Marland Kitchen levothyroxine (SYNTHROID, LEVOTHROID)  112 MCG tablet Take 112 mcg by mouth daily before breakfast.    . lovastatin (MEVACOR) 40 MG tablet Take 40 mg by mouth at bedtime.     . memantine (NAMENDA) 5 MG tablet Take 5 mg by mouth 2 (two) times daily.     . metFORMIN (GLUCOPHAGE-XR) 500 MG 24 hr tablet Take 2 tablets by mouth daily.    . metronidazole (NORITATE) 1 % cream Apply 1 application topically daily.    . Multiple Vitamin (MULTIVITAMIN) tablet Take 1 tablet by mouth daily.    . tamsulosin (FLOMAX) 0.4 MG CAPS capsule Take 0.4 mg by mouth 2 (two) times daily.     No current facility-administered medications for this visit.     Allergies:   Patient has no known allergies.   Social History:  The patient  reports that he has quit smoking. His smoking use included cigarettes. He has never used smokeless tobacco. He reports that he does not drink  alcohol or use drugs.   Family History:  The patient's family history includes Cancer in his brother; Cancer - Lung in his brother, brother, and sister; Diabetes in his sister; Heart attack in his father and mother.    ROS:  Please see the history of present illness.   Otherwise, review of systems is negative .    PHYSICAL EXAM: VS:  BP 113/68 (BP Location: Left Arm, Patient Position: Sitting, Cuff Size: Normal)   Pulse 62   Ht 5\' 7"  (1.702 m)   Wt 170 lb (77.1 kg)   BMI 26.63 kg/m  , BMI Body mass index is 26.63 kg/m. Well developed and nourished in no acute distress HENT normal Neck supple with JVP-flat Clear Regular rate and rhythm, no murmurs or gallops Abd-soft with active BS No Clubbing cyanosis edema Skin-warm and dry A & Oriented  Grossly normal sensory and motor function\   EKG: Sinus at 62 16/09/42     ASSESSMENT AND PLAN:  Atrial fibrillation-paroxysmal  Coronary artery disease- nonobstructive  Pacemaker-Medtronic  Low blood pressure  Prior stroke  Sinus bradycardia  Amiodarone therapy  Dementia  No intercurrent atrial fibrillation or flutter  On Anticoagulation;  No bleeding issues   Without symptoms of ischemia  Tolerating amio with normal surveillance labs   Signed, Virl Axe, MD  04/16/2018 11:17 AM     [facility] [address] [place] [address] [phone] (office) [phone] (fax)

## 2018-04-20 LAB — CUP PACEART REMOTE DEVICE CHECK
Battery Remaining Longevity: 94 mo
Brady Statistic AP VP Percent: 0.02 %
Brady Statistic AP VS Percent: 37.55 %
Brady Statistic AS VP Percent: 0.02 %
Brady Statistic AS VS Percent: 62.41 %
Implantable Lead Implant Date: 20170206
Implantable Lead Model: 5076
Lead Channel Impedance Value: 380 Ohm
Lead Channel Impedance Value: 380 Ohm
Lead Channel Impedance Value: 418 Ohm
Lead Channel Pacing Threshold Amplitude: 0.875 V
Lead Channel Pacing Threshold Pulse Width: 0.4 ms
Lead Channel Sensing Intrinsic Amplitude: 1.875 mV
Lead Channel Sensing Intrinsic Amplitude: 1.875 mV
Lead Channel Sensing Intrinsic Amplitude: 7.625 mV
Lead Channel Sensing Intrinsic Amplitude: 7.625 mV
Lead Channel Setting Pacing Amplitude: 2 V
Lead Channel Setting Pacing Pulse Width: 0.4 ms
MDC IDC LEAD IMPLANT DT: 20170206
MDC IDC LEAD LOCATION: 753859
MDC IDC LEAD LOCATION: 753860
MDC IDC MSMT BATTERY VOLTAGE: 3.01 V
MDC IDC MSMT LEADCHNL RA PACING THRESHOLD AMPLITUDE: 0.5 V
MDC IDC MSMT LEADCHNL RA PACING THRESHOLD PULSEWIDTH: 0.4 ms
MDC IDC MSMT LEADCHNL RV IMPEDANCE VALUE: 437 Ohm
MDC IDC PG IMPLANT DT: 20170206
MDC IDC SESS DTM: 20191003165853
MDC IDC SET LEADCHNL RV PACING AMPLITUDE: 2.5 V
MDC IDC SET LEADCHNL RV SENSING SENSITIVITY: 2.8 mV
MDC IDC STAT BRADY RA PERCENT PACED: 37.57 %
MDC IDC STAT BRADY RV PERCENT PACED: 0.04 %

## 2018-05-28 ENCOUNTER — Other Ambulatory Visit: Payer: Self-pay | Admitting: *Deleted

## 2018-05-28 ENCOUNTER — Other Ambulatory Visit: Payer: Self-pay | Admitting: Internal Medicine

## 2018-05-28 MED ORDER — AMIODARONE HCL 200 MG PO TABS: ORAL_TABLET | ORAL | 2 refills | Status: DC

## 2018-05-28 NOTE — Telephone Encounter (Signed)
Requested Prescriptions   Signed Prescriptions Disp Refills  . amiodarone (PACERONE) 200 MG tablet 60 tablet 2    Sig: Please take Amiodarone HCL 200 mg PO Tabs 5 days a week and skip 2 days and repeat.    Authorizing Provider: Deboraha Sprang    Ordering User: Britt Bottom   Refill Request for Eliquis.

## 2018-05-28 NOTE — Telephone Encounter (Signed)
Requested Prescriptions   Signed Prescriptions Disp Refills  . amiodarone (PACERONE) 200 MG tablet 60 tablet 2    Sig: Please take Amiodarone HCL 200 mg PO Tabs 5 days a week and skip 2 days and repeat.    Authorizing Provider: Deboraha Sprang    Ordering User: Britt Bottom

## 2018-05-28 NOTE — Telephone Encounter (Signed)
°*  STAT* If patient is at the pharmacy, call can be transferred to refill team.   1. Which medications need to be refilled? (please list name of each medication and dose if known)  Amiodarone  Eliquis   2. Which pharmacy/location (including street and city if local pharmacy) is medication to be sent to? [facility]  3. Do they need a 30 day or 90 day supply? 90 day

## 2018-05-29 MED ORDER — APIXABAN 5 MG PO TABS: 5.0000 mg | ORAL_TABLET | Freq: Two times a day (BID) | ORAL | 0 refills | Status: DC

## 2018-06-27 DIAGNOSIS — H2513 Age-related nuclear cataract, bilateral: Secondary | ICD-10-CM | POA: Diagnosis not present

## 2018-07-11 ENCOUNTER — Ambulatory Visit (INDEPENDENT_AMBULATORY_CARE_PROVIDER_SITE_OTHER): Payer: PPO

## 2018-07-11 DIAGNOSIS — I495 Sick sinus syndrome: Secondary | ICD-10-CM

## 2018-07-11 LAB — CUP PACEART REMOTE DEVICE CHECK
Battery Remaining Longevity: 87 mo
Brady Statistic AP VP Percent: 0.02 %
Brady Statistic AP VS Percent: 50.66 %
Brady Statistic AS VS Percent: 49.3 %
Brady Statistic RV Percent Paced: 0.04 %
Implantable Lead Implant Date: 20170206
Implantable Lead Implant Date: 20170206
Implantable Lead Location: 753859
Implantable Lead Model: 5076
Lead Channel Impedance Value: 380 Ohm
Lead Channel Impedance Value: 399 Ohm
Lead Channel Impedance Value: 418 Ohm
Lead Channel Impedance Value: 437 Ohm
Lead Channel Pacing Threshold Amplitude: 0.5 V
Lead Channel Pacing Threshold Amplitude: 0.75 V
Lead Channel Pacing Threshold Pulse Width: 0.4 ms
Lead Channel Sensing Intrinsic Amplitude: 2 mV
Lead Channel Sensing Intrinsic Amplitude: 7.875 mV
Lead Channel Setting Pacing Amplitude: 2.5 V
Lead Channel Setting Sensing Sensitivity: 2.8 mV
MDC IDC LEAD LOCATION: 753860
MDC IDC MSMT BATTERY VOLTAGE: 3.01 V
MDC IDC MSMT LEADCHNL RA PACING THRESHOLD PULSEWIDTH: 0.4 ms
MDC IDC MSMT LEADCHNL RA SENSING INTR AMPL: 2 mV
MDC IDC MSMT LEADCHNL RV SENSING INTR AMPL: 7.875 mV
MDC IDC PG IMPLANT DT: 20170206
MDC IDC SESS DTM: 20200102181320
MDC IDC SET LEADCHNL RA PACING AMPLITUDE: 2 V
MDC IDC SET LEADCHNL RV PACING PULSEWIDTH: 0.4 ms
MDC IDC STAT BRADY AS VP PERCENT: 0.02 %
MDC IDC STAT BRADY RA PERCENT PACED: 50.68 %

## 2018-07-12 NOTE — Progress Notes (Signed)
Remote pacemaker transmission.   

## 2018-07-16 DIAGNOSIS — Z79899 Other long term (current) drug therapy: Secondary | ICD-10-CM | POA: Diagnosis not present

## 2018-07-16 DIAGNOSIS — E7800 Pure hypercholesterolemia, unspecified: Secondary | ICD-10-CM | POA: Diagnosis not present

## 2018-07-16 DIAGNOSIS — E1165 Type 2 diabetes mellitus with hyperglycemia: Secondary | ICD-10-CM | POA: Diagnosis not present

## 2018-07-16 DIAGNOSIS — E039 Hypothyroidism, unspecified: Secondary | ICD-10-CM | POA: Diagnosis not present

## 2018-08-02 DIAGNOSIS — I700 Atherosclerosis of aorta: Secondary | ICD-10-CM | POA: Diagnosis not present

## 2018-08-02 DIAGNOSIS — I739 Peripheral vascular disease, unspecified: Secondary | ICD-10-CM | POA: Diagnosis not present

## 2018-08-02 DIAGNOSIS — E1151 Type 2 diabetes mellitus with diabetic peripheral angiopathy without gangrene: Secondary | ICD-10-CM | POA: Diagnosis not present

## 2018-08-02 DIAGNOSIS — Z79899 Other long term (current) drug therapy: Secondary | ICD-10-CM | POA: Diagnosis not present

## 2018-08-02 DIAGNOSIS — E039 Hypothyroidism, unspecified: Secondary | ICD-10-CM | POA: Diagnosis not present

## 2018-08-02 DIAGNOSIS — I480 Paroxysmal atrial fibrillation: Secondary | ICD-10-CM | POA: Diagnosis not present

## 2018-08-02 DIAGNOSIS — Z0000 Encounter for general adult medical examination without abnormal findings: Secondary | ICD-10-CM | POA: Diagnosis not present

## 2018-08-05 DIAGNOSIS — C44222 Squamous cell carcinoma of skin of right ear and external auricular canal: Secondary | ICD-10-CM | POA: Diagnosis not present

## 2018-08-05 DIAGNOSIS — D485 Neoplasm of uncertain behavior of skin: Secondary | ICD-10-CM | POA: Diagnosis not present

## 2018-08-05 DIAGNOSIS — L718 Other rosacea: Secondary | ICD-10-CM | POA: Diagnosis not present

## 2018-08-05 DIAGNOSIS — X32XXXA Exposure to sunlight, initial encounter: Secondary | ICD-10-CM | POA: Diagnosis not present

## 2018-08-05 DIAGNOSIS — L570 Actinic keratosis: Secondary | ICD-10-CM | POA: Diagnosis not present

## 2018-08-20 DIAGNOSIS — F410 Panic disorder [episodic paroxysmal anxiety] without agoraphobia: Secondary | ICD-10-CM | POA: Diagnosis not present

## 2018-08-20 DIAGNOSIS — K136 Irritative hyperplasia of oral mucosa: Secondary | ICD-10-CM | POA: Diagnosis not present

## 2018-08-20 DIAGNOSIS — I480 Paroxysmal atrial fibrillation: Secondary | ICD-10-CM | POA: Diagnosis not present

## 2018-08-20 DIAGNOSIS — I2510 Atherosclerotic heart disease of native coronary artery without angina pectoris: Secondary | ICD-10-CM | POA: Diagnosis not present

## 2018-08-20 DIAGNOSIS — F411 Generalized anxiety disorder: Secondary | ICD-10-CM | POA: Diagnosis not present

## 2018-08-20 DIAGNOSIS — G3184 Mild cognitive impairment, so stated: Secondary | ICD-10-CM | POA: Diagnosis not present

## 2018-08-26 DIAGNOSIS — C44222 Squamous cell carcinoma of skin of right ear and external auricular canal: Secondary | ICD-10-CM | POA: Diagnosis not present

## 2018-08-29 DIAGNOSIS — K146 Glossodynia: Secondary | ICD-10-CM | POA: Diagnosis not present

## 2018-09-03 ENCOUNTER — Other Ambulatory Visit: Payer: Self-pay | Admitting: Internal Medicine

## 2018-09-03 ENCOUNTER — Other Ambulatory Visit: Payer: Self-pay

## 2018-09-03 NOTE — Telephone Encounter (Signed)
*  STAT* If patient is at the pharmacy, call can be transferred to refill team.   1. Which medications need to be refilled? (please list name of each medication and dose if known) Eliquis  2. Which pharmacy/location (including street and city if local pharmacy) is medication to be sent to? [place]  3. Do they need a 30 day or 90 day supply? [place]

## 2018-09-03 NOTE — Telephone Encounter (Signed)
Please review for refill. Thanks!  

## 2018-09-04 MED ORDER — APIXABAN 5 MG PO TABS: 5.0000 mg | ORAL_TABLET | Freq: Two times a day (BID) | ORAL | 1 refills | Status: DC

## 2018-10-01 DIAGNOSIS — F411 Generalized anxiety disorder: Secondary | ICD-10-CM | POA: Diagnosis not present

## 2018-10-01 DIAGNOSIS — M7918 Myalgia, other site: Secondary | ICD-10-CM | POA: Diagnosis not present

## 2018-10-01 DIAGNOSIS — K146 Glossodynia: Secondary | ICD-10-CM | POA: Diagnosis not present

## 2018-10-10 ENCOUNTER — Other Ambulatory Visit: Payer: Self-pay

## 2018-10-10 ENCOUNTER — Ambulatory Visit (INDEPENDENT_AMBULATORY_CARE_PROVIDER_SITE_OTHER): Payer: PPO | Admitting: *Deleted

## 2018-10-10 DIAGNOSIS — I495 Sick sinus syndrome: Secondary | ICD-10-CM | POA: Diagnosis not present

## 2018-10-10 DIAGNOSIS — S41101A Unspecified open wound of right upper arm, initial encounter: Secondary | ICD-10-CM | POA: Diagnosis not present

## 2018-10-10 DIAGNOSIS — L97822 Non-pressure chronic ulcer of other part of left lower leg with fat layer exposed: Secondary | ICD-10-CM | POA: Diagnosis not present

## 2018-10-10 DIAGNOSIS — H903 Sensorineural hearing loss, bilateral: Secondary | ICD-10-CM | POA: Diagnosis not present

## 2018-10-10 DIAGNOSIS — I87312 Chronic venous hypertension (idiopathic) with ulcer of left lower extremity: Secondary | ICD-10-CM | POA: Diagnosis not present

## 2018-10-10 DIAGNOSIS — H906 Mixed conductive and sensorineural hearing loss, bilateral: Secondary | ICD-10-CM | POA: Diagnosis not present

## 2018-10-10 LAB — CUP PACEART REMOTE DEVICE CHECK
Battery Remaining Longevity: 86 mo
Battery Voltage: 3.01 V
Brady Statistic AP VP Percent: 0.03 %
Brady Statistic AP VS Percent: 69.95 %
Brady Statistic AS VP Percent: 0.01 %
Brady Statistic AS VS Percent: 30.01 %
Brady Statistic RA Percent Paced: 69.97 %
Brady Statistic RV Percent Paced: 0.04 %
Date Time Interrogation Session: 20200402165819
Implantable Lead Implant Date: 20170206
Implantable Lead Implant Date: 20170206
Implantable Lead Location: 753859
Implantable Lead Location: 753860
Implantable Lead Model: 5076
Implantable Lead Model: 5076
Implantable Pulse Generator Implant Date: 20170206
Lead Channel Impedance Value: 380 Ohm
Lead Channel Impedance Value: 380 Ohm
Lead Channel Impedance Value: 418 Ohm
Lead Channel Impedance Value: 437 Ohm
Lead Channel Pacing Threshold Amplitude: 0.5 V
Lead Channel Pacing Threshold Amplitude: 0.75 V
Lead Channel Pacing Threshold Pulse Width: 0.4 ms
Lead Channel Pacing Threshold Pulse Width: 0.4 ms
Lead Channel Sensing Intrinsic Amplitude: 1.125 mV
Lead Channel Sensing Intrinsic Amplitude: 1.125 mV
Lead Channel Sensing Intrinsic Amplitude: 7.875 mV
Lead Channel Sensing Intrinsic Amplitude: 7.875 mV
Lead Channel Setting Pacing Amplitude: 2 V
Lead Channel Setting Pacing Amplitude: 2.5 V
Lead Channel Setting Pacing Pulse Width: 0.4 ms
Lead Channel Setting Sensing Sensitivity: 2.8 mV

## 2018-10-18 ENCOUNTER — Encounter: Payer: Self-pay | Admitting: Cardiology

## 2018-10-18 NOTE — Progress Notes (Signed)
Remote pacemaker transmission.   

## 2018-11-13 DIAGNOSIS — K141 Geographic tongue: Secondary | ICD-10-CM | POA: Diagnosis not present

## 2018-12-24 ENCOUNTER — Telehealth: Payer: Self-pay | Admitting: Internal Medicine

## 2018-12-24 NOTE — Telephone Encounter (Signed)
Patient wants to schedule recall appt. Please advise

## 2018-12-26 NOTE — Telephone Encounter (Signed)
OK to schedule 7/14 at 11:00 am- in office ok, unless he/ family prefer a virtual visit.

## 2019-01-02 DIAGNOSIS — Z79899 Other long term (current) drug therapy: Secondary | ICD-10-CM | POA: Diagnosis not present

## 2019-01-02 DIAGNOSIS — E1151 Type 2 diabetes mellitus with diabetic peripheral angiopathy without gangrene: Secondary | ICD-10-CM | POA: Diagnosis not present

## 2019-01-02 DIAGNOSIS — E039 Hypothyroidism, unspecified: Secondary | ICD-10-CM | POA: Diagnosis not present

## 2019-01-06 DIAGNOSIS — L718 Other rosacea: Secondary | ICD-10-CM | POA: Diagnosis not present

## 2019-01-06 DIAGNOSIS — L570 Actinic keratosis: Secondary | ICD-10-CM | POA: Diagnosis not present

## 2019-01-09 ENCOUNTER — Ambulatory Visit (INDEPENDENT_AMBULATORY_CARE_PROVIDER_SITE_OTHER): Payer: PPO | Admitting: *Deleted

## 2019-01-09 DIAGNOSIS — E7800 Pure hypercholesterolemia, unspecified: Secondary | ICD-10-CM | POA: Diagnosis not present

## 2019-01-09 DIAGNOSIS — E039 Hypothyroidism, unspecified: Secondary | ICD-10-CM | POA: Diagnosis not present

## 2019-01-09 DIAGNOSIS — I495 Sick sinus syndrome: Secondary | ICD-10-CM | POA: Diagnosis not present

## 2019-01-09 DIAGNOSIS — I10 Essential (primary) hypertension: Secondary | ICD-10-CM | POA: Diagnosis not present

## 2019-01-09 DIAGNOSIS — I700 Atherosclerosis of aorta: Secondary | ICD-10-CM | POA: Diagnosis not present

## 2019-01-09 DIAGNOSIS — Z79899 Other long term (current) drug therapy: Secondary | ICD-10-CM | POA: Diagnosis not present

## 2019-01-09 DIAGNOSIS — I480 Paroxysmal atrial fibrillation: Secondary | ICD-10-CM | POA: Diagnosis not present

## 2019-01-09 DIAGNOSIS — I739 Peripheral vascular disease, unspecified: Secondary | ICD-10-CM | POA: Diagnosis not present

## 2019-01-09 DIAGNOSIS — E119 Type 2 diabetes mellitus without complications: Secondary | ICD-10-CM | POA: Diagnosis not present

## 2019-01-09 LAB — CUP PACEART REMOTE DEVICE CHECK
Battery Remaining Longevity: 84 mo
Battery Voltage: 3.01 V
Brady Statistic AP VP Percent: 0.03 %
Brady Statistic AP VS Percent: 66.78 %
Brady Statistic AS VP Percent: 0.02 %
Brady Statistic AS VS Percent: 33.17 %
Brady Statistic RA Percent Paced: 66.81 %
Brady Statistic RV Percent Paced: 0.05 %
Date Time Interrogation Session: 20200702121820
Implantable Lead Implant Date: 20170206
Implantable Lead Implant Date: 20170206
Implantable Lead Location: 753859
Implantable Lead Location: 753860
Implantable Lead Model: 5076
Implantable Lead Model: 5076
Implantable Pulse Generator Implant Date: 20170206
Lead Channel Impedance Value: 361 Ohm
Lead Channel Impedance Value: 380 Ohm
Lead Channel Impedance Value: 380 Ohm
Lead Channel Impedance Value: 418 Ohm
Lead Channel Pacing Threshold Amplitude: 0.375 V
Lead Channel Pacing Threshold Amplitude: 0.75 V
Lead Channel Pacing Threshold Pulse Width: 0.4 ms
Lead Channel Pacing Threshold Pulse Width: 0.4 ms
Lead Channel Sensing Intrinsic Amplitude: 1.125 mV
Lead Channel Sensing Intrinsic Amplitude: 7.375 mV
Lead Channel Setting Pacing Amplitude: 2 V
Lead Channel Setting Pacing Amplitude: 2.5 V
Lead Channel Setting Pacing Pulse Width: 0.4 ms
Lead Channel Setting Sensing Sensitivity: 2.8 mV

## 2019-01-17 ENCOUNTER — Encounter: Payer: Self-pay | Admitting: Cardiology

## 2019-01-17 NOTE — Progress Notes (Signed)
Remote pacemaker transmission.   

## 2019-01-20 ENCOUNTER — Telehealth: Payer: Self-pay | Admitting: Internal Medicine

## 2019-01-20 NOTE — Telephone Encounter (Signed)
Attempted to call the patient at his home & cell #'s for COVID screening questions for his appointment on 7/14 with Dr. Caryl Comes.  No answer at his home #- no voice mail. I left a message to please call back on his cell #.

## 2019-01-21 ENCOUNTER — Other Ambulatory Visit: Payer: Self-pay

## 2019-01-21 ENCOUNTER — Encounter: Payer: Self-pay | Admitting: Internal Medicine

## 2019-01-21 ENCOUNTER — Ambulatory Visit (INDEPENDENT_AMBULATORY_CARE_PROVIDER_SITE_OTHER): Payer: PPO | Admitting: Internal Medicine

## 2019-01-21 VITALS — BP 124/64 | HR 64 | Ht 67.0 in | Wt 172.1 lb

## 2019-01-21 DIAGNOSIS — Z79899 Other long term (current) drug therapy: Secondary | ICD-10-CM

## 2019-01-21 DIAGNOSIS — R001 Bradycardia, unspecified: Secondary | ICD-10-CM

## 2019-01-21 DIAGNOSIS — I480 Paroxysmal atrial fibrillation: Secondary | ICD-10-CM | POA: Diagnosis not present

## 2019-01-21 DIAGNOSIS — Z950 Presence of cardiac pacemaker: Secondary | ICD-10-CM

## 2019-01-21 LAB — CUP PACEART INCLINIC DEVICE CHECK
Battery Remaining Longevity: 84 mo
Battery Voltage: 3.01 V
Brady Statistic AP VP Percent: 0.03 %
Brady Statistic AP VS Percent: 62.2 %
Brady Statistic AS VP Percent: 0.02 %
Brady Statistic AS VS Percent: 37.76 %
Brady Statistic RA Percent Paced: 62.22 %
Brady Statistic RV Percent Paced: 0.04 %
Date Time Interrogation Session: 20200714113247
Implantable Lead Implant Date: 20170206
Implantable Lead Implant Date: 20170206
Implantable Lead Location: 753859
Implantable Lead Location: 753860
Implantable Lead Model: 5076
Implantable Lead Model: 5076
Implantable Pulse Generator Implant Date: 20170206
Lead Channel Impedance Value: 380 Ohm
Lead Channel Impedance Value: 399 Ohm
Lead Channel Impedance Value: 418 Ohm
Lead Channel Impedance Value: 475 Ohm
Lead Channel Pacing Threshold Amplitude: 0.5 V
Lead Channel Pacing Threshold Amplitude: 0.75 V
Lead Channel Pacing Threshold Pulse Width: 0.4 ms
Lead Channel Pacing Threshold Pulse Width: 0.4 ms
Lead Channel Sensing Intrinsic Amplitude: 0.75 mV
Lead Channel Sensing Intrinsic Amplitude: 1.875 mV
Lead Channel Sensing Intrinsic Amplitude: 10.25 mV
Lead Channel Sensing Intrinsic Amplitude: 7 mV
Lead Channel Setting Pacing Amplitude: 2 V
Lead Channel Setting Pacing Amplitude: 2.5 V
Lead Channel Setting Pacing Pulse Width: 0.4 ms
Lead Channel Setting Sensing Sensitivity: 2.8 mV

## 2019-01-21 NOTE — Progress Notes (Signed)
Electrophysiology Office Note   Date:  01/21/2019   ID:  Ernest [name], DOB 1935/08/16, MRN [id number]  PCP:  Derinda Late, MD  Cardiologist:  [facility] Primary Electrophysiologist:   Virl Axe, MD       History of Present Illness: Ernest [name] is a 83 y.o. male seen in electrophysiology followup  for  atrial fibrillation-paroxysmal with rapid ventricular response associated with sinus bradycardia. He is status post pacemaker 2/17 Medtronic.  He is managed with apixaban and amiodarone.  DATE TEST EF   2015 Cath  70% Marginal  9/16 echo  55-60 mild LAE                Date TSH LFTs Cr K Hgb  9/17  3.98 19      12/17  4.12 21      12/18 5.016 15 0.8 4.3 14.3  7/19 4.462 14 0.8 4.5 14.5  6/20 1.96 18 0.9 4.2 14.7   The patient denies chest pain, shortness of breath, nocturnal dyspnea, orthopnea or peripheral edema.  There have been no palpitations, lightheadedness or syncope.   Biggest issues are memory, but more recently gait instability with falls and listing to port   No bleeding   Past Medical History:  Diagnosis Date  . Aortic atherosclerosis ([place])   . Arthritis   . BPH (benign prostatic hypertrophy)   . Coronary artery disease    a. Cath 12/2013->[facility]->Med rx.  . Deviated nasal septum   . Diabetes mellitus without complication ([place])    type 2  . Essential hypertension   . GERD (gastroesophageal reflux disease)   . Hearing deficit    wears hearing aids  . Hyperlipidemia   . 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 PAF (paroxysmal atrial fibrillation) ([place])    a. CHA2DS2VASc = 7-->eliquis;  b. 03/2015 Echo: EF 55-60%, Gr1 DD, mild MR, mildly dil LA, PASP 31mmHg.  Marland Kitchen Pneumonia    as a child  . Presence of permanent cardiac pacemaker 08/16/2015  . PVD (peripheral vascular disease) ([place])   . Rosacea   . Stroke ([place])   . Thyroid disease   . Urine frequency    Past Surgical History:  Procedure Laterality Date  . CARDIAC CATHETERIZATION     [facility] 12/24/13: 30% LM , pLAD, ostial CX. 20% mLAD, mRCA. 70% OM2 (small vessel). Med RX.   Marland Kitchen CARDIAC CATHETERIZATION     [facility]  . CARD[facility]IZATION     [facility]  . CATARACT EXTRACTION W/ INTRAOCULAR LENS IMPLANT     right  . COLONOSCOPY    . EP IMPLANTABLE DEVICE N/A 08/16/2015   Procedure: Pacemaker Implant;  Surgeon: Deboraha Sprang, MD;  Location: [place] CV LAB;  Service: Cardiovascular;  Laterality: N/A;  . LUMBAR LAMINECTOMY/DECOMPRESSION MICRODISCECTOMY N/A 01/04/2015   Procedure: LUMBAR LAMINECTOMY/DECOMPRESSION MICRODISCECTOMY 3 LEVELS;  Surgeon: Newman Pies, MD;  Location: [place] NEURO ORS;  Service: Neurosurgery;  Laterality: N/A;  L23 L34 L45 laminectomies and foraminotomies  . MULTIPLE TOOTH EXTRACTIONS    . PROSTATE BIOPSY       Current Outpatient Medications  Medication Sig Dispense Refill  . amiodarone (PACERONE) 200 MG tablet Please take Amiodarone HCL 200 mg PO Tabs 5 days a week and skip 2 days and repeat. 60 tablet 2  . apixaban (ELIQUIS) 5 MG TABS tablet Take 1 tablet (5 mg total) by mouth 2 (two) times daily. 180 tablet 1  . doxycycline (VIBRAMYCIN) 100 MG capsule Take 100  mg by mouth 2 (two) times daily.     Marland Kitchen glipiZIDE (GLUCOTROL XL) 5 MG 24 hr tablet Take by mouth.    . levothyroxine (SYNTHROID, LEVOTHROID) 112 MCG tablet Take 112 mcg by mouth daily before breakfast.    . lovastatin (MEVACOR) 40 MG tablet Take 40 mg by mouth at bedtime.     . memantine (NAMENDA) 5 MG tablet Take 5 mg by mouth 2 (two) times daily.     . metFORMIN (GLUCOPHAGE-XR) 500 MG 24 hr tablet Take 2 tablets by mouth daily.    . metronidazole (NORITATE) 1 % cream Apply 1 application topically daily.    . Multiple Vitamin (MULTIVITAMIN) tablet Take 1 tablet by mouth daily.    . sertraline (ZOLOFT) 25 MG tablet     . tamsulosin (FLOMAX) 0.4 MG CAPS capsule Take 0.4 mg by mouth 2 (two) times daily.     No current facility-administered medications for this visit.     Allergies:   Patient  has no known allergies.   Social History:  The patient  reports that he has quit smoking. His smoking use included cigarettes. He has never used smokeless tobacco. He reports that he does not drink alcohol or use drugs.   Family History:  The patient's family history includes Cancer in his brother; Cancer - Lung in his brother, brother, and sister; Diabetes in his sister; Heart attack in his father and mother.    ROS:  Please see the history of present illness.   Otherwise, review of systems is negative .    PHYSICAL EXAM: VS:  BP 124/64 (BP Location: Left Arm, Patient Position: Sitting, Cuff Size: Normal)   Pulse 64   Ht 5\' 7"  (1.702 m)   Wt 172 lb 1.9 oz (78.1 kg)   BMI 26.96 kg/m  , BMI Body mass index is 26.96 kg/m. Well developed and well nourished in no acute distress HENT normal Neck supple   Clear Device pocket well healed; without hematoma or erythema.  There is no tethering  Regular rate and rhythm, no  murmur Abd-soft  No Clubbing cyanosis  edema Skin-warm and dry A & Oriented  Grossly normal sensory and motor function  ECG Apacing @ 64 20/10/41    ASSESSMENT AND PLAN:  Atrial fibrillation-paroxysmal  Coronary artery disease- nonobstructive  Pacemaker-Medtronic The patient's device was interrogated.  The information was reviewed. No changes were made in the programming.    Prior stroke  Sinus bradycardia  Amiodarone therapy  Gait instability   Dementia  On Anticoagulation;  No bleeding issues   Euvolemic continue current meds   Infrequent interval atrial fib, and while labs are ok, his gait instability is concerning, esp with the assoc falls  Maybe related to amiodarone, so will stop it. Most neurotoxicity abates relatively quickly, far faster than the t 1/2 would predict  Otherwise tolerating meds     Signed, Virl Axe, MD  01/21/2019 11:41 AM     [facility] [address] [place] [address] [phone]  (office) [phone] (fax)

## 2019-01-21 NOTE — Telephone Encounter (Signed)

## 2019-01-21 NOTE — Patient Instructions (Signed)
Medication Instructions:  - Your physician has recommended you make the following change in your medication:   1) Stop amiodarone  If you need a refill on your cardiac medications before your next appointment, please call your pharmacy.   Lab work: - none ordered  If you have labs (blood work) drawn today and your tests are completely normal, you will receive your results only by: Marland Kitchen MyChart Message (if you have MyChart) OR . A paper copy in the mail If you have any lab test that is abnormal or we need to change your treatment, we will call you to review the results.  Testing/Procedures: - none ordered  Follow-Up: At [facility], you and your health needs are our priority.  As part of our continuing mission to provide you with exceptional heart care, we have created designated Provider Care Teams.  These Care Teams include your primary Cardiologist (physician) and Advanced Practice Providers (APPs -  Physician Assistants and Nurse Practitioners) who all work together to provide you with the care you need, when you need it.  You will need a follow up appointment in 6 months with Dr. Caryl Comes (January).  Please call our office 2 months in advance to schedule this appointment. (Call in early November to schedule).   Any Other Special Instructions Will Be Listed Below (If Applicable). - N/A

## 2019-02-24 ENCOUNTER — Other Ambulatory Visit: Payer: Self-pay | Admitting: Internal Medicine

## 2019-02-24 DIAGNOSIS — I480 Paroxysmal atrial fibrillation: Secondary | ICD-10-CM

## 2019-02-24 DIAGNOSIS — I4891 Unspecified atrial fibrillation: Secondary | ICD-10-CM

## 2019-02-24 DIAGNOSIS — Z5181 Encounter for therapeutic drug level monitoring: Secondary | ICD-10-CM

## 2019-02-25 NOTE — Telephone Encounter (Addendum)
Prescription refill request for Eliquis received.  Last office visit: Dr. Caryl Comes (01-21-2019) Scr: 0.75 (11-15-2017) Age: 83 y.o. Weight: 78.1 kg (01-21-2019)  Pt overdue for lab work. Called pt and spoke with pt and his wife. Pt's wife stated pt only had about a week left of Eliquis. Explained to wife and pt that to continue refilling Eliquis pt needs to come in for lab work Clinical research associate). Put in order for pt to get lab work and pt's wife stated she would call [facility] to get lab work done this week. Spoke with pharm D and clarified it is okay to go ahead and refill pt's prescription.

## 2019-02-27 ENCOUNTER — Other Ambulatory Visit
Admission: RE | Admit: 2019-02-27 | Discharge: 2019-02-27 | Disposition: A | Discharge status: Home / Self Care | Payer: PPO | Source: Ambulatory Visit | Attending: Internal Medicine | Admitting: Internal Medicine

## 2019-02-27 DIAGNOSIS — I480 Paroxysmal atrial fibrillation: Secondary | ICD-10-CM | POA: Diagnosis not present

## 2019-02-27 DIAGNOSIS — I4891 Unspecified atrial fibrillation: Secondary | ICD-10-CM | POA: Diagnosis not present

## 2019-02-27 DIAGNOSIS — Z5181 Encounter for therapeutic drug level monitoring: Secondary | ICD-10-CM | POA: Diagnosis not present

## 2019-02-27 LAB — BASIC METABOLIC PANEL
Anion gap: 4 — ABNORMAL LOW (ref 5–15)
BUN: 21 mg/dL (ref 8–23)
CO2: 28 mmol/L (ref 22–32)
Calcium: 10.1 mg/dL (ref 8.9–10.3)
Chloride: 104 mmol/L (ref 98–111)
Creatinine, Ser: 0.72 mg/dL (ref 0.61–1.24)
GFR calc Af Amer: >60 mL/min (ref >60)
GFR calc non Af Amer: >60 mL/min (ref >60)
Glucose, Bld: 260 mg/dL — ABNORMAL HIGH (ref 70–99)
Potassium: 4.2 mmol/L (ref 3.5–5.1)
Sodium: 136 mmol/L (ref 135–145)

## 2019-02-27 LAB — CBC
HCT: 42.9 % (ref 39.0–52.0)
Hemoglobin: 14.3 g/dL (ref 13.0–17.0)
MCH: 30.2 pg (ref 26.0–34.0)
MCHC: 33.3 g/dL (ref 30.0–36.0)
MCV: 90.7 fL (ref 80.0–100.0)
Platelets: 168 10*3/uL (ref 150–400)
RBC: 4.73 MIL/uL (ref 4.22–5.81)
RDW: 12.6 % (ref 11.5–15.5)
WBC: 7.7 10*3/uL (ref 4.0–10.5)
nRBC: 0 % (ref 0.0–0.2)

## 2019-02-27 NOTE — Addendum Note (Signed)
Addended by: Santiago Bur on: 02/27/2019 01:15 PM   Modules accepted: Orders

## 2019-04-10 ENCOUNTER — Ambulatory Visit (INDEPENDENT_AMBULATORY_CARE_PROVIDER_SITE_OTHER): Payer: PPO | Admitting: *Deleted

## 2019-04-10 DIAGNOSIS — I495 Sick sinus syndrome: Secondary | ICD-10-CM

## 2019-04-12 LAB — CUP PACEART REMOTE DEVICE CHECK
Battery Remaining Longevity: 84 mo
Battery Voltage: 3.01 V
Brady Statistic AP VP Percent: 0.02 %
Brady Statistic AP VS Percent: 42.32 %
Brady Statistic AS VP Percent: 0.02 %
Brady Statistic AS VS Percent: 57.63 %
Brady Statistic RA Percent Paced: 42.34 %
Brady Statistic RV Percent Paced: 0.05 %
Date Time Interrogation Session: 20201002155159
Implantable Lead Implant Date: 20170206
Implantable Lead Implant Date: 20170206
Implantable Lead Location: 753859
Implantable Lead Location: 753860
Implantable Lead Model: 5076
Implantable Lead Model: 5076
Implantable Pulse Generator Implant Date: 20170206
Lead Channel Impedance Value: 380 Ohm
Lead Channel Impedance Value: 399 Ohm
Lead Channel Impedance Value: 399 Ohm
Lead Channel Impedance Value: 437 Ohm
Lead Channel Pacing Threshold Amplitude: 0.375 V
Lead Channel Pacing Threshold Amplitude: 0.75 V
Lead Channel Pacing Threshold Pulse Width: 0.4 ms
Lead Channel Pacing Threshold Pulse Width: 0.4 ms
Lead Channel Sensing Intrinsic Amplitude: 0.875 mV
Lead Channel Sensing Intrinsic Amplitude: 0.875 mV
Lead Channel Sensing Intrinsic Amplitude: 7.375 mV
Lead Channel Sensing Intrinsic Amplitude: 7.375 mV
Lead Channel Setting Pacing Amplitude: 2 V
Lead Channel Setting Pacing Amplitude: 2.5 V
Lead Channel Setting Pacing Pulse Width: 0.4 ms
Lead Channel Setting Sensing Sensitivity: 2.8 mV

## 2019-04-17 ENCOUNTER — Encounter: Payer: Self-pay | Admitting: Cardiology

## 2019-04-17 NOTE — Progress Notes (Signed)
Remote pacemaker transmission.   

## 2019-06-02 ENCOUNTER — Other Ambulatory Visit: Payer: Self-pay

## 2019-06-02 MED ORDER — APIXABAN 5 MG PO TABS: 5.0000 mg | ORAL_TABLET | Freq: Two times a day (BID) | ORAL | 1 refills | Status: DC

## 2019-06-02 NOTE — Telephone Encounter (Signed)
Refill Request.  

## 2019-06-02 NOTE — Telephone Encounter (Signed)
*  STAT* If patient is at the pharmacy, call can be transferred to refill team.   1. Which medications need to be refilled? (please list name of each medication and dose if known) Eliquis  2. Which pharmacy/location (including street and city if local pharmacy) is medication to be sent to? [facility]  3. Do they need a 30 day or 90 day supply? [place]

## 2019-07-08 DIAGNOSIS — E119 Type 2 diabetes mellitus without complications: Secondary | ICD-10-CM | POA: Diagnosis not present

## 2019-07-10 ENCOUNTER — Ambulatory Visit (INDEPENDENT_AMBULATORY_CARE_PROVIDER_SITE_OTHER): Payer: PPO | Admitting: *Deleted

## 2019-07-10 DIAGNOSIS — R001 Bradycardia, unspecified: Secondary | ICD-10-CM | POA: Diagnosis not present

## 2019-07-10 LAB — CUP PACEART REMOTE DEVICE CHECK
Battery Remaining Longevity: 84 mo
Battery Voltage: 3.01 V
Brady Statistic AP VP Percent: 0.01 %
Brady Statistic AP VS Percent: 21.67 %
Brady Statistic AS VP Percent: 0.03 %
Brady Statistic AS VS Percent: 78.28 %
Brady Statistic RA Percent Paced: 21.69 %
Brady Statistic RV Percent Paced: 0.04 %
Date Time Interrogation Session: 20201231144649
Implantable Lead Implant Date: 20170206
Implantable Lead Implant Date: 20170206
Implantable Lead Location: 753859
Implantable Lead Location: 753860
Implantable Lead Model: 5076
Implantable Lead Model: 5076
Implantable Pulse Generator Implant Date: 20170206
Lead Channel Impedance Value: 380 Ohm
Lead Channel Impedance Value: 380 Ohm
Lead Channel Impedance Value: 399 Ohm
Lead Channel Impedance Value: 437 Ohm
Lead Channel Pacing Threshold Amplitude: 0.375 V
Lead Channel Pacing Threshold Amplitude: 0.875 V
Lead Channel Pacing Threshold Pulse Width: 0.4 ms
Lead Channel Pacing Threshold Pulse Width: 0.4 ms
Lead Channel Sensing Intrinsic Amplitude: 1.75 mV
Lead Channel Sensing Intrinsic Amplitude: 1.75 mV
Lead Channel Sensing Intrinsic Amplitude: 7.5 mV
Lead Channel Sensing Intrinsic Amplitude: 7.5 mV
Lead Channel Setting Pacing Amplitude: 2 V
Lead Channel Setting Pacing Amplitude: 2.5 V
Lead Channel Setting Pacing Pulse Width: 0.4 ms
Lead Channel Setting Sensing Sensitivity: 2.8 mV

## 2019-07-11 NOTE — Progress Notes (Signed)
PPM remote 

## 2019-08-05 ENCOUNTER — Telehealth: Payer: Self-pay | Admitting: Internal Medicine

## 2019-08-05 NOTE — Telephone Encounter (Signed)
I spoke with the patient's wife. She states the patient has had a few episodes of elevated HR's over the last few days. HR's will get up to 102-103 bpm.  The patient will complain of slight chest pain at that time as well. O2 sats are good at 97%.   Mrs. Fugitt states that the patient's dementia is also getting worse as well. He will often times get agitated with her trying to take care of him. He hallucinates that there are 20-30 people in the house.  The patient's elevated HR's occur with his agitation and his chest occurs at this time as well.   I have advised Mrs. Winokur that I do not feel like he is in immediate danger from a Cardiac perspective. The patient is due to see Dr. Loney Hering on 2/9 to discuss his dementia meds further.  I have asked her to call me after that appointment and if Dr. Loney Hering feels we need to move up the patient's cardiology appt we can do this. She is also aware to call me if chest pain worsens/ becomes more frequent and is not associated with agitation.  She voices understanding of the above and is agreeable.

## 2019-08-05 NOTE — Telephone Encounter (Signed)
STAT if HR is under 50 or over 120 (normal HR is 60-100 beats per minute)  1) What is your heart rate? 102 last night  2) Do you have a log of your heart rate readings (document readings)?  67-68 range this morning  3) Do you have any other symptoms? States chest bothers him a little bit, also has been hallucinating from dementia States O2 levels were good. Wife just curious if patient should be seen before March visit with Dr. Caryl Comes

## 2019-08-08 ENCOUNTER — Encounter: Payer: Self-pay | Admitting: Emergency Medicine

## 2019-08-08 ENCOUNTER — Emergency Department: Payer: PPO

## 2019-08-08 ENCOUNTER — Inpatient Hospital Stay
Admission: EM | Admit: 2019-08-08 | Discharge: 2019-08-10 | DRG: 309 | Disposition: A | Discharge status: Home / Self Care | Payer: PPO | Attending: Internal Medicine | Admitting: Internal Medicine

## 2019-08-08 ENCOUNTER — Other Ambulatory Visit: Payer: Self-pay

## 2019-08-08 DIAGNOSIS — R0789 Other chest pain: Secondary | ICD-10-CM | POA: Diagnosis not present

## 2019-08-08 DIAGNOSIS — Z20822 Contact with and (suspected) exposure to covid-19: Secondary | ICD-10-CM | POA: Diagnosis not present

## 2019-08-08 DIAGNOSIS — R000 Tachycardia, unspecified: Secondary | ICD-10-CM | POA: Diagnosis present

## 2019-08-08 DIAGNOSIS — F329 Major depressive disorder, single episode, unspecified: Secondary | ICD-10-CM | POA: Diagnosis present

## 2019-08-08 DIAGNOSIS — E119 Type 2 diabetes mellitus without complications: Secondary | ICD-10-CM

## 2019-08-08 DIAGNOSIS — I4891 Unspecified atrial fibrillation: Secondary | ICD-10-CM | POA: Diagnosis not present

## 2019-08-08 DIAGNOSIS — E039 Hypothyroidism, unspecified: Secondary | ICD-10-CM | POA: Diagnosis not present

## 2019-08-08 DIAGNOSIS — Z03818 Encounter for observation for suspected exposure to other biological agents ruled out: Secondary | ICD-10-CM | POA: Diagnosis not present

## 2019-08-08 DIAGNOSIS — Z950 Presence of cardiac pacemaker: Secondary | ICD-10-CM

## 2019-08-08 DIAGNOSIS — Z79899 Other long term (current) drug therapy: Secondary | ICD-10-CM

## 2019-08-08 DIAGNOSIS — Z7984 Long term (current) use of oral hypoglycemic drugs: Secondary | ICD-10-CM

## 2019-08-08 DIAGNOSIS — I495 Sick sinus syndrome: Secondary | ICD-10-CM | POA: Diagnosis not present

## 2019-08-08 DIAGNOSIS — I480 Paroxysmal atrial fibrillation: Secondary | ICD-10-CM | POA: Diagnosis not present

## 2019-08-08 DIAGNOSIS — G4700 Insomnia, unspecified: Secondary | ICD-10-CM | POA: Diagnosis not present

## 2019-08-08 DIAGNOSIS — Z961 Presence of intraocular lens: Secondary | ICD-10-CM | POA: Diagnosis present

## 2019-08-08 DIAGNOSIS — Z9181 History of falling: Secondary | ICD-10-CM

## 2019-08-08 DIAGNOSIS — F0391 Unspecified dementia with behavioral disturbance: Secondary | ICD-10-CM | POA: Diagnosis present

## 2019-08-08 DIAGNOSIS — I10 Essential (primary) hypertension: Secondary | ICD-10-CM | POA: Diagnosis not present

## 2019-08-08 DIAGNOSIS — H9193 Unspecified hearing loss, bilateral: Secondary | ICD-10-CM | POA: Diagnosis not present

## 2019-08-08 DIAGNOSIS — E1151 Type 2 diabetes mellitus with diabetic peripheral angiopathy without gangrene: Secondary | ICD-10-CM | POA: Diagnosis present

## 2019-08-08 DIAGNOSIS — F419 Anxiety disorder, unspecified: Secondary | ICD-10-CM | POA: Diagnosis present

## 2019-08-08 DIAGNOSIS — Z9841 Cataract extraction status, right eye: Secondary | ICD-10-CM

## 2019-08-08 DIAGNOSIS — Z8673 Personal history of transient ischemic attack (TIA), and cerebral infarction without residual deficits: Secondary | ICD-10-CM

## 2019-08-08 DIAGNOSIS — Z8249 Family history of ischemic heart disease and other diseases of the circulatory system: Secondary | ICD-10-CM

## 2019-08-08 DIAGNOSIS — F05 Delirium due to known physiological condition: Secondary | ICD-10-CM | POA: Diagnosis not present

## 2019-08-08 DIAGNOSIS — F0390 Unspecified dementia without behavioral disturbance: Secondary | ICD-10-CM | POA: Diagnosis not present

## 2019-08-08 DIAGNOSIS — L930 Discoid lupus erythematosus: Secondary | ICD-10-CM | POA: Diagnosis not present

## 2019-08-08 DIAGNOSIS — R079 Chest pain, unspecified: Secondary | ICD-10-CM

## 2019-08-08 DIAGNOSIS — I2510 Atherosclerotic heart disease of native coronary artery without angina pectoris: Secondary | ICD-10-CM | POA: Diagnosis present

## 2019-08-08 DIAGNOSIS — I499 Cardiac arrhythmia, unspecified: Secondary | ICD-10-CM | POA: Diagnosis not present

## 2019-08-08 DIAGNOSIS — Z7901 Long term (current) use of anticoagulants: Secondary | ICD-10-CM

## 2019-08-08 DIAGNOSIS — E785 Hyperlipidemia, unspecified: Secondary | ICD-10-CM | POA: Diagnosis not present

## 2019-08-08 DIAGNOSIS — K219 Gastro-esophageal reflux disease without esophagitis: Secondary | ICD-10-CM | POA: Diagnosis not present

## 2019-08-08 DIAGNOSIS — Z79890 Hormone replacement therapy (postmenopausal): Secondary | ICD-10-CM

## 2019-08-08 DIAGNOSIS — F03918 Unspecified dementia, unspecified severity, with other behavioral disturbance: Secondary | ICD-10-CM | POA: Diagnosis present

## 2019-08-08 DIAGNOSIS — N400 Enlarged prostate without lower urinary tract symptoms: Secondary | ICD-10-CM | POA: Diagnosis not present

## 2019-08-08 DIAGNOSIS — Z833 Family history of diabetes mellitus: Secondary | ICD-10-CM

## 2019-08-08 DIAGNOSIS — Z87891 Personal history of nicotine dependence: Secondary | ICD-10-CM

## 2019-08-08 DIAGNOSIS — R404 Transient alteration of awareness: Secondary | ICD-10-CM | POA: Diagnosis not present

## 2019-08-08 DIAGNOSIS — I700 Atherosclerosis of aorta: Secondary | ICD-10-CM | POA: Diagnosis not present

## 2019-08-08 DIAGNOSIS — Z801 Family history of malignant neoplasm of trachea, bronchus and lung: Secondary | ICD-10-CM

## 2019-08-08 LAB — CBC WITH DIFFERENTIAL/PLATELET
Abs Immature Granulocytes: 0.02 10*3/uL (ref 0.00–0.07)
Basophils Absolute: 0.1 10*3/uL (ref 0.0–0.1)
Basophils Relative: 1 %
Eosinophils Absolute: 0.1 10*3/uL (ref 0.0–0.5)
Eosinophils Relative: 2 %
HCT: 42.9 % (ref 39.0–52.0)
Hemoglobin: 14.9 g/dL (ref 13.0–17.0)
Immature Granulocytes: 0 %
Lymphocytes Relative: 25 %
Lymphs Abs: 1.9 10*3/uL (ref 0.7–4.0)
MCH: 29.9 pg (ref 26.0–34.0)
MCHC: 34.7 g/dL (ref 30.0–36.0)
MCV: 86.1 fL (ref 80.0–100.0)
Monocytes Absolute: 0.7 10*3/uL (ref 0.1–1.0)
Monocytes Relative: 9 %
Neutro Abs: 5 10*3/uL (ref 1.7–7.7)
Neutrophils Relative %: 63 %
Platelets: 170 10*3/uL (ref 150–400)
RBC: 4.98 MIL/uL (ref 4.22–5.81)
RDW: 12.5 % (ref 11.5–15.5)
WBC: 7.9 10*3/uL (ref 4.0–10.5)
nRBC: 0 % (ref 0.0–0.2)

## 2019-08-08 LAB — T4, FREE: Free T4: 1.55 ng/dL — ABNORMAL HIGH (ref 0.61–1.12)

## 2019-08-08 LAB — COMPREHENSIVE METABOLIC PANEL
ALT: 23 U/L (ref 0–44)
AST: 18 U/L (ref 15–41)
Albumin: 3.7 g/dL (ref 3.5–5.0)
Alkaline Phosphatase: 75 U/L (ref 38–126)
Anion gap: 8 (ref 5–15)
BUN: 17 mg/dL (ref 8–23)
CO2: 24 mmol/L (ref 22–32)
Calcium: 9.6 mg/dL (ref 8.9–10.3)
Chloride: 104 mmol/L (ref 98–111)
Creatinine, Ser: 0.72 mg/dL (ref 0.61–1.24)
GFR calc Af Amer: >60 mL/min (ref >60)
GFR calc non Af Amer: >60 mL/min (ref >60)
Glucose, Bld: 311 mg/dL — ABNORMAL HIGH (ref 70–99)
Potassium: 3.9 mmol/L (ref 3.5–5.1)
Sodium: 136 mmol/L (ref 135–145)
Total Bilirubin: 0.8 mg/dL (ref 0.3–1.2)
Total Protein: 6.4 g/dL — ABNORMAL LOW (ref 6.5–8.1)

## 2019-08-08 LAB — MAGNESIUM: Magnesium: 1.6 mg/dL — ABNORMAL LOW (ref 1.7–2.4)

## 2019-08-08 LAB — TSH: TSH: 0.355 u[IU]/mL (ref 0.350–4.500)

## 2019-08-08 MED ORDER — ZOLPIDEM TARTRATE 5 MG PO TABS: 5.0000 mg | ORAL_TABLET | Freq: Every evening | ORAL | Status: DC | PRN

## 2019-08-08 MED ORDER — LEVOTHYROXINE SODIUM 100 MCG PO TABS
100.0000 ug | ORAL_TABLET | Freq: Every day | ORAL | Status: DC
  Administered 2019-08-09 – 2019-08-10 (×2): 100 ug via ORAL
  Filled 2019-08-08 (×2): qty 1

## 2019-08-08 MED ORDER — TAMSULOSIN HCL 0.4 MG PO CAPS
0.4000 mg | ORAL_CAPSULE | Freq: Two times a day (BID) | ORAL | Status: DC
  Administered 2019-08-08 – 2019-08-10 (×4): 0.4 mg via ORAL
  Filled 2019-08-08 (×4): qty 1

## 2019-08-08 MED ORDER — DIPHENHYDRAMINE HCL 50 MG/ML IJ SOLN
25.0000 mg | Freq: Every evening | INTRAMUSCULAR | Status: DC | PRN
  Administered 2019-08-09 – 2019-08-10 (×2): 25 mg via INTRAVENOUS
  Filled 2019-08-08 (×2): qty 1

## 2019-08-08 MED ORDER — HALOPERIDOL LACTATE 5 MG/ML IJ SOLN
INTRAMUSCULAR | Status: AC
  Filled 2019-08-08: qty 1

## 2019-08-08 MED ORDER — LEVOTHYROXINE SODIUM 25 MCG PO TABS: 125.0000 ug | ORAL_TABLET | Freq: Every day | ORAL | Status: DC

## 2019-08-08 MED ORDER — ADULT MULTIVITAMIN W/MINERALS CH
1.0000 | ORAL_TABLET | Freq: Every day | ORAL | Status: DC
  Administered 2019-08-09 – 2019-08-10 (×2): 1 via ORAL
  Filled 2019-08-08 (×2): qty 1

## 2019-08-08 MED ORDER — APIXABAN 5 MG PO TABS
5.0000 mg | ORAL_TABLET | Freq: Two times a day (BID) | ORAL | Status: DC
  Administered 2019-08-08 – 2019-08-10 (×4): 5 mg via ORAL
  Filled 2019-08-08 (×4): qty 1

## 2019-08-08 MED ORDER — DILTIAZEM HCL-DEXTROSE 125-5 MG/125ML-% IV SOLN (PREMIX): 5.0000 mg/h | INTRAVENOUS | Status: DC

## 2019-08-08 MED ORDER — SODIUM CHLORIDE 0.9 % IV BOLUS
500.0000 mL | Freq: Once | INTRAVENOUS | Status: AC
  Administered 2019-08-08: 500 mL via INTRAVENOUS

## 2019-08-08 MED ORDER — MEMANTINE HCL 5 MG PO TABS
5.0000 mg | ORAL_TABLET | Freq: Two times a day (BID) | ORAL | Status: DC
  Administered 2019-08-08 – 2019-08-10 (×4): 5 mg via ORAL
  Filled 2019-08-08 (×6): qty 1

## 2019-08-08 MED ORDER — DILTIAZEM HCL 25 MG/5ML IV SOLN
10.0000 mg | Freq: Once | INTRAVENOUS | Status: AC
  Administered 2019-08-08: 10 mg via INTRAVENOUS
  Filled 2019-08-08: qty 5

## 2019-08-08 MED ORDER — SODIUM CHLORIDE 0.9 % IV SOLN: INTRAVENOUS | Status: DC

## 2019-08-08 MED ORDER — MAGNESIUM SULFATE 2 GM/50ML IV SOLN
2.0000 g | Freq: Once | INTRAVENOUS | Status: AC
  Administered 2019-08-08: 2 g via INTRAVENOUS
  Filled 2019-08-08: qty 50

## 2019-08-08 MED ORDER — HALOPERIDOL LACTATE 5 MG/ML IJ SOLN
1.0000 mg | Freq: Four times a day (QID) | INTRAMUSCULAR | Status: DC | PRN
  Administered 2019-08-08: 1 mg via INTRAMUSCULAR

## 2019-08-08 MED ORDER — OLANZAPINE 5 MG PO TBDP
2.5000 mg | ORAL_TABLET | Freq: Four times a day (QID) | ORAL | Status: DC | PRN
  Filled 2019-08-08: qty 0.5

## 2019-08-08 MED ORDER — ALPRAZOLAM 0.25 MG PO TABS
0.2500 mg | ORAL_TABLET | Freq: Two times a day (BID) | ORAL | Status: DC | PRN
  Administered 2019-08-08: 22:00:00 0.25 mg via ORAL
  Filled 2019-08-08: qty 1

## 2019-08-08 MED ORDER — SERTRALINE HCL 50 MG PO TABS
25.0000 mg | ORAL_TABLET | Freq: Every day | ORAL | Status: DC
  Administered 2019-08-09 – 2019-08-10 (×2): 25 mg via ORAL
  Filled 2019-08-08 (×2): qty 1

## 2019-08-08 MED ORDER — LORAZEPAM 2 MG/ML IJ SOLN
0.5000 mg | INTRAMUSCULAR | Status: DC | PRN
  Administered 2019-08-09 – 2019-08-10 (×3): 0.5 mg via INTRAVENOUS
  Filled 2019-08-08 (×4): qty 1

## 2019-08-08 MED ORDER — DILTIAZEM HCL-DEXTROSE 125-5 MG/125ML-% IV SOLN (PREMIX)
5.0000 mg/h | INTRAVENOUS | Status: DC
  Administered 2019-08-08: 5 mg/h via INTRAVENOUS
  Filled 2019-08-08: qty 125

## 2019-08-08 MED ORDER — ACETAMINOPHEN 325 MG PO TABS: 650.0000 mg | ORAL_TABLET | ORAL | Status: DC | PRN

## 2019-08-08 MED ORDER — ONDANSETRON HCL 4 MG/2ML IJ SOLN: 4.0000 mg | Freq: Four times a day (QID) | INTRAMUSCULAR | Status: DC | PRN

## 2019-08-08 NOTE — ED Notes (Signed)
Pt attempting to climb out of bed and pull IV and external catheter out. Wife at bedside and declines wanting a sitter if she remains at bedside but if she has to leave this evening is requesting a Actuary.   Sleeve placed over IV so patient will not continue to pull on tubing.

## 2019-08-08 NOTE — H&P (Addendum)
[facility] [place] NAME: Ernest [name]    MR#:  [id number]  DATE OF BIRTH:  Mar 04, 1936  DATE OF ADMISSION:  08/08/2019  PRIMARY CARE PHYSICIAN: Derinda Late, MD   REQUESTING/REFERRING PHYSICIAN: Derrell Lolling, MD CHIEF COMPLAINT:   Chief Complaint  Patient presents with  . Tachycardia    HISTORY OF PRESENT ILLNESS:  Ernest [name]  is a 84 y.o. male with a known history of multiple problems documented below, including dementia with behavioral changes, hypertension, dyslipidemia, coronary artery disease, and paroxysmal atrial fibrillation on Eliquis, who presented to the emergency room with acute onset of palpitations and associated chest pain.  He has been having bouts of atrial fibrillation up to 102-103 per his wife who gives all the history due to his restlessness.  This afternoon she states that she he had a spell where he was not feeling good and was not acting right.  He was fairly confused.  He had urinary incontinence.  He did not have any reported fever or chills nausea or vomiting or abdominal pain.  No dysuria, oliguria or hematuria or flank pain.  He has been having visual and auditory hallucinations with his dementia lately.  His amiodarone was recently discontinued by his cardiologist.  During my interview the patient was trying to remove his IVs and was fairly agitated and restless.  Upon presentation to the emergency room, temperature was 97.4 heart rate was 163 with otherwise normal vital signs except for tachypnea later.  Labs are remarkable for magnesium 1.6.EKG showed atrial fibrillation with rapid ventricular response of 137.  COVID-19 PCR is currently pending.  Portable chest ray showed no acute cardiopulmonary disease with low lung volumes.  The patient was given IV Cardizem bolus and drip as well as IV normal saline bolus of 500 mL.  He converted to normal sinus rhythm in the ER.  He was later showing a paced rhythm.  He will be  admitted to a telemetry bed for further evaluation and management. PAST MEDICAL HISTORY:   Past Medical History:  Diagnosis Date  . Aortic atherosclerosis ([place])   . Arthritis   . BPH (benign prostatic hypertrophy)   . Coronary artery disease    a. Cath 12/2013->[facility]->Med rx.  . Deviated nasal septum   . Diabetes mellitus without complication ([place])    type 2  . Essential hypertension   . GERD (gastroesophageal reflux disease)   . Hearing deficit    wears hearing aids  . Hyperlipidemia   . 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 PAF (paroxysmal atrial fibrillation) ([place])    a. CHA2DS2VASc = 7-->eliquis;  b. 03/2015 Echo: EF 55-60%, Gr1 DD, mild MR, mildly dil LA, PASP 31mmHg.  Marland Kitchen Pneumonia    as a child  . Presence of permanent cardiac pacemaker 08/16/2015  . PVD (peripheral vascular disease) ([place])   . Rosacea   . Stroke ([place])   . Thyroid disease   . Urine frequency     PAST SURGICAL HISTORY:   Past Surgical History:  Procedure Laterality Date  . CARDIAC CATHETERIZATION     [facility] 12/24/13: 30% LM , pLAD, ostial CX. 20% mLAD, mRCA. 70% OM2 (small vessel). Med RX.   Marland Kitchen CARDIAC CATHETERIZATION     [facility]  . CARD[facility]AC [facility]TERIZATION     [facility]  . CATARACT EXTRACTION W/ INTRAOCULAR LENS IMPLANT     right  . COLONOSCOPY    . EP IMPLANTABLE DEVICE N/A 08/16/2015  Procedure: Pacemaker Implant;  Surgeon: Deboraha Sprang, MD;  Location: [place] CV LAB;  Service: Cardiovascular;  Laterality: N/A;  . LUMBAR LAMINECTOMY/DECOMPRESSION MICRODISCECTOMY N/A 01/04/2015   Procedure: LUMBAR LAMINECTOMY/DECOMPRESSION MICRODISCECTOMY 3 LEVELS;  Surgeon: Newman Pies, MD;  Location: [place] NEURO ORS;  Service: Neurosurgery;  Laterality: N/A;  L23 L34 L45 laminectomies and foraminotomies  . MULTIPLE TOOTH EXTRACTIONS    . PROSTATE BIOPSY      SOCIAL HISTORY:   Social History   Tobacco Use  . Smoking status: Former Smoker    Types: Cigarettes  . Smokeless tobacco: Never Used  . Tobacco  comment: " Quit smoking cigarettes in 1983 "  Substance Use Topics  . Alcohol use: No    FAMILY HISTORY:   Family History  Problem Relation Age of Onset  . Heart attack Mother   . Heart attack Father   . Cancer - Lung Sister   . Cancer - Lung Brother   . Cancer - Lung Brother   . Cancer Brother   . Diabetes Sister     DRUG ALLERGIES:  No Known Allergies  REVIEW OF SYSTEMS:   ROS As per history of present illness. All pertinent systems were reviewed above. Constitutional,  HEENT, cardiovascular, respiratory, GI, GU, musculoskeletal, neuro, psychiatric, endocrine,  integumentary and hematologic systems were reviewed and are otherwise  negative/unremarkable except for positive findings mentioned above in the HPI.   MEDICATIONS AT HOME:   Prior to Admission medications   Medication Sig Start Date End Date Taking? Authorizing Provider  apixaban (ELIQUIS) 5 MG TABS tablet Take 1 tablet (5 mg total) by mouth 2 (two) times daily. 06/02/19  Yes Deboraha Sprang, MD  glipiZIDE (GLUCOTROL XL) 5 MG 24 hr tablet Take 5 mg by mouth daily.  01/13/19 01/13/20 Yes [provider]  levothyroxine (SYNTHROID) 125 MCG tablet Take 125 mcg by mouth daily before breakfast.    Yes [provider]  lovastatin (MEVACOR) 40 MG tablet Take 40 mg by mouth at bedtime.    Yes [provider]  memantine (NAMENDA) 5 MG tablet Take 5 mg by mouth 2 (two) times daily.  02/19/18  Yes [provider]  metFORMIN (GLUCOPHAGE-XR) 500 MG 24 hr tablet Take 1,000 mg by mouth daily.   Yes [provider]  metronidazole (NORITATE) 1 % cream Apply 1 application topically daily.   Yes [provider]  Multiple Vitamin (MULTIVITAMIN) tablet Take 1 tablet by mouth daily.   Yes [provider]  sertraline (ZOLOFT) 25 MG tablet Take 25 mg by mouth daily.  12/12/18  Yes [provider]  tamsulosin (FLOMAX) 0.4 MG CAPS capsule Take 0.4 mg by mouth 2 (two) times  daily.   Yes [provider]      VITAL SIGNS:  Blood pressure 130/76, pulse (!) 107, temperature (!) 97.4 F (36.3 C), temperature source Oral, resp. rate 18, height 5\' 6"  (1.676 m), weight 79.4 kg, SpO2 94 %.  PHYSICAL EXAMINATION:  Physical Exam  GENERAL:  84 y.o.-year-old very restless Caucasian male patient lying in the bed with no respiratory distress.  EYES: Pupils equal, round, reactive to light and accommodation. No scleral icterus. Extraocular muscles intact.  HEENT: Head atraumatic, normocephalic. Oropharynx and nasopharynx clear.  NECK:  Supple, no jugular venous distention. No thyroid enlargement, no tenderness.  LUNGS: Normal breath sounds bilaterally, no wheezing, rales,rhonchi or crepitation. No use of accessory muscles of respiration.  CARDIOVASCULAR: Mildly tachycardic, regular rate and rhythm, S1, S2 normal. No murmurs,  rubs, or gallops.  ABDOMEN: Soft, nondistended, nontender. Bowel sounds present. No organomegaly or mass.  EXTREMITIES: No pedal edema, cyanosis, or clubbing.  NEUROLOGIC: Cranial nerves II through XII are intact. Muscle strength 5/5 in all extremities. Sensation intact. Gait not checked.  PSYCHIATRIC: The patient is alert and oriented x 3.  Normal affect and good eye contact. SKIN: No obvious rash, lesion, or ulcer.   LABORATORY PANEL:   CBC Recent Labs  Lab 08/08/19 1713  WBC 7.9  HGB 14.9  HCT 42.9  PLT 170   ------------------------------------------------------------------------------------------------------------------  Chemistries  Recent Labs  Lab 08/08/19 1713  NA 136  K 3.9  CL 104  CO2 24  GLUCOSE 311*  BUN 17  CREATININE 0.72  CALCIUM 9.6  MG 1.6*  AST 18  ALT 23  ALKPHOS 75  BILITOT 0.8   ------------------------------------------------------------------------------------------------------------------  Cardiac Enzymes No results for input(s): TROPONINI in the last 168  hours. ------------------------------------------------------------------------------------------------------------------  RADIOLOGY:  DG Chest Port 1 View  Result Date: 08/08/2019 CLINICAL DATA:  AFib with rapid ventricular response EXAM: PORTABLE CHEST 1 VIEW COMPARISON:  08/17/2015 FINDINGS: Left-sided pacing device with leads over the right atrium and right ventricle. Low lung volumes. No consolidation, pleural effusion or pneumothorax. Normal heart size. Aortic atherosclerosis. Apical pleural thickening and scarring. No pneumothorax. IMPRESSION: No active disease.  Low lung volumes. Electronically Signed   By: Donavan Foil M.D.   On: 08/08/2019 17:40      IMPRESSION AND PLAN:   1.  Paroxysmal atrial fibrillation with rapid ventricular response with subsequent chest pain.  This could be exacerbated by hypomagnesemia.  The patient will be admitted to telemetry bed.  He converted to sinus rhythm on IV Cardizem drip and is currently in paced rhythm.  Will obtain a 2D echo and a cardiology consultation in a.m.  I notified Dr. Aundra Dubin about the patient.  We will continue his Eliquis and cut down his Synthroid to 100 mcg.  We will follow serial cardiac enzymes.  We will replace magnesium and follow magnesium level.  2.  Hypothyroidism with mild overcorrection.  TSH was low on free T4 is elevated in the setting of atrial fibrillation with RVR.  We will cut down his Synthroid dose to 100 MCG daily.  3.  Altered mental status.  This is likely associated with behavioral changes with his dementia.  He will be placed on as needed IM Haldol and if needed IV Ativan due to significant restlessness.  Will obtain a psychiatry evaluation as well.  I notified Dr. Weber Cooks about him.  We will continue his Namenda and place him on as needed p.o. Zyprexa Zydis.  4.  Type 2 diabetes mellitus without complications.  We will hold off Metformin and Glucotrol XL for now and place him on supplement coverage with  NovoLog.  5.  Dyslipidemia.  We will continue statin therapy.  6.  Depression.  We will continue Zoloft.  7.  DVT prophylaxis.  We will continue Eliquis.   All the records are reviewed and case discussed with ED provider.  The plan of care was discussed in details with the patient's wife and answered all questions. The patient agreed to proceed with the above mentioned plan. Further management will depend upon hospital course.   CODE STATUS: Full code  TOTAL TIME TAKING CARE OF THIS PATIENT: 55 minutes.    Christel Mormon M.D on 08/08/2019 at 7:13 PM  [facility]   From 7 PM-7 AM, contact night-coverage [email or web address]  CC: Primary  care physician; Derinda Late, MD   Note: This dictation was prepared with Dragon dictation along with smaller phrase technology. Any transcriptional errors that result from this process are unintentional.

## 2019-08-08 NOTE — ED Triage Notes (Signed)
Pt presents form home via [facility] with c/o afrib rvr. Pt has pacemaker. 500 normal saline given in route by ems. 6 mg adenosine given in route, ems reports HR up to 200 bpm in route. Pt HR 142 bpm at this time. Pt alert but confused. Pt has hx of dementia. Pt is at baseline cognitive status.

## 2019-08-08 NOTE — Progress Notes (Signed)
ED nurse states patient is NSR in the 60. Notified NP Randol Kern, she has discontinued the drip.

## 2019-08-08 NOTE — Progress Notes (Signed)
Patient is very confused. Patient has removed his telemetry monitor, gown and external catheter. Wife is at bedside. Patient is impulsive and is attempting to go home. Unable to administer medications for anxiety. Patient is very uncooperative. Dr. Sidney Ace has been updated about his current condition.

## 2019-08-08 NOTE — ED Provider Notes (Signed)
[facility] Emergency Department Provider Note  ____________________________________________   First MD Initiated Contact with Patient 08/08/19 1716     (approximate)  I have reviewed the triage vital signs and the nursing notes.  History  Chief Complaint Tachycardia    HPI Ernest [name] is a 84 y.o. male with history of dementia, hx of pAF with RVR associated with sinus bradycardia/sinus node dysfunction s/p [employer] pacemaker in 2017 (followed by [facility]) who presents to the emergency department for AF with RVR.  Patient had reportedly complained of some chest pain to his wife, who noticed his fluctuating HR and activated EMS, noted to be in AF with RVR.  HR reportedly up to 190s/200s in transport.  EMS administered 6 mg adenosine to evaluate underlying rhythm, confirmed atrial fibrillation.  On arrival arrives normotensive, heart rate 130s/140s.  History limited due to patient significant dementia.  However, he denies any acute complaints at this time.  Specifically denies any chest pain, shortness of breath, nausea, vomiting, or other discomfort.  On chart review, he is on Eliquis. Looks like he was previously on amiodarone, but taken off July 2020 due to some side effects, instability/falls. Not on any other rate/rhythm control medications.   Past Medical Hx Past Medical History:  Diagnosis Date  . Aortic atherosclerosis ([place])   . Arthritis   . BPH (benign prostatic hypertrophy)   . Coronary artery disease    a. Cath 12/2013->[facility]->Med rx.  . Deviated nasal septum   . Diabetes mellitus without complication ([place])    type 2  . Essential hypertension   . GERD (gastroesophageal reflux disease)   . Hearing deficit    wears hearing aids  . Hyperlipidemia   . 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 PAF (paroxysmal atrial fibrillation) ([place])    a. CHA2DS2VASc = 7-->eliquis;  b. 03/2015 Echo: EF 55-60%, Gr1 DD, mild MR, mildly dil LA,  PASP 67mmHg.  Marland Kitchen Pneumonia    as a child  . Presence of permanent cardiac pacemaker 08/16/2015  . PVD (peripheral vascular disease) ([place])   . Rosacea   . Stroke ([place])   . Thyroid disease   . Urine frequency     Problem List Patient Active Problem List   Diagnosis Date Noted  . Sinus node dysfunction ([place]) 08/16/2015  . Bradycardia 08/16/2015  . PAF (paroxysmal atrial fibrillation) ([place])   . Coronary artery disease   . Essential hypertension   . Hypertension 03/11/2015  . Type 2 diabetes mellitus ([place]) 03/11/2015  . GERD (gastroesophageal reflux disease) 03/11/2015  . Hyperlipidemia 03/11/2015  . BPH (benign prostatic hyperplasia) 03/11/2015  . Atrial fibrillation with rapid ventricular response ([place]) 03/11/2015  . Atrial fibrillation with RVR ([place]) 03/10/2015  . Lumbar stenosis with neurogenic claudication 01/04/2015    Past Surgical Hx Past Surgical History:  Procedure Laterality Date  . CARDIAC CATHETERIZATION     [facility] 12/24/13: 30% LM , pLAD, ostial CX. 20% mLAD, mRCA. 70% OM2 (small vessel). Med RX.   Marland Kitchen CARDIAC CATHETERIZATION     [facility]  . CARD[facility]IZATION     [facility]  . CATARACT EXTRACTION W/ INTRAOCULAR LENS IMPLANT     right  . COLONOSCOPY    . EP IMPLANTABLE DEVICE N/A 08/16/2015   Procedure: Pacemaker Implant;  Surgeon: Deboraha Sprang, MD;  Location: [place] CV LAB;  Service: Cardiovascular;  Laterality: N/A;  . LUMBAR LAMINECTOMY/DECOMPRESSION MICRODISCECTOMY N/A 01/04/2015   Procedure: LUMBAR LAMINECTOMY/DECOMPRESSION MICRODISCECTOMY 3 LEVELS;  Surgeon: Newman Pies, MD;  Location: [facility] NEURO ORS;  Service: Neurosurgery;  Laterality: N/A;  L23 L34 L45 laminectomies and foraminotomies  . MULTIPLE TOOTH EXTRACTIONS    . PROSTATE BIOPSY      Medications Prior to Admission medications   Medication Sig Start Date End Date Taking? Authorizing Provider  apixaban (ELIQUIS) 5 MG TABS tablet Take 1 tablet (5 mg total) by mouth 2 (two) times daily. 06/02/19    Deboraha Sprang, MD  doxycycline (VIBRAMYCIN) 100 MG capsule Take 100 mg by mouth 2 (two) times daily.     [provider]  glipiZIDE (GLUCOTROL XL) 5 MG 24 hr tablet Take by mouth. 01/13/19 01/13/20  [provider]  levothyroxine (SYNTHROID, LEVOTHROID) 112 MCG tablet Take 112 mcg by mouth daily before breakfast.    [provider]  lovastatin (MEVACOR) 40 MG tablet Take 40 mg by mouth at bedtime.     [provider]  memantine (NAMENDA) 5 MG tablet Take 5 mg by mouth 2 (two) times daily.  02/19/18   [provider]  metFORMIN (GLUCOPHAGE-XR) 500 MG 24 hr tablet Take 2 tablets by mouth daily. 01/29/18   [provider]  metronidazole (NORITATE) 1 % cream Apply 1 application topically daily.    [provider]  Multiple Vitamin (MULTIVITAMIN) tablet Take 1 tablet by mouth daily.    [provider]  sertraline (ZOLOFT) 25 MG tablet  12/12/18   [provider]  tamsulosin (FLOMAX) 0.4 MG CAPS capsule Take 0.4 mg by mouth 2 (two) times daily.    [provider]    Allergies Patient has no known allergies.  Family Hx Family History  Problem Relation Age of Onset  . Heart attack Mother   . Heart attack Father   . Cancer - Lung Sister   . Cancer - Lung Brother   . Cancer - Lung Brother   . Cancer Brother   . Diabetes Sister     Social Hx Social History   Tobacco Use  . Smoking status: Former Smoker    Types: Cigarettes  . Smokeless tobacco: Never Used  . Tobacco comment: " Quit smoking cigarettes in 1983 "  Substance Use Topics  . Alcohol use: No  . Drug use: No     Review of Systems  Significantly limited due to patient's dementia, but denies any complaints.   Constitutional: Negative for fever, chills. Eyes: Negative for visual changes. ENT: Negative for sore throat. Cardiovascular: Negative for chest pain. Respiratory: Negative for shortness of breath. Gastrointestinal: Negative for  nausea, vomiting.  Genitourinary: Negative for dysuria. Musculoskeletal: Negative for leg swelling. Skin: Negative for rash. Neurological: Negative for headaches.   Physical Exam  Vital Signs: ED Triage Vitals  Enc Vitals Group     BP --      Pulse --      Resp --      Temp 08/08/19 1714 (!) 97.4 F (36.3 C)     Temp Source 08/08/19 1714 Oral     SpO2 --      Weight 08/08/19 1715 175 lb (79.4 kg)     Height 08/08/19 1715 5\' 6"  (1.676 m)     Head Circumference --      Peak Flow --      Pain Score 08/08/19 1714 0     Pain Loc --      Pain Edu? --      Excl. in [place]? --     Constitutional: Alert and  oriented to self, otherwise pleasantly demented.  Head: Normocephalic. Atraumatic. Eyes: Conjunctivae clear. Sclera anicteric. Nose: No congestion. No rhinorrhea. Mouth/Throat: Wearing mask.  Neck: No stridor.   Cardiovascular: Tachycardic, irregular. Extremities well perfused. Respiratory: Normal respiratory effort.  Lungs CTAB. Gastrointestinal: Soft. Non-tender. Non-distended.  Musculoskeletal: No lower extremity edema. No deformities. Neurologic:  Normal speech and language. No gross focal neurologic deficits are appreciated.  Skin: Skin is warm, dry and intact. No rash noted. Psychiatric: Mood and affect are appropriate for situation.  EKG  Personally reviewed.   Rate: 130s Rhythm: AF Axis: normal Intervals: WNL AF with RVR with likely rate related non-specific ST changes lateral precordial leads No STEMI    Radiology  CXR: IMPRESSION:  No active disease. Low lung volumes.    Procedures  Procedure(s) performed (including critical care):  .Critical Care Performed by: Lilia Pro., MD Authorized by: Lilia Pro., MD   Critical care provider statement:    Critical care time (minutes):  64   Critical care was time spent personally by me on the following activities:  Discussions with consultants, evaluation of patient's response to treatment,  examination of patient, ordering and performing treatments and interventions, ordering and review of laboratory studies, ordering and review of radiographic studies, pulse oximetry, re-evaluation of patient's condition, obtaining history from patient or surrogate and review of old charts     Initial Impression / Assessment and Plan / ED Course  84 y.o. male who presents to the ED for AF w/ RVR.   Will obtain basic labs to evaluate electrolytes, and other underlying precipitants as potential etiology for his RVR.  Will give fluids pending CXR (r/o any fluid overload, though clinically this does not appear so) and attempt rate control.  Unfortunately despite IV diltiazem x 2, patient still w/ recurrence of RVR. Will initiate on a drip and plan to admit.  Labs notable for mildly decreased magnesium 1.6, will replete.  Otherwise, remainder electrolytes w/o actionable derangements.  Free T4 noted to be minimally elevated 1.55.  CXR clear.  Discussed with hospitalist for admission.  Discussed plan of care with patient and wife at bedside who are in agreement.   Final Clinical Impression(s) / ED Diagnosis  Final diagnoses:  Atrial fibrillation with RVR ([place])       Note:  This document was prepared using Dragon voice recognition software and may include unintentional dictation errors.   Lilia Pro., MD 08/08/19 [phone]

## 2019-08-08 NOTE — Progress Notes (Signed)
Wife needed at bedside d/t pt's confusion

## 2019-08-09 ENCOUNTER — Inpatient Hospital Stay
Admit: 2019-08-09 | Discharge: 2019-08-09 | Disposition: A | Discharge status: Home / Self Care | Payer: PPO | Attending: Cardiology | Admitting: Cardiology

## 2019-08-09 DIAGNOSIS — I480 Paroxysmal atrial fibrillation: Principal | ICD-10-CM

## 2019-08-09 DIAGNOSIS — F0391 Unspecified dementia with behavioral disturbance: Secondary | ICD-10-CM | POA: Diagnosis present

## 2019-08-09 DIAGNOSIS — F0390 Unspecified dementia without behavioral disturbance: Secondary | ICD-10-CM

## 2019-08-09 DIAGNOSIS — F03918 Unspecified dementia, unspecified severity, with other behavioral disturbance: Secondary | ICD-10-CM | POA: Diagnosis present

## 2019-08-09 LAB — URINALYSIS, COMPLETE (UACMP) WITH MICROSCOPIC
Bacteria, UA: NONE SEEN
Bilirubin Urine: NEGATIVE
Glucose, UA: >=500 mg/dL — AB
Hgb urine dipstick: NEGATIVE
Ketones, ur: NEGATIVE mg/dL
Leukocytes,Ua: NEGATIVE
Nitrite: NEGATIVE
Protein, ur: NEGATIVE mg/dL
Specific Gravity, Urine: 1.017 (ref 1.005–1.030)
Squamous Epithelial / HPF: NONE SEEN (ref 0–5)
pH: 5 (ref 5.0–8.0)

## 2019-08-09 LAB — CBC
HCT: 38.6 % — ABNORMAL LOW (ref 39.0–52.0)
Hemoglobin: 13.5 g/dL (ref 13.0–17.0)
MCH: 30.1 pg (ref 26.0–34.0)
MCHC: 35 g/dL (ref 30.0–36.0)
MCV: 86.2 fL (ref 80.0–100.0)
Platelets: 165 10*3/uL (ref 150–400)
RBC: 4.48 MIL/uL (ref 4.22–5.81)
RDW: 12.4 % (ref 11.5–15.5)
WBC: 7.4 10*3/uL (ref 4.0–10.5)
nRBC: 0 % (ref 0.0–0.2)

## 2019-08-09 LAB — LIPID PANEL
Cholesterol: 120 mg/dL (ref 0–200)
HDL: 34 mg/dL — ABNORMAL LOW (ref >40)
LDL Cholesterol: 62 mg/dL (ref 0–99)
Total CHOL/HDL Ratio: 3.5 RATIO
Triglycerides: 119 mg/dL (ref <150)
VLDL: 24 mg/dL (ref 0–40)

## 2019-08-09 LAB — BASIC METABOLIC PANEL
Anion gap: 6 (ref 5–15)
BUN: 17 mg/dL (ref 8–23)
CO2: 25 mmol/L (ref 22–32)
Calcium: 9.2 mg/dL (ref 8.9–10.3)
Chloride: 109 mmol/L (ref 98–111)
Creatinine, Ser: 0.67 mg/dL (ref 0.61–1.24)
GFR calc Af Amer: >60 mL/min (ref >60)
GFR calc non Af Amer: >60 mL/min (ref >60)
Glucose, Bld: 207 mg/dL — ABNORMAL HIGH (ref 70–99)
Potassium: 4.1 mmol/L (ref 3.5–5.1)
Sodium: 140 mmol/L (ref 135–145)

## 2019-08-09 LAB — GLUCOSE, CAPILLARY
Glucose-Capillary: 148 mg/dL — ABNORMAL HIGH (ref 70–99)
Glucose-Capillary: 213 mg/dL — ABNORMAL HIGH (ref 70–99)
Glucose-Capillary: 123 mg/dL — ABNORMAL HIGH (ref 70–99)
Glucose-Capillary: 141 mg/dL — ABNORMAL HIGH (ref 70–99)

## 2019-08-09 LAB — SARS CORONAVIRUS 2 (TAT 6-24 HRS): SARS Coronavirus 2: NEGATIVE

## 2019-08-09 LAB — MAGNESIUM: Magnesium: 2.2 mg/dL (ref 1.7–2.4)

## 2019-08-09 LAB — ECHOCARDIOGRAM COMPLETE
Height: 67 in
Weight: 2664 oz

## 2019-08-09 LAB — TROPONIN I (HIGH SENSITIVITY)
Troponin I (High Sensitivity): 10 ng/L (ref <18)
Troponin I (High Sensitivity): 9 ng/L (ref <18)

## 2019-08-09 LAB — HEMOGLOBIN A1C
Hgb A1c MFr Bld: 8.3 % — ABNORMAL HIGH (ref 4.8–5.6)
Mean Plasma Glucose: 191.51 mg/dL

## 2019-08-09 MED ORDER — OLANZAPINE 5 MG PO TABS: 5.0000 mg | ORAL_TABLET | Freq: Two times a day (BID) | ORAL | Status: DC | PRN

## 2019-08-09 MED ORDER — DILTIAZEM HCL ER COATED BEADS 120 MG PO CP24
120.0000 mg | ORAL_CAPSULE | Freq: Every day | ORAL | Status: DC
  Administered 2019-08-09 – 2019-08-10 (×2): 120 mg via ORAL
  Filled 2019-08-09 (×2): qty 1

## 2019-08-09 MED ORDER — HALOPERIDOL 2 MG PO TABS
2.0000 mg | ORAL_TABLET | Freq: Two times a day (BID) | ORAL | Status: DC | PRN
  Filled 2019-08-09: qty 1

## 2019-08-09 MED ORDER — INSULIN ASPART 100 UNIT/ML ~~LOC~~ SOLN: 0.0000 [IU] | Freq: Every day | SUBCUTANEOUS | Status: DC

## 2019-08-09 MED ORDER — HALOPERIDOL LACTATE 5 MG/ML IJ SOLN
2.0000 mg | Freq: Two times a day (BID) | INTRAMUSCULAR | Status: DC | PRN
  Administered 2019-08-09 – 2019-08-10 (×2): 2 mg via INTRAMUSCULAR
  Filled 2019-08-09 (×2): qty 1

## 2019-08-09 MED ORDER — OLANZAPINE 10 MG IM SOLR: 5.0000 mg | Freq: Two times a day (BID) | INTRAMUSCULAR | Status: DC | PRN

## 2019-08-09 MED ORDER — INSULIN ASPART 100 UNIT/ML ~~LOC~~ SOLN
0.0000 [IU] | Freq: Three times a day (TID) | SUBCUTANEOUS | Status: DC
  Administered 2019-08-09: 2 [IU] via SUBCUTANEOUS
  Administered 2019-08-09: 5 [IU] via SUBCUTANEOUS
  Administered 2019-08-10: 3 [IU] via SUBCUTANEOUS
  Administered 2019-08-10: 2 [IU] via SUBCUTANEOUS
  Filled 2019-08-09 (×4): qty 1

## 2019-08-09 NOTE — Plan of Care (Signed)
  Problem: Education: Goal: Knowledge of General Education information will improve Description: Including pain rating scale, medication(s)/side effects and non-pharmacologic comfort measures Outcome: Progressing Note: Patient profile completed. Wife is at the bedside. Patient is very confused and uncooperative. No skin issues.

## 2019-08-09 NOTE — Consult Note (Signed)
[facility] Face-to-Face Psychiatry Consult   Reason for Consult:  Agitation  Referring Physician:  Dr Mitchell Heir Patient Identification: Ernest [name] MRN:  [id number] Principal Diagnosis: Cardiac issues Diagnosis:  Active Problems:   Dementia with behavioral disturbance ([place])   Atrial fibrillation with RVR ([place])  Total Time spent with patient: 1 hour  Subjective:   Ernest [name] is a 84 y.o. male patient admitted with cardiac issues and history of dementia, consult for agitation.  Patient seen and evaluated in person by this provider.  He was on his bed rearranging his blankets and reports he is much better now that they turned down the heat.  Then he begins to ramble about a red pole and other things.  Pleasant but confused.  His RN reported that he is typically fine during the day and that his wife had just left.  Evidently though at night he is experiencing sundowning becomes more agitated at nighttime.  Last night he required hand mitts and as needed medications.  Medications reviewed and treatment plan below.  HPI per MD:  84 y.o. male with a known history of multiple problems documented below, including dementia with behavioral changes, hypertension, dyslipidemia, coronary artery disease, and paroxysmal atrial fibrillation on Eliquis, who presented to the emergency room with acute onset of palpitations and associated chest pain.  He has been having bouts of atrial fibrillation up to 102-103 per his wife who gives all the history due to his restlessness.  This afternoon she states that she he had a spell where he was not feeling good and was not acting right.  He was fairly confused.  He had urinary incontinence.  He did not have any reported fever or chills nausea or vomiting or abdominal pain.  No dysuria, oliguria or hematuria or flank pain.  He has been having visual and auditory hallucinations with his dementia lately.  His amiodarone was recently discontinued by his cardiologist.  During my  interview the patient was trying to remove his IVs and was fairly agitated and restless.  Past Psychiatric History: dementia  Risk to Self:  none Risk to Others:  none Prior Inpatient Therapy:  none Prior Outpatient Therapy:  none  Past Medical History:  Past Medical History:  Diagnosis Date  . Aortic atherosclerosis ([place])   . Arthritis   . BPH (benign prostatic hypertrophy)   . Coronary artery disease    a. Cath 12/2013->[facility]->Med rx.  . Deviated nasal septum   . Diabetes mellitus without complication ([place])    type 2  . Essential hypertension   . GERD (gastroesophageal reflux disease)   . Hearing deficit    wears hearing aids  . Hyperlipidemia   . 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 PAF (paroxysmal atrial fibrillation) ([place])    a. CHA2DS2VASc = 7-->eliquis;  b. 03/2015 Echo: EF 55-60%, Gr1 DD, mild MR, mildly dil LA, PASP 17mmHg.  Marland Kitchen Pneumonia    as a child  . Presence of permanent cardiac pacemaker 08/16/2015  . PVD (peripheral vascular disease) ([place])   . Rosacea   . Stroke ([place])   . Thyroid disease   . Urine frequency     Past Surgical History:  Procedure Laterality Date  . CARDIAC CATHETERIZATION     [facility] 12/24/13: 30% LM , pLAD, ostial CX. 20% mLAD, mRCA. 70% OM2 (small vessel). Med RX.   Marland Kitchen CARDIAC CATHETERIZATION     [facility]  . CARD[facility]AC CATHETERIZATION     [facility]  . CATARACT  EXTRACTION W/ INTRAOCULAR LENS IMPLANT     right  . COLONOSCOPY    . EP IMPLANTABLE DEVICE N/A 08/16/2015   Procedure: Pacemaker Implant;  Surgeon: Deboraha Sprang, MD;  Location: [facility];  Service: Cardiovascular;  Laterality: N/A;  . LUMBAR LAMINECTOMY/DECOMPRESSION MICRODISCECTOMY N/A 01/04/2015   Procedure: LUMBAR LAMINECTOMY/DECOMPRESSION MICRODISCECTOMY 3 LEVELS;  Surgeon: Newman Pies, MD;  Location: [facility] OR[facility];  Service: Neurosurgery;  Laterality: N/A;  L23 L34 L45 laminectomies and foraminotomies  . MULTIPLE TOOTH EXTRACTIONS    . PROSTATE BIOPSY     Family History:   Family History  Problem Relation Age of Onset  . Heart attack Mother   . Heart attack Father   . Cancer - Lung Sister   . Cancer - Lung Brother   . Cancer - Lung Brother   . Cancer Brother   . Diabetes Sister    Family Psychiatric  History: none Social History:  Social History   Substance and Sexual Activity  Alcohol Use No     Social History   Substance and Sexual Activity  Drug Use No    Social History   Socioeconomic History  . Marital status: Married    Spouse name: Not on file  . Number of children: Not on file  . Years of education: Not on file  . Highest education level: Not on file  Occupational History  . Not on file  Tobacco Use  . Smoking status: Former Smoker    Types: Cigarettes  . Smokeless tobacco: Never Used  . Tobacco comment: " Quit smoking cigarettes in 1983 "  Substance and Sexual Activity  . Alcohol use: No  . Drug use: No  . Sexual activity: Not on file  Other Topics Concern  . Not on file  Social History Narrative  . Not on file   Social Determinants of Health   Financial Resource Strain:   . Difficulty of Paying Living Expenses: Not on file  Food Insecurity:   . Worried About Charity fundraiser in the Last Year: Not on file  . Ran Out of Food in the Last Year: Not on file  Transportation Needs:   . Lack of Transportation (Medical): Not on file  . Lack of Transportation (Non-Medical): Not on file  Physical Activity:   . Days of Exercise per Week: Not on file  . Minutes of Exercise per Session: Not on file  Stress:   . Feeling of Stress : Not on file  Social Connections:   . Frequency of Communication with Friends and Family: Not on file  . Frequency of Social Gatherings with Friends and Family: Not on file  . Attends Religious Services: Not on file  . Active Member of Clubs or Organizations: Not on file  . Attends Archivist Meetings: Not on file  . Marital Status: Not on file   Additional Social History:     Allergies:  No Known Allergies  Labs:  Results for orders placed or performed during the hospital encounter of 08/08/19 (from the past 48 hour(s))  CBC with Differential     Status: None   Collection Time: 08/08/19  5:13 PM  Result Value Ref Range   WBC 7.9 4.0 - 10.5 K/uL   RBC 4.98 4.22 - 5.81 MIL/uL   Hemoglobin 14.9 13.0 - 17.0 g/dL   HCT 42.9 39.0 - 52.0 %   MCV 86.1 80.0 - 100.0 fL   MCH 29.9 26.0 - 34.0 pg  MCHC 34.7 30.0 - 36.0 g/dL   RDW 12.5 11.5 - 15.5 %   Platelets 170 150 - 400 K/uL   nRBC 0.0 0.0 - 0.2 %   Neutrophils Relative % 63 %   Neutro Abs 5.0 1.7 - 7.7 K/uL   Lymphocytes Relative 25 %   Lymphs Abs 1.9 0.7 - 4.0 K/uL   Monocytes Relative 9 %   Monocytes Absolute 0.7 0.1 - 1.0 K/uL   Eosinophils Relative 2 %   Eosinophils Absolute 0.1 0.0 - 0.5 K/uL   Basophils Relative 1 %   Basophils Absolute 0.1 0.0 - 0.1 K/uL   Immature Granulocytes 0 %   Abs Immature Granulocytes 0.02 0.00 - 0.07 K/uL    Comment: Performed at [facility], [address] [place], [place] [address]  Comprehensive metabolic panel     Status: Abnormal   Collection Time: 08/08/19  5:13 PM  Result Value Ref Range   Sodium 136 135 - 145 mmol/L   Potassium 3.9 3.5 - 5.1 mmol/L   Chloride 104 98 - 111 mmol/L   CO2 24 22 - 32 mmol/L   Glucose, Bld 311 (H) 70 - 99 mg/dL   BUN 17 8 - 23 mg/dL   Creatinine, Ser 0.72 0.61 - 1.24 mg/dL   Calcium 9.6 8.9 - 10.3 mg/dL   Total Protein 6.4 (L) 6.5 - 8.1 g/dL   Albumin 3.7 3.5 - 5.0 g/dL   AST 18 15 - 41 U/L   ALT 23 0 - 44 U/L   Alkaline Phosphatase 75 38 - 126 U/L   Total Bilirubin 0.8 0.3 - 1.2 mg/dL   GFR calc non Af Amer >60 >60 mL/min   GFR calc Af Amer >60 >60 mL/min   Anion gap 8 5 - 15    Comment: Performed at [facility], [address] [place], [place] [address]  Magnesium     Status: Abnormal   Collection Time: 08/08/19  5:13 PM  Result Value Ref Range   Magnesium 1.6 (L) 1.7 - 2.4 mg/dL    Comment:  Performed at [facility], [address] [place], [place] [address]  TSH     Status: None   Collection Time: 08/08/19  5:13 PM  Result Value Ref Range   TSH 0.355 0.350 - 4.500 uIU/mL    Comment: Performed by a 3rd Generation assay with a functional sensitivity of <=0.01 uIU/mL. Performed at [facility], [address] [place], [place] [address]   T4, free     Status: Abnormal   Collection Time: 08/08/19  5:13 PM  Result Value Ref Range   Free T4 1.55 (H) 0.61 - 1.12 ng/dL    Comment: (NOTE) Biotin ingestion may interfere with free T4 tests. If the results are inconsistent with the TSH level, previous test results, or the clinical presentation, then consider biotin interference. If needed, order repeat testing after stopping biotin. Performed at [facility], [place], [place] [address]   SARS CORONAVIRUS 2 (TAT 6-24 HRS) Nasopharyngeal Nasopharyngeal Swab     Status: None   Collection Time: 08/08/19  6:44 PM   Specimen: Nasopharyngeal Swab  Result Value Ref Range   SARS Coronavirus 2 NEGATIVE NEGATIVE    Comment: (NOTE) SARS-CoV-2 target nucleic acids are NOT DETECTED. The SARS-CoV-2 RNA is generally detectable in upper and lower respiratory specimens during the acute phase of infection. Negative results do not preclude SARS-CoV-2 infection, do not rule out co-infections with other  pathogens, and should not be used as the sole basis for treatment or other patient management decisions. Negative results must be combined with clinical observations, patient history, and epidemiological information. The expected result is Negative. Fact Sheet for Patients: SugarRoll.be Fact Sheet for Healthcare Providers: https://www.woods-mathews.com/ This test is not yet approved or cleared by the [place] FDA and  has been authorized for detection and/or diagnosis of SARS-CoV-2 by FDA under an  Emergency Use Authorization (EUA). This EUA will remain  in effect (meaning this test can be used) for the duration of the COVID-19 declaration under Section 56 4(b)(1) of the Act, 21 U.S.C. section 360bbb-3(b)(1), unless the authorization is terminated or revoked sooner. Performed at [facility], [place] [address] [place], [place] [address]   Troponin I (High Sensitivity)     Status: None   Collection Time: 08/08/19 11:43 PM  Result Value Ref Range   Troponin I (High Sensitivity) 9 <18 ng/L    Comment: (NOTE) Elevated high sensitivity troponin I (hsTnI) values and significant  changes across serial measurements may suggest ACS but many other  chronic and acute conditions are known to elevate hsTnI results.  Refer to the "Links" section for chest pain algorithms and additional  guidance. Performed at [facility], [address] [place], [place] [address]   Urinalysis, Complete w Microscopic     Status: Abnormal   Collection Time: 08/09/19  1:09 AM  Result Value Ref Range   Color, Urine YELLOW (A) YELLOW   APPearance CLEAR (A) CLEAR   Specific Gravity, Urine 1.017 1.005 - 1.030   pH 5.0 5.0 - 8.0   Glucose, UA >=500 (A) NEGATIVE mg/dL   Hgb urine dipstick NEGATIVE NEGATIVE   Bilirubin Urine NEGATIVE NEGATIVE   Ketones, ur NEGATIVE NEGATIVE mg/dL   Protein, ur NEGATIVE NEGATIVE mg/dL   Nitrite NEGATIVE NEGATIVE   Leukocytes,Ua NEGATIVE NEGATIVE   RBC / HPF 0-5 0 - 5 RBC/hpf   WBC, UA 0-5 0 - 5 WBC/hpf   Bacteria, UA NONE SEEN NONE SEEN   Squamous Epithelial / LPF NONE SEEN 0 - 5   Mucus PRESENT     Comment: Performed at [facility], [address] [place], [place] [id number]  Basic metabolic panel     Status: Abnormal   Collection Time: 08/09/19  1:12 AM  Result Value Ref Range   Sodium 140 135 - 145 mmol/L   Potassium 4.1 3.5 - 5.1 mmol/L   Chloride 109 98 - 111 mmol/L   CO2 25 22 - 32 mmol/L   Glucose, Bld 207 (H) 70 - 99 mg/dL   BUN 17 8 -  23 mg/dL   Creatinine, Ser 0.67 0.61 - 1.24 mg/dL   Calcium 9.2 8.9 - 10.3 mg/dL   GFR calc non Af Amer >60 >60 mL/min   GFR calc Af Amer >60 >60 mL/min   Anion gap 6 5 - 15    Comment: Performed at [facility], [address] [place], [place] [address]  CBC     Status: Abnormal   Collection Time: 08/09/19  1:12 AM  Result Value Ref Range   WBC 7.4 4.0 - 10.5 K/uL   RBC 4.48 4.22 - 5.81 MIL/uL   Hemoglobin 13.5 13.0 - 17.0 g/dL   HCT 38.6 (L) 39.0 - 52.0 %   MCV 86.2 80.0 - 100.0 fL   MCH 30.1 26.0 - 34.0 pg   MCHC 35.0 30.0 - 36.0 g/dL   RDW 12.4  11.5 - 15.5 %   Platelets 165 150 - 400 K/uL   nRBC 0.0 0.0 - 0.2 %    Comment: Performed at [facility], [address] [place], [place] [address]  Lipid panel     Status: Abnormal   Collection Time: 08/09/19  1:12 AM  Result Value Ref Range   Cholesterol 120 0 - 200 mg/dL   Triglycerides 119 <150 mg/dL   HDL 34 (L) >40 mg/dL   Total CHOL/HDL Ratio 3.5 RATIO   VLDL 24 0 - 40 mg/dL   LDL Cholesterol 62 0 - 99 mg/dL    Comment:        Total Cholesterol/HDL:CHD Risk Coronary Heart Disease Risk Table                     Men   Women  1/2 Average Risk   3.4   3.3  Average Risk       5.0   4.4  2 X Average Risk   9.6   7.1  3 X Average Risk  23.4   11.0        Use the calculated Patient Ratio above and the CHD Risk Table to determine the patient's CHD Risk.        ATP III CLASSIFICATION (LDL):  <100     mg/dL   Optimal  100-129  mg/dL   Near or Above                    Optimal  130-159  mg/dL   Borderline  160-189  mg/dL   High  >190     mg/dL   Very High Performed at [facility], [address] [place], [place] [address]   Magnesium     Status: None   Collection Time: 08/09/19  1:12 AM  Result Value Ref Range   Magnesium 2.2 1.7 - 2.4 mg/dL    Comment: Performed at [facility], [place], [place] [address]  Troponin I (High Sensitivity)     Status: None    Collection Time: 08/09/19  1:12 AM  Result Value Ref Range   Troponin I (High Sensitivity) 10 <18 ng/L    Comment: (NOTE) Elevated high sensitivity troponin I (hsTnI) values and significant  changes across serial measurements may suggest ACS but many other  chronic and acute conditions are known to elevate hsTnI results.  Refer to the "Links" section for chest pain algorithms and additional  guidance. Performed at [facility], [address] [place], [place] [address]   Hemoglobin A1c     Status: Abnormal   Collection Time: 08/09/19  1:12 AM  Result Value Ref Range   Hgb A1c MFr Bld 8.3 (H) 4.8 - 5.6 %    Comment: (NOTE) Pre diabetes:          5.7%-6.4% Diabetes:              >6.4% Glycemic control for   <7.0% adults with diabetes    Mean Plasma Glucose 191.51 mg/dL    Comment: Performed at [facility] [address], [place], [place] [address]  Glucose, capillary     Status: Abnormal   Collection Time: 08/09/19  9:15 AM  Result Value Ref Range   Glucose-Capillary 148 (H) 70 - 99 mg/dL  Glucose, capillary     Status: Abnormal   Collection Time: 08/09/19 12:07 PM  Result Value Ref Range   Glucose-Capillary 213 (  H) 70 - 99 mg/dL    Current Facility-Administered Medications  Medication Dose Route Frequency Provider Last Rate Last Admin  . 0.9 %  sodium chloride infusion   Intravenous Continuous Mansy, Jan A, MD 100 mL/hr at 08/09/19 1023 New Bag at 08/09/19 1023  . acetaminophen (TYLENOL) tablet 650 mg  650 mg Oral Q4H PRN Mansy, Jan A, MD      . ALPRAZolam Duanne Moron) tablet 0.25 mg  0.25 mg Oral BID PRN Mansy, Jan A, MD   0.25 mg at 08/08/19 2152  . apixaban (ELIQUIS) tablet 5 mg  5 mg Oral BID Mansy, Jan A, MD   5 mg at 08/09/19 1024  . diltiazem (CARDIZEM CD) 24 hr capsule 120 mg  120 mg Oral Daily Kate Sable, MD   120 mg at 08/09/19 1048  . diphenhydrAMINE (BENADRYL) injection 25 mg  25 mg Intravenous QHS PRN Mansy, Jan A, MD   25 mg at 08/09/19 0112   . haloperidol lactate (HALDOL) injection 1 mg  1 mg Intramuscular Q6H PRN Mansy, Jan A, MD   1 mg at 08/08/19 2328  . insulin aspart (novoLOG) injection 0-15 Units  0-15 Units Subcutaneous TID WC Ralene Muskrat B, MD   5 Units at 08/09/19 1324  . insulin aspart (novoLOG) injection 0-5 Units  0-5 Units Subcutaneous QHS Sreenath, Sudheer B, MD      . levothyroxine (SYNTHROID) tablet 100 mcg  100 mcg Oral QAC breakfast Mansy, Jan A, MD   100 mcg at 08/09/19 1027  . LORazepam (ATIVAN) injection 0.5 mg  0.5 mg Intravenous Q4H PRN Mansy, Jan A, MD   0.5 mg at 08/09/19 0203  . memantine (NAMENDA) tablet 5 mg  5 mg Oral BID Mansy, Jan A, MD   5 mg at 08/09/19 1023  . multivitamin with minerals tablet 1 tablet  1 tablet Oral Daily Mansy, Jan A, MD   1 tablet at 08/09/19 1024  . OLANZapine zydis (ZYPREXA) disintegrating tablet 2.5 mg  2.5 mg Oral Q6H PRN Mansy, Jan A, MD      . ondansetron [facility]) injection 4 mg  4 mg Intravenous Q6H PRN Mansy, Jan A, MD      . sertraline (ZOLOFT) tablet 25 mg  25 mg Oral Daily Mansy, Jan A, MD   25 mg at 08/09/19 1024  . tamsulosin (FLOMAX) capsule 0.4 mg  0.4 mg Oral BID Mansy, Jan A, MD   0.4 mg at 08/09/19 1024    Musculoskeletal: Strength & Muscle Tone: within normal limits Gait & Station: did not witness Patient leans: N/A  Psychiatric Specialty Exam: Physical Exam  Nursing note and vitals reviewed. Constitutional: He appears well-developed and well-nourished.  HENT:  Head: Normocephalic.  Respiratory: Effort normal.  Musculoskeletal:     Cervical back: Normal range of motion.  Neurological: He is alert.  Psychiatric: His speech is normal and behavior is normal. Thought content normal. His mood appears anxious. Cognition and memory are impaired. He expresses impulsivity.    Review of Systems  Psychiatric/Behavioral: Positive for behavioral problems and confusion.  All other systems reviewed and are negative.   Blood pressure (!) 144/68, pulse 79,  temperature 98 F (36.7 C), resp. rate 17, height 5\' 7"  (1.702 m), weight 75.5 kg, SpO2 99 %.Body mass index is 26.08 kg/m.  General Appearance: Casual  Eye Contact:  Fair  Speech:  Normal Rate  Volume:  Normal  Mood:  Anxious  Affect:  Congruent  Thought Process:  Irrelevant  Orientation:  Other:  person  Thought Content:  confused  Suicidal Thoughts:  No threat to self  Homicidal Thoughts:  No threat to others  Memory:  Immediate;   Poor Recent;   Poor Remote;   Poor  Judgement:  Impaired  Insight:  Lacking  Psychomotor Activity:  Normal  Concentration:  Concentration: Poor and Attention Span: Poor  Recall:  Poor  Fund of Knowledge:  Fair  Language:  Fair  Akathisia:  No  Handed:  Right  AIMS (if indicated):     Assets:  Housing Leisure Time Resilience Social Support  ADL's:  Impaired  Cognition:  Impaired,  Moderate  Sleep:      84 year old male admitted for cardiac issues with a history of dementia and behavioral disturbances.  Evidently he is agitated at night and wife reports hallucinations.  No current threat to self or others medications reviewed and treatment plan below.  Treatment Plan Summary: Dementia with behavioral disturbances: -Continue Namenda 5 mg BID -Discontinued Xanax -Continued Ativan 0.5 mg every 4 hours PRN agtiation -Started Haldol oral or IM 2 mg BID PRN agitation, discontinued IV r/t cardiac issues  Depression and anxiety: -Continue Zoloft 25 mg daily  Insomnia: -Continue Benadryl 25 mg IV at bedtime  Disposition: No evidence of imminent risk to self or others at present.    Waylan Boga, NP 08/09/2019 4:03 PM

## 2019-08-09 NOTE — Consult Note (Signed)
Cardiology Consultation:   Patient ID: Ernest [name] MRN: [id number]; DOB: June 27, 1936  Admit date: 08/08/2019 Date of Consult: 08/09/2019  Primary Care Provider: Derinda Late, MD Primary Cardiologist:Dr. Caryl Comes Primary Electrophysiologist: Dr. Caryl Comes   Patient Profile:   Ernest [name] is a 84 y.o. male with a hx of dementia, paroxysmal A. fib, hypertension who is being seen today for the evaluation of atrial fibrillation with RVR at the request of Dr. Sidney Ace  History of Present Illness:  Due to condition of patient, most of history obtained from wife at bedside.  Ernest [name] is an 84 year old male with history of dementia, paroxysmal atrial fibrillation, hypertension, prior CVA, sick sinus syndrome status post permanent pacemaker who presents with altered mental status and increased heart rate.  Patient was at home yesterday when his wife found him in the shower with his clothes on.  She tried to check his pulse and oxygen saturation with a portable pulse ox.  Oxygen was normal, his heart rate was slightly elevated to 105.  She had difficulty moving him around.  She also states patient had urine incontinence.  She called EMS, upon EMS arrival, patient's heart rate was noted to be in the 180s.  In the emergency room, EKG showed atrial fibrillation with RVR, heart rate 137.  Patient was given IV Cardizem with conversion to sinus rhythm and improvement in heart rates.  He was then admitted to telemetry for observation.  Heart Pathway Score:     Past Medical History:  Diagnosis Date  . Aortic atherosclerosis ([place])   . Arthritis   . BPH (benign prostatic hypertrophy)   . Coronary artery disease    a. Cath 12/2013->[facility]->Med rx.  . Deviated nasal septum   . Diabetes mellitus without complication ([place])    type 2  . Essential hypertension   . GERD (gastroesophageal reflux disease)   . Hearing deficit    wears hearing aids  . Hyperlipidemia   . Lupus (HCC)    discoid of scalp  only  . PAF (paroxysmal atrial fibrillation) ([place])    a. CHA2DS2VASc = 7-->eliquis;  b. 03/2015 Echo: EF 55-60%, Gr1 DD, mild MR, mildly dil LA, PASP 59mmHg.  Marland Kitchen Pneumonia    as a child  . Presence of permanent cardiac pacemaker 08/16/2015  . PVD (peripheral vascular disease) ([place])   . Rosacea   . Stroke ([place])   . Thyroid disease   . Urine frequency     Past Surgical History:  Procedure Laterality Date  . CARDIAC CATHETERIZATION     [facility] 12/24/13: 30% LM , pLAD, ostial CX. 20% mLAD, mRCA. 70% OM2 (small vessel). Med RX.   Marland Kitchen CARDIAC CATHETERIZATION     [facility]  . CARDIAC CATHETERIZATION     [facility]  . CATARACT EXTRACTION W/ INTRAOCULAR LENS IMPLANT     right  . COLONOSCOPY    . EP IMPLANTABLE DEVICE N/A 08/16/2015   Procedure: Pacemaker Implant;  Surgeon: Deboraha Sprang, MD;  Location: [facility];  Service: Cardiovascular;  Laterality: N/A;  . LUMBAR LAMINECTOMY/DECOMPRESSION MICRODISCECTOMY N/A 01/04/2015   Procedure: LUMBAR LAMINECTOMY/DECOMPRESSION MICRODISCECTOMY 3 LEVELS;  Surgeon: Newman Pies, MD;  Location: [facility];  Service: Neurosurgery;  Laterality: N/A;  L23 L34 L45 laminectomies and foraminotomies  . MULTIPLE TOOTH EXTRACTIONS    . PROSTATE BIOPSY       Home Medications:  Prior to Admission medications   Medication Sig Start Date End Date Taking? Authorizing Provider  apixaban (ELIQUIS) 5 MG TABS tablet  Take 1 tablet (5 mg total) by mouth 2 (two) times daily. 06/02/19  Yes Deboraha Sprang, MD  glipiZIDE (GLUCOTROL XL) 5 MG 24 hr tablet Take 5 mg by mouth daily.  01/13/19 01/13/20 Yes [provider]  levothyroxine (SYNTHROID) 125 MCG tablet Take 125 mcg by mouth daily before breakfast.    Yes [provider]  lovastatin (MEVACOR) 40 MG tablet Take 40 mg by mouth at bedtime.    Yes [provider]  memantine (NAMENDA) 5 MG tablet Take 5 mg by mouth 2 (two) times daily.  02/19/18  Yes [provider]  metFORMIN (GLUCOPHAGE-XR) 500  MG 24 hr tablet Take 1,000 mg by mouth daily.   Yes [provider]  metronidazole (NORITATE) 1 % cream Apply 1 application topically daily.   Yes [provider]  Multiple Vitamin (MULTIVITAMIN) tablet Take 1 tablet by mouth daily.   Yes [provider]  sertraline (ZOLOFT) 25 MG tablet Take 25 mg by mouth daily.  12/12/18  Yes [provider]  tamsulosin (FLOMAX) 0.4 MG CAPS capsule Take 0.4 mg by mouth 2 (two) times daily.   Yes [provider]    Inpatient Medications: Scheduled Meds: . apixaban  5 mg Oral BID  . diltiazem  120 mg Oral Daily  . insulin aspart  0-15 Units Subcutaneous TID WC  . insulin aspart  0-5 Units Subcutaneous QHS  . levothyroxine  100 mcg Oral QAC breakfast  . memantine  5 mg Oral BID  . multivitamin with minerals  1 tablet Oral Daily  . sertraline  25 mg Oral Daily  . tamsulosin  0.4 mg Oral BID   Continuous Infusions: . sodium chloride 100 mL/hr at 08/09/19 1023   PRN Meds: acetaminophen, ALPRAZolam, diphenhydrAMINE, haloperidol lactate, LORazepam, OLANZapine zydis, ondansetron (ZOFRAN) IV  Allergies:   No Known Allergies  Social History:   Social History   Socioeconomic History  . Marital status: Married    Spouse name: Not on file  . Number of children: Not on file  . Years of education: Not on file  . Highest education level: Not on file  Occupational History  . Not on file  Tobacco Use  . Smoking status: Former Smoker    Types: Cigarettes  . Smokeless tobacco: Never Used  . Tobacco comment: " Quit smoking cigarettes in 1983 "  Substance and Sexual Activity  . Alcohol use: No  . Drug use: No  . Sexual activity: Not on file  Other Topics Concern  . Not on file  Social History Narrative  . Not on file   Social Determinants of Health   Financial Resource Strain:   . Difficulty of Paying Living Expenses: Not on file  Food Insecurity:   . Worried About Charity fundraiser in the Last Year:  Not on file  . Ran Out of Food in the Last Year: Not on file  Transportation Needs:   . Lack of Transportation (Medical): Not on file  . Lack of Transportation (Non-Medical): Not on file  Physical Activity:   . Days of Exercise per Week: Not on file  . Minutes of Exercise per Session: Not on file  Stress:   . Feeling of Stress : Not on file  Social Connections:   . Frequency of Communication with Friends and Family: Not on file  . Frequency of Social Gatherings with Friends and Family: Not on file  . Attends Religious Services: Not on file  . Active Member of  Clubs or Organizations: Not on file  . Attends Archivist Meetings: Not on file  . Marital Status: Not on file  Intimate Partner Violence:   . Fear of Current or Ex-Partner: Not on file  . Emotionally Abused: Not on file  . Physically Abused: Not on file  . Sexually Abused: Not on file    Family History:    Family History  Problem Relation Age of Onset  . Heart attack Mother   . Heart attack Father   . Cancer - Lung Sister   . Cancer - Lung Brother   . Cancer - Lung Brother   . Cancer Brother   . Diabetes Sister      ROS:  Please see the history of present illness.   All other ROS reviewed and negative.     Physical Exam/Data:   Vitals:   08/08/19 2013 08/08/19 2014 08/08/19 2015 08/08/19 2104  BP: (!) 120/56   111/75  Pulse:   65 80  Resp: (!) 24 (!) 21 (!) 22 20  Temp:    98.4 F (36.9 C)  TempSrc:    Oral  SpO2:   92% 100%  Weight:    75.5 kg  Height:    5\' 7"  (1.702 m)    Intake/Output Summary (Last 24 hours) at 08/09/2019 1034 Last data filed at 08/09/2019 0917 Gross per 24 hour  Intake 357.11 ml  Output 650 ml  Net -292.89 ml   Last 3 Weights 08/08/2019 08/08/2019 01/21/2019  Weight (lbs) 166 lb 8 oz 175 lb 172 lb 1.9 oz  Weight (kg) 75.524 kg 79.379 kg 78.073 kg     Body mass index is 26.08 kg/m.  General:  Well nourished, well developed, in no acute distress, lying in bed HEENT:  normal Lymph: no adenopathy Neck: no JVD Endocrine:  No thryomegaly Vascular: No carotid bruits; FA pulses 2+ bilaterally without bruits  Cardiac:  normal S1, S2; RRR; no murmur  Lungs:  clear to auscultation bilaterally, no wheezing, rhonchi or rales  Abd: soft, nontender, no hepatomegaly  Ext: no edema Musculoskeletal:  No deformities, BUE and BLE strength normal and equal Skin: warm and dry  Neuro:  CNs 2-12 intact, no focal abnormalities noted Psych:  Normal affect   EKG:  The EKG was personally reviewed and demonstrates: Atrial fibrillation, heart rate 137 Telemetry:  Telemetry was personally reviewed and demonstrates: Normal sinus rhythm, heart rate 68  Relevant CV Studies: TTE 03/2015 Left ventricle: The cavity size was normal. There was mild  concentric hypertrophy. Systolic function was normal. The  estimated ejection fraction was in the range of 55% to 60%. Wall  motion was normal; there were no regional wall motion  abnormalities. Doppler parameters are consistent with abnormal  left ventricular relaxation (grade 1 diastolic dysfunction).  - Mitral valve: There was mild regurgitation.  - Left atrium: The atrium was mildly dilated.  - Right ventricle: Systolic function was normal.  - Pulmonary arteries: PA peak pressure: 36 mm Hg (S).   Laboratory Data:  High Sensitivity Troponin:   Recent Labs  Lab 08/08/19 2343 08/09/19 0112  TROPONINIHS 9 10     Chemistry Recent Labs  Lab 08/08/19 1713 08/09/19 0112  NA 136 140  K 3.9 4.1  CL 104 109  CO2 24 25  GLUCOSE 311* 207*  BUN 17 17  CREATININE 0.72 0.67  CALCIUM 9.6 9.2  GFRNONAA >60 >60  GFRAA >60 >60  ANIONGAP 8 6    Recent Labs  Lab 08/08/19 1713  PROT 6.4*  ALBUMIN 3.7  AST 18  ALT 23  ALKPHOS 75  BILITOT 0.8   Hematology Recent Labs  Lab 08/08/19 1713 08/09/19 0112  WBC 7.9 7.4  RBC 4.98 4.48  HGB 14.9 13.5  HCT 42.9 38.6*  MCV 86.1 86.2  MCH 29.9 30.1  MCHC 34.7 35.0    RDW 12.5 12.4  PLT 170 165   BNPNo results for input(s): BNP, PROBNP in the last 168 hours.  DDimer No results for input(s): DDIMER in the last 168 hours.   Radiology/Studies:  DG Chest Port 1 View  Result Date: 08/08/2019 CLINICAL DATA:  AFib with rapid ventricular response EXAM: PORTABLE CHEST 1 VIEW COMPARISON:  08/17/2015 FINDINGS: Left-sided pacing device with leads over the right atrium and right ventricle. Low lung volumes. No consolidation, pleural effusion or pneumothorax. Normal heart size. Aortic atherosclerosis. Apical pleural thickening and scarring. No pneumothorax. IMPRESSION: No active disease.  Low lung volumes. Electronically Signed   By: Donavan Foil M.D.   On: 08/08/2019 17:40   {   Assessment and Plan:   1.  Paroxysmal A. fib with RVR -Patient currently in sinus rhythm -Continue Eliquis, Cardizem extended release 120 mg daily -Echocardiogram -Amiodarone was stopped in the past due to toxicity. -If stays in sinus and heart rates controlled on above dose of Cardizem, plan for DC tomorrow.  2.  Dementia -Continue PTA meds -Management as per primary team  Continue chronic PTA meds.   Signed, Kate Sable, MD  08/09/2019 10:34 AM

## 2019-08-09 NOTE — Progress Notes (Signed)
Spouse declined to have vitals taken at this time while patient is sleeping

## 2019-08-09 NOTE — Progress Notes (Signed)
PROGRESS NOTE    Ernest [name]  [id number] DOB: 03/31/1936 DOA: 08/08/2019 PCP: Derinda Late, MD    Brief Narrative:  Ernest [name]  is a 84 y.o. male with a known history of multiple problems documented below, including dementia with behavioral changes, hypertension, dyslipidemia, coronary artery disease, and paroxysmal atrial fibrillation on Eliquis, who presented to the emergency room with acute onset of palpitations and associated chest pain.  He has been having bouts of atrial fibrillation up to 102-103 per his wife who gives all the history due to his restlessness.  This afternoon she states that she he had a spell where he was not feeling good and was not acting right.  He was fairly confused.  He had urinary incontinence.  He did not have any reported fever or chills nausea or vomiting or abdominal pain.  No dysuria, oliguria or hematuria or flank pain.  He has been having visual and auditory hallucinations with his dementia lately.  His amiodarone was recently discontinued by his cardiologist.  During my interview the patient was trying to remove his IVs and was fairly agitated and restless.  Upon presentation to the emergency room, temperature was 97.4 heart rate was 163 with otherwise normal vital signs except for tachypnea later.  Labs are remarkable for magnesium 1.6.EKG showed atrial fibrillation with rapid ventricular response of 137.  COVID-19 PCR is currently pending.  Portable chest ray showed no acute cardiopulmonary disease with low lung volumes.  1/30: Patient seen and examined.  Wife at bedside.  Patient less agitated.  Heart rate controlled.  Cardizem drip discontinued overnight.  Assessment & Plan:   Active Problems:   Atrial fibrillation with RVR (HCC)  Paroxysmal atrial fibrillation with rapid ventricular response  chest pain.   This could be exacerbated by hypomagnesemia.  IV Cardizem GGT initiated Patient converted to paced rhythm Cardiology consult  appreciated Continue Cardizem extended release 120 mg daily Continue Eliquis 5 mg twice daily for VTE prophylaxis Follow-up 2D echocardiogram Anticipate discharge in a.m. if patient stays in sinus and heart rate controlled  Hypothyroidism with mild overcorrection.   TSH was low on free T4 is elevated in the setting of atrial fibrillation with RVR.   cut down his Synthroid dose to 100 MCG daily.  Altered mental status. Delirium superimposed on dementia Delirium agitation improved over interval Continue as needed p.o. Zyprexa Continue as needed IM Haldol Continue as needed IV Ativan Psychiatry contacted by admitting physician, pending formal recommendations Continue Namenda  Type 2 diabetes mellitus without complications Hold oral agents Supplemental NovoLog coverage  Dyslipidemia  continue statin   Depression continue Zoloft.   DVT prophylaxis: Eliquis Code Status: Full Family Communication: Wife at bedside Disposition Plan: Home, anticipate 24 hours if heart rate controlled  Consultants:   Cardiology- [facility]  Procedures:   None  Antimicrobials:  None   Subjective: Seen and examined Agitation improved No complaints  Objective: Vitals:   08/08/19 2013 08/08/19 2014 08/08/19 2015 08/08/19 2104  BP: (!) 120/56   111/75  Pulse:   65 80  Resp: (!) 24 (!) 21 (!) 22 20  Temp:    98.4 F (36.9 C)  TempSrc:    Oral  SpO2:   92% 100%  Weight:    75.5 kg  Height:    5\' 7"  (1.702 m)    Intake/Output Summary (Last 24 hours) at 08/09/2019 1215 Last data filed at 08/09/2019 1038 Gross per 24 hour  Intake 597.11 ml  Output 650 ml  Net -  52.89 ml   Filed Weights   08/08/19 1715 08/08/19 2104  Weight: 79.4 kg 75.5 kg    Examination:  General exam: Appears calm and comfortable  Respiratory system: Clear to auscultation. Respiratory effort normal. Cardiovascular system: S1 & S2 heard, RRR. No murmurs. No pedal edema. Gastrointestinal system:  Abdomen is nondistended, soft and nontender. No organomegaly or masses felt. Normal bowel sounds heard. Central nervous system: Alert and oriented x 2. No focal neurological deficits. Extremities: Symmetric 5 x 5 power. Skin: No rashes, lesions or ulcers Psychiatry: Judgement and insight appear normal. Mood & affect appropriate.     Data Reviewed: I have personally reviewed following labs and imaging studies  CBC: Recent Labs  Lab 08/08/19 1713 08/09/19 0112  WBC 7.9 7.4  NEUTROABS 5.0  --   HGB 14.9 13.5  HCT 42.9 38.6*  MCV 86.1 86.2  PLT 170 [id number]   Basic Metabolic Panel: Recent Labs  Lab 08/08/19 1713 08/09/19 0112  NA 136 140  K 3.9 4.1  CL 104 109  CO2 24 25  GLUCOSE 311* 207*  BUN 17 17  CREATININE 0.72 0.67  CALCIUM 9.6 9.2  MG 1.6* 2.2   GFR: Estimated Creatinine Clearance: 67.7 mL/min (by C-G formula based on SCr of 0.67 mg/dL). Liver Function Tests: Recent Labs  Lab 08/08/19 1713  AST 18  ALT 23  ALKPHOS 75  BILITOT 0.8  PROT 6.4*  ALBUMIN 3.7   No results for input(s): LIPASE, AMYLASE in the last 168 hours. No results for input(s): AMMONIA in the last 168 hours. Coagulation Profile: No results for input(s): INR, PROTIME in the last 168 hours. Cardiac Enzymes: No results for input(s): CKTOTAL, CKMB, CKMBINDEX, TROPONINI in the last 168 hours. BNP (last 3 results) No results for input(s): PROBNP in the last 8760 hours. HbA1C: No results for input(s): HGBA1C in the last 72 hours. CBG: Recent Labs  Lab 08/09/19 0915 08/09/19 1207  GLUCAP 148* 213*   Lipid Profile: Recent Labs    08/09/19 0112  CHOL 120  HDL 34*  LDLCALC 62  TRIG 119  CHOLHDL 3.5   Thyroid Function Tests: Recent Labs    08/08/19 1713  TSH 0.355  FREET4 1.55*   Anemia Panel: No results for input(s): VITAMINB12, FOLATE, FERRITIN, TIBC, IRON, RETICCTPCT in the last 72 hours. Sepsis Labs: No results for input(s): PROCALCITON, LATICACIDVEN in the last 168  hours.  Recent Results (from the past 240 hour(s))  SARS CORONAVIRUS 2 (TAT 6-24 HRS) Nasopharyngeal Nasopharyngeal Swab     Status: None   Collection Time: 08/08/19  6:44 PM   Specimen: Nasopharyngeal Swab  Result Value Ref Range Status   SARS Coronavirus 2 NEGATIVE NEGATIVE Final    Comment: (NOTE) SARS-CoV-2 target nucleic acids are NOT DETECTED. The SARS-CoV-2 RNA is generally detectable in upper and lower respiratory specimens during the acute phase of infection. Negative results do not preclude SARS-CoV-2 infection, do not rule out co-infections with other pathogens, and should not be used as the sole basis for treatment or other patient management decisions. Negative results must be combined with clinical observations, patient history, and epidemiological information. The expected result is Negative. Fact Sheet for Patients: SugarRoll.be Fact Sheet for Healthcare Providers: https://www.woods-mathews.com/ This test is not yet approved or cleared by the [place] FDA and  has been authorized for detection and/or diagnosis of SARS-CoV-2 by FDA under an Emergency Use Authorization (EUA). This EUA will remain  in effect (meaning this test can be used) for the  duration of the COVID-19 declaration under Section 56 4(b)(1) of the Act, 21 U.S.C. section 360bbb-3(b)(1), unless the authorization is terminated or revoked sooner. Performed at [facility], [place] [address] [place], [place] [address]          Radiology Studies: DG Chest Port 1 View  Result Date: 08/08/2019 CLINICAL DATA:  AFib with rapid ventricular response EXAM: PORTABLE CHEST 1 VIEW COMPARISON:  08/17/2015 FINDINGS: Left-sided pacing device with leads over the right atrium and right ventricle. Low lung volumes. No consolidation, pleural effusion or pneumothorax. Normal heart size. Aortic atherosclerosis. Apical pleural thickening and scarring. No pneumothorax.  IMPRESSION: No active disease.  Low lung volumes. Electronically Signed   By: Donavan Foil M.D.   On: 08/08/2019 17:40        Scheduled Meds: . apixaban  5 mg Oral BID  . diltiazem  120 mg Oral Daily  . insulin aspart  0-15 Units Subcutaneous TID WC  . insulin aspart  0-5 Units Subcutaneous QHS  . levothyroxine  100 mcg Oral QAC breakfast  . memantine  5 mg Oral BID  . multivitamin with minerals  1 tablet Oral Daily  . sertraline  25 mg Oral Daily  . tamsulosin  0.4 mg Oral BID   Continuous Infusions: . sodium chloride 100 mL/hr at 08/09/19 1023     LOS: 1 day    Time spent: 35 minutes    Sidney Ace, MD [facility] Pager [phone]  If 7PM-7AM, please contact night-coverage 08/09/2019, 12:15 PM

## 2019-08-10 LAB — GLUCOSE, CAPILLARY
Glucose-Capillary: 149 mg/dL — ABNORMAL HIGH (ref 70–99)
Glucose-Capillary: 178 mg/dL — ABNORMAL HIGH (ref 70–99)

## 2019-08-10 MED ORDER — DILTIAZEM HCL ER COATED BEADS 120 MG PO CP24: 120.0000 mg | ORAL_CAPSULE | Freq: Every day | ORAL | 0 refills | Status: DC

## 2019-08-10 MED ORDER — LORAZEPAM 0.5 MG PO TABS: 0.5000 mg | ORAL_TABLET | Freq: Every day | ORAL | 0 refills | Status: AC | PRN
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
## 2019-08-10 NOTE — Progress Notes (Signed)
Discussed discharge instructions with wife.  Including medications and follow up appointments.

## 2019-08-10 NOTE — Progress Notes (Signed)
Progress Note  Patient Name: Ernest [name] Date of Encounter: 08/10/2019  Primary Cardiologist: Dr. Caryl Comes  Subjective   No acute events overnight, patient in sinus rhythm.  Inpatient Medications    Scheduled Meds: . apixaban  5 mg Oral BID  . diltiazem  120 mg Oral Daily  . insulin aspart  0-15 Units Subcutaneous TID WC  . insulin aspart  0-5 Units Subcutaneous QHS  . levothyroxine  100 mcg Oral QAC breakfast  . memantine  5 mg Oral BID  . multivitamin with minerals  1 tablet Oral Daily  . sertraline  25 mg Oral Daily  . tamsulosin  0.4 mg Oral BID   Continuous Infusions:  PRN Meds: acetaminophen, diphenhydrAMINE, haloperidol **OR** haloperidol lactate, LORazepam, ondansetron (ZOFRAN) IV   Vital Signs    Vitals:   08/09/19 1543 08/09/19 1951 08/10/19 0406 08/10/19 0828  BP: (!) 144/68 (!) 153/74 (!) 166/75 (!) 120/56  Pulse: 79 65 84 77  Resp: 17 15 16 19   Temp: 98 F (36.7 C) 97.8 F (36.6 C) 97.7 F (36.5 C) 97.6 F (36.4 C)  TempSrc:  Oral Oral Oral  SpO2: 99% 98% 97% 97%  Weight:      Height:        Intake/Output Summary (Last 24 hours) at 08/10/2019 1142 Last data filed at 08/10/2019 1101 Gross per 24 hour  Intake 2857.43 ml  Output 1900 ml  Net 957.43 ml   Last 3 Weights 08/08/2019 08/08/2019 01/21/2019  Weight (lbs) 166 lb 8 oz 175 lb 172 lb 1.9 oz  Weight (kg) 75.524 kg 79.379 kg 78.073 kg      Telemetry    Sinus rhythm, heart rate 75- Personally Reviewed  ECG    No new EKG obtained today.  Personally Reviewed  Physical Exam   GEN:  Mild distress.   Neck: No JVD Cardiac: RRR, no murmurs, rubs, or gallops.  Respiratory: Clear to auscultation bilaterally. GI: Soft, nontender, non-distended  MS: No edema; No deformity. Neuro:  Nonfocal  Psych: Appears a little confused.  Labs    High Sensitivity Troponin:   Recent Labs  Lab 08/08/19 2343 08/09/19 0112  TROPONINIHS 9 10      Chemistry Recent Labs  Lab 08/08/19 1713  08/09/19 0112  NA 136 140  K 3.9 4.1  CL 104 109  CO2 24 25  GLUCOSE 311* 207*  BUN 17 17  CREATININE 0.72 0.67  CALCIUM 9.6 9.2  PROT 6.4*  --   ALBUMIN 3.7  --   AST 18  --   ALT 23  --   ALKPHOS 75  --   BILITOT 0.8  --   GFRNONAA >60 >60  GFRAA >60 >60  ANIONGAP 8 6     Hematology Recent Labs  Lab 08/08/19 1713 08/09/19 0112  WBC 7.9 7.4  RBC 4.98 4.48  HGB 14.9 13.5  HCT 42.9 38.6*  MCV 86.1 86.2  MCH 29.9 30.1  MCHC 34.7 35.0  RDW 12.5 12.4  PLT 170 165    BNPNo results for input(s): BNP, PROBNP in the last 168 hours.   DDimer No results for input(s): DDIMER in the last 168 hours.   Radiology    DG Chest Port 1 View  Result Date: 08/08/2019 CLINICAL DATA:  AFib with rapid ventricular response EXAM: PORTABLE CHEST 1 VIEW COMPARISON:  08/17/2015 FINDINGS: Left-sided pacing device with leads over the right atrium and right ventricle. Low lung volumes. No consolidation, pleural effusion or pneumothorax. Normal heart  size. Aortic atherosclerosis. Apical pleural thickening and scarring. No pneumothorax. IMPRESSION: No active disease.  Low lung volumes. Electronically Signed   By: Donavan Foil M.D.   On: 08/08/2019 17:40    Cardiac Studies   TTE 03/2015 Left ventricle: The cavity size was normal. There was mild  concentric hypertrophy. Systolic function was normal. The  estimated ejection fraction was in the range of 55% to 60%. Wall  motion was normal; there were no regional wall motion  abnormalities. Doppler parameters are consistent with abnormal  left ventricular relaxation (grade 1 diastolic dysfunction).  - Mitral valve: There was mild regurgitation.  - Left atrium: The atrium was mildly dilated.  - Right ventricle: Systolic function was normal.  - Pulmonary arteries: PA peak pressure: 36 mm Hg (S).   Patient Profile     84 y.o. male with history of dementia, paroxysmal A. fib, hypertension, sinus node dysfunction status post pacemaker  being seen due to A. fib with RVR.  Assessment & Plan    1.  Paroxysmal A. fib with RVR -Patient currently in sinus rhythm -Continue Eliquis, Cardizem extended release 120 mg daily -Plan for DC today.  Follow-up with Dr. Caryl Comes as outpatient.  2.  Dementia -Continue PTA meds -Management as per primary team  Continue chronic PTA meds.      Signed, Kate Sable, MD  08/10/2019, 11:42 AM

## 2019-08-10 NOTE — Discharge Summary (Signed)
Physician Discharge Summary  HA KRENGEL [id number] DOB: [birth year] DOA: 08/08/2019  PCP: Derinda Late, MD  Admit date: 08/08/2019 Discharge date: 08/10/2019  Admitted From: Home Disposition: Home  Recommendations for Outpatient Follow-up:  1. Follow up with PCP in 1-2 weeks 2.   Home Health: No Equipment/Devices: None Discharge Condition: Stable CODE STATUS: Full Diet recommendation: Heart Healthy  Brief/Interim Summary: [name] y.o.malewith a known history ofmultiple problems documented below, includingdementiawith behavioral changes, hypertension, dyslipidemia, coronary artery disease, and paroxysmal atrial fibrillation on Eliquis, who presented to the emergency room with acute onset ofpalpitations and associated chest pain. He has been having bouts of atrial fibrillation up to 102-103 per his wife who gives all the history due to his restlessness. This afternoon she states that she he had a spell where he was not feeling good and was not acting right. He was fairly confused. He had urinary incontinence. He did not have any reported fever or chills nausea or vomiting or abdominal pain. No dysuria, oliguria or hematuria or flank pain. He has been having visual and auditory hallucinations with his dementia lately. His amiodarone was recently discontinued by his cardiologist. During my interview the patient was trying to remove his [name] was fairly agitated and restless.  Upon presentation to the emergency room, temperature was 97.4 heart rate was 163with otherwise normal vital signs except for tachypnea later. Labs are remarkable for magnesium 1.6.EKG showed atrial fibrillation with rapid ventricular response of 137. COVID-19 PCR is currently pending. Portable chest ray showed no acute cardiopulmonary disease with low lung volumes.  1/30: Patient seen and examined.  Wife at bedside.  Patient less agitated.  Heart rate controlled.  Cardizem  drip discontinued overnight.  1/31: Patient has extreme nocturnal agitation.  Again became agitated and combative last night.  Kicking at staff.  IV Ativan and IV Benadryl ineffective.  IM Haldol appear to be effective.  Case discussed with cardiology.  Rate controlled overnight patient remains in sinus rhythm.  No further inpatient management from cardiology standpoint.  Long discussion with wife at bedside.  Wife agreeable to take patient home as she feels his delirium may improving home environment.  Discharge Diagnoses:  Active Problems:   Atrial fibrillation with RVR (HCC)   Dementia with behavioral disturbance (HCC)  Paroxysmal atrial fibrillation with rapid ventricular response chest pain. Initially required Cardizem gtt. Discontinued after conversion to sinus rhythm Cardiology consulted recommended Cardizem CD 120 mg daily Patient remained in sinus rhythm on above dose Continue Eliquis 5 mg twice daily on discharge Outpatient follow-up with cardiology, Dr. Caryl Comes  Hypothyroidismwith mild overcorrection.  TSH was low on free T4 is elevated in the setting of atrial fibrillation with RVR.  cut down his Synthroid dose to 100 MCG daily. Outpatient follow-up  Altered mental status. Delirium superimposed on dementia Persistent delirium/agitation while in house Psychiatric consult called, recommendations appreciated Can resume outpatient Namenda and Zoloft on discharge Per wife's request will prescribe low-dose Ativan 0.5 mg daily until patient is able to see PCP  Type 2 diabetes mellituswithout complications Hold oral agents Supplemental NovoLog coverage Can resume home regimen on discharge  Dyslipidemia  continue statin   Depression continue Zoloft.   Discharge Instructions  Discharge Instructions    Amb referral to AFIB Clinic   Complete by: As directed    Diet - low sodium heart healthy   Complete by: As directed    Increase activity slowly   Complete  by: As directed      Allergies as  of 08/10/2019   No Known Allergies     Medication List    TAKE these medications   apixaban 5 MG Tabs tablet Commonly known as: Eliquis Take 1 tablet (5 mg total) by mouth 2 (two) times daily.   diltiazem 120 MG 24 hr capsule Commonly known as: CARDIZEM CD Take 1 capsule (120 mg total) by mouth daily.   glipiZIDE 5 MG 24 hr tablet Commonly known as: GLUCOTROL XL Take 5 mg by mouth daily.   levothyroxine 125 MCG tablet Commonly known as: SYNTHROID Take 125 mcg by mouth daily before breakfast.   LORazepam 0.5 MG tablet Commonly known as: Ativan Take 1 tablet (0.5 mg total) by mouth daily as needed for anxiety.   lovastatin 40 MG tablet Commonly known as: MEVACOR Take 40 mg by mouth at bedtime.   memantine 5 MG tablet Commonly known as: NAMENDA Take 5 mg by mouth 2 (two) times daily.   metFORMIN 500 MG 24 hr tablet Commonly known as: GLUCOPHAGE-XR Take 1,000 mg by mouth daily.   metronidazole 1 % cream Commonly known as: NORITATE Apply 1 application topically daily.   multivitamin tablet Take 1 tablet by mouth daily.   sertraline 25 MG tablet Commonly known as: ZOLOFT Take 25 mg by mouth daily.   tamsulosin 0.4 MG Caps capsule Commonly known as: FLOMAX Take 0.4 mg by mouth 2 (two) times daily.       No Known Allergies  Consultations:  Cardiology- [place]  Psychiatry   Procedures/Studies: DG Chest Port 1 View  Result Date: 08/08/2019 CLINICAL DATA:  AFib with rapid ventricular response EXAM: PORTABLE CHEST 1 VIEW COMPARISON:  08/17/2015 FINDINGS: Left-sided pacing device with leads over the right atrium and right ventricle. Low lung volumes. No consolidation, pleural effusion or pneumothorax. Normal heart size. Aortic atherosclerosis. Apical pleural thickening and scarring. No pneumothorax. IMPRESSION: No active disease.  Low lung volumes. Electronically Signed   By: Donavan Foil M.D.   On: 08/08/2019 17:40     (Echo, Carotid, EGD, Colonoscopy, ERCP)    Subjective: Patient seen and examined Combative last night Calm this morning Wife at bedside Medically stable for discharge  Discharge Exam: Vitals:   08/10/19 0406 08/10/19 0828  BP: (!) 166/75 (!) 120/56  Pulse: 84 77  Resp: 16 19  Temp: 97.7 F (36.5 C) 97.6 F (36.4 C)  SpO2: 97% 97%   Vitals:   08/09/19 1543 08/09/19 1951 08/10/19 0406 08/10/19 0828  BP: (!) 144/68 (!) 153/74 (!) 166/75 (!) 120/56  Pulse: 79 65 84 77  Resp: 17 15 16 19   Temp: 98 F (36.7 C) 97.8 F (36.6 C) 97.7 F (36.5 C) 97.6 F (36.4 C)  TempSrc:  Oral Oral Oral  SpO2: 99% 98% 97% 97%  Weight:      Height:        General: Pt is alert, awake, not in acute distress Cardiovascular: RRR, S1/S2 +, no rubs, no gallops Respiratory: CTA bilaterally, no wheezing, no rhonchi Abdominal: Soft, NT, ND, bowel sounds + Extremities: no edema, no cyanosis    The results of significant diagnostics from this hospitalization (including imaging, microbiology, ancillary and laboratory) are listed below for reference.     Microbiology: Recent Results (from the past 240 hour(s))  SARS CORONAVIRUS 2 (TAT 6-24 HRS) Nasopharyngeal Nasopharyngeal Swab     Status: None   Collection Time: 08/08/19  6:44 PM   Specimen: Nasopharyngeal Swab  Result Value Ref Range Status   SARS Coronavirus 2 NEGATIVE NEGATIVE Final  Comment: (NOTE) SARS-CoV-2 target nucleic acids are NOT DETECTED. The SARS-CoV-2 RNA is generally detectable in upper and lower respiratory specimens during the acute phase of infection. Negative results do not preclude SARS-CoV-2 infection, do not rule out co-infections with other pathogens, and should not be used as the sole basis for treatment or other patient management decisions. Negative results must be combined with clinical observations, patient history, and epidemiological information. The expected result is Negative. Fact Sheet for  Patients: SugarRoll.be Fact Sheet for Healthcare Providers: https://www.woods-mathews.com/ This test is not yet approved or cleared by the [place] FDA and  has been authorized for detection and/or diagnosis of SARS-CoV-2 by FDA under an Emergency Use Authorization (EUA). This EUA will remain  in effect (meaning this test can be used) for the duration of the COVID-19 declaration under Section 56 4(b)(1) of the Act, 21 U.S.C. section 360bbb-3(b)(1), unless the authorization is terminated or revoked sooner. Performed at [facility], [place] [address], [place], [place] [address]      Labs: BNP (last 3 results) No results for input(s): BNP in the last 8760 hours. Basic Metabolic Panel: Recent Labs  Lab 08/08/19 1713 08/09/19 0112  NA 136 140  K 3.9 4.1  CL 104 109  CO2 24 25  GLUCOSE 311* 207*  BUN 17 17  CREATININE 0.72 0.67  CALCIUM 9.6 9.2  MG 1.6* 2.2   Liver Function Tests: Recent Labs  Lab 08/08/19 1713  AST 18  ALT 23  ALKPHOS 75  BILITOT 0.8  PROT 6.4*  ALBUMIN 3.7   No results for input(s): LIPASE, AMYLASE in the last 168 hours. No results for input(s): AMMONIA in the last 168 hours. CBC: Recent Labs  Lab 08/08/19 1713 08/09/19 0112  WBC 7.9 7.4  NEUTROABS 5.0  --   HGB 14.9 13.5  HCT 42.9 38.6*  MCV 86.1 86.2  PLT 170 165   Cardiac Enzymes: No results for input(s): CKTOTAL, CKMB, CKMBINDEX, TROPONINI in the last 168 hours. BNP: Invalid input(s): POCBNP CBG: Recent Labs  Lab 08/09/19 1207 08/09/19 1640 08/09/19 2155 08/10/19 0731 08/10/19 1152  GLUCAP 213* 123* 141* 149* 178*   D-Dimer No results for input(s): DDIMER in the last 72 hours. Hgb A1c Recent Labs    08/09/19 0112  HGBA1C 8.3*   Lipid Profile Recent Labs    08/09/19 0112  CHOL 120  HDL 34*  LDLCALC 62  TRIG 119  CHOLHDL 3.5   Thyroid function studies Recent Labs    08/08/19 1713  TSH 0.355   Anemia work  up No results for input(s): VITAMINB12, FOLATE, FERRITIN, TIBC, IRON, RETICCTPCT in the last 72 hours. Urinalysis    Component Value Date/Time   COLORURINE YELLOW (A) 08/09/2019 0109   APPEARANCEUR CLEAR (A) 08/09/2019 0109   APPEARANCEUR Clear 09/17/2013 2326   LABSPEC 1.017 08/09/2019 0109   LABSPEC 1.007 09/17/2013 2326   PHURINE 5.0 08/09/2019 0109   GLUCOSEU >=500 (A) 08/09/2019 0109   GLUCOSEU >=500 09/17/2013 2326   HGBUR NEGATIVE 08/09/2019 0109   BILIRUBINUR NEGATIVE 08/09/2019 0109   BILIRUBINUR Negative 09/17/2013 2326   KETONESUR NEGATIVE 08/09/2019 0109   PROTEINUR NEGATIVE 08/09/2019 0109   NITRITE NEGATIVE 08/09/2019 0109   LEUKOCYTESUR NEGATIVE 08/09/2019 0109   LEUKOCYTESUR Negative 09/17/2013 2326   Sepsis Labs Invalid input(s): PROCALCITONIN,  WBC,  LACTICIDVEN Microbiology Recent Results (from the past 240 hour(s))  SARS CORONAVIRUS 2 (TAT 6-24 HRS) Nasopharyngeal Nasopharyngeal Swab     Status: None   Collection  Time: 08/08/19  6:44 PM   Specimen: Nasopharyngeal Swab  Result Value Ref Range Status   SARS Coronavirus 2 NEGATIVE NEGATIVE Final    Comment: (NOTE) SARS-CoV-2 target nucleic acids are NOT DETECTED. The SARS-CoV-2 RNA is generally detectable in upper and lower respiratory specimens during the acute phase of infection. Negative results do not preclude SARS-CoV-2 infection, do not rule out co-infections with other pathogens, and should not be used as the sole basis for treatment or other patient management decisions. Negative results must be combined with clinical observations, patient history, and epidemiological information. The expected result is Negative. Fact Sheet for Patients: SugarRoll.be Fact Sheet for Healthcare Providers: https://www.woods-mathews.com/ This test is not yet approved or cleared by the [place] FDA and  has been authorized for detection and/or diagnosis of SARS-CoV-2  by FDA under an Emergency Use Authorization (EUA). This EUA will remain  in effect (meaning this test can be used) for the duration of the COVID-19 declaration under Section 56 4(b)(1) of the Act, 21 U.S.C. section 360bbb-3(b)(1), unless the authorization is terminated or revoked sooner. Performed at [facility], [place] [address] [place], [place] [address]      Time coordinating discharge: Over 30 minutes  SIGNED:   Sidney Ace, MD  [facility] 08/10/2019, 1:22 PM Pager   If 7PM-7AM, please contact night-coverage [email or web address] Password [email or web address]

## 2019-08-19 DIAGNOSIS — Z79899 Other long term (current) drug therapy: Secondary | ICD-10-CM | POA: Diagnosis not present

## 2019-08-19 DIAGNOSIS — Z0000 Encounter for general adult medical examination without abnormal findings: Secondary | ICD-10-CM | POA: Diagnosis not present

## 2019-09-01 ENCOUNTER — Other Ambulatory Visit: Payer: Self-pay

## 2019-09-01 ENCOUNTER — Telehealth: Payer: Self-pay | Admitting: Internal Medicine

## 2019-09-01 MED ORDER — DILTIAZEM HCL ER COATED BEADS 120 MG PO CP24: 120.0000 mg | ORAL_CAPSULE | Freq: Every day | ORAL | 0 refills | Status: DC

## 2019-09-01 NOTE — Telephone Encounter (Signed)
diltiazem (CARDIZEM CD) 120 MG 24 hr capsule 90 capsule 0 09/01/2019 10/01/2019   Sig - Route: Take 1 capsule (120 mg total) by mouth daily. - Oral   Sent to pharmacy as: diltiazem (CARDIZEM CD) 120 MG 24 hr capsule   E-Prescribing Status: Sent to pharmacy (09/01/2019 12:05 PM EST)   Pharmacy  [facility] ([place], [place]

## 2019-09-01 NOTE — Telephone Encounter (Signed)
*  STAT* If patient is at the pharmacy, call can be transferred to refill team.   1. Which medications need to be refilled? (please list name of each medication and dose if known) diltiazem 120 MG 1 capsule daily   2. Which pharmacy/location (including street and city if local pharmacy) is medication to be sent to? [facility]   3. Do they need a 30 day or 90 day supply? 90 day

## 2019-09-08 DIAGNOSIS — D2261 Melanocytic nevi of right upper limb, including shoulder: Secondary | ICD-10-CM | POA: Diagnosis not present

## 2019-09-08 DIAGNOSIS — D2271 Melanocytic nevi of right lower limb, including hip: Secondary | ICD-10-CM | POA: Diagnosis not present

## 2019-09-08 DIAGNOSIS — L821 Other seborrheic keratosis: Secondary | ICD-10-CM | POA: Diagnosis not present

## 2019-09-08 DIAGNOSIS — Z85828 Personal history of other malignant neoplasm of skin: Secondary | ICD-10-CM | POA: Diagnosis not present

## 2019-09-08 DIAGNOSIS — D2262 Melanocytic nevi of left upper limb, including shoulder: Secondary | ICD-10-CM | POA: Diagnosis not present

## 2019-09-08 DIAGNOSIS — X32XXXA Exposure to sunlight, initial encounter: Secondary | ICD-10-CM | POA: Diagnosis not present

## 2019-09-08 DIAGNOSIS — L570 Actinic keratosis: Secondary | ICD-10-CM | POA: Diagnosis not present

## 2019-09-23 ENCOUNTER — Other Ambulatory Visit: Payer: Self-pay

## 2019-09-23 ENCOUNTER — Encounter: Payer: Self-pay | Admitting: Internal Medicine

## 2019-09-23 ENCOUNTER — Ambulatory Visit (INDEPENDENT_AMBULATORY_CARE_PROVIDER_SITE_OTHER): Payer: PPO | Admitting: Internal Medicine

## 2019-09-23 VITALS — BP 96/50 | HR 66 | Ht 67.0 in | Wt 166.5 lb

## 2019-09-23 DIAGNOSIS — I480 Paroxysmal atrial fibrillation: Secondary | ICD-10-CM

## 2019-09-23 DIAGNOSIS — Z950 Presence of cardiac pacemaker: Secondary | ICD-10-CM

## 2019-09-23 DIAGNOSIS — R001 Bradycardia, unspecified: Secondary | ICD-10-CM | POA: Diagnosis not present

## 2019-09-23 LAB — CUP PACEART INCLINIC DEVICE CHECK
Battery Remaining Longevity: 73 mo
Battery Voltage: 3.01 V
Brady Statistic AP VP Percent: 0.02 %
Brady Statistic AP VS Percent: 33.11 %
Brady Statistic AS VP Percent: 0.03 %
Brady Statistic AS VS Percent: 66.84 %
Brady Statistic RA Percent Paced: 32.95 %
Brady Statistic RV Percent Paced: 0.06 %
Date Time Interrogation Session: 20210316124713
Implantable Lead Implant Date: 20170206
Implantable Lead Implant Date: 20170206
Implantable Lead Location: 753859
Implantable Lead Location: 753860
Implantable Lead Model: 5076
Implantable Lead Model: 5076
Implantable Pulse Generator Implant Date: 20170206
Lead Channel Impedance Value: 380 Ohm
Lead Channel Impedance Value: 399 Ohm
Lead Channel Impedance Value: 418 Ohm
Lead Channel Impedance Value: 437 Ohm
Lead Channel Pacing Threshold Amplitude: 0.5 V
Lead Channel Pacing Threshold Amplitude: 0.75 V
Lead Channel Pacing Threshold Pulse Width: 0.4 ms
Lead Channel Pacing Threshold Pulse Width: 0.4 ms
Lead Channel Sensing Intrinsic Amplitude: 1.75 mV
Lead Channel Sensing Intrinsic Amplitude: 9.25 mV
Lead Channel Setting Pacing Amplitude: 2 V
Lead Channel Setting Pacing Amplitude: 2.5 V
Lead Channel Setting Pacing Pulse Width: 0.4 ms
Lead Channel Setting Sensing Sensitivity: 2.8 mV

## 2019-09-23 NOTE — Progress Notes (Signed)
Electrophysiology Office Note   Date:  09/23/2019   ID:  Ernest [name], DOB Sep 12, 1935, MRN [id number]  PCP:  Derinda Late, MD  Cardiologist:  [facility] Primary Electrophysiologist:   Virl Axe, MD       History of Present Illness: Ernest [name] is a 84 y.o. male seen in electrophysiology followup  for  atrial fibrillation-paroxysmal with rapid ventricular response associated with sinus bradycardia. He is status post pacemaker 2/17 [employer].  He is managed with apixaban, previously  amiodarone.  DATE TEST EF   2015 Cath  70% Marginal  9/16 echo  55-60 mild LAE   1/21 Echo   NOT READ          Date TSH Cr K Hgb  9/17  3.98      12/17  4.12      12/18 5.016 0.8 4.3 14.3  7/19 4.462 0.8 4.5 14.5  6/20 1.96 0.9 4.2 14.7  1/21 0.355 0.67 4.1 13.5   Hospitalized 1/21 because of increasing confusion.  And found to be having paroxysms of A. fib with a rapid rate   The patient's memory continues to worsen.  His hallucinations are better.    Intermittent problems with dyspnea on exertion t    No bleeding   Past Medical History:  Diagnosis Date  . Aortic atherosclerosis ([place])   . Arthritis   . BPH (benign prostatic hypertrophy)   . Coronary artery disease    a. Cath 12/2013->[facility]->Med rx.  . Deviated nasal septum   . Diabetes mellitus without complication ([place])    type 2  . Essential hypertension   . GERD (gastroesophageal reflux disease)   . Hearing deficit    wears hearing aids  . Hyperlipidemia   . 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 PAF (paroxysmal atrial fibrillation) ([place])    a. CHA2DS2VASc = 7-->[facility];  b. 03/2015 Echo: EF 55-60%, Gr1 DD, mild MR, mildly dil LA, PASP 93mmHg.  Marland Kitchen Pneumonia    as a child  . Presence of permanent cardiac pacemaker 08/16/2015  . PVD (peripheral vascular disease) ([place])   . Rosacea   . Stroke ([place])   . Thyroid disease   . Urine frequency    Past Surgical History:  Procedure Laterality Date  . CARDIAC  CATHETERIZATION     [facility] 12/24/13: 30% LM , pLAD, ostial CX. 20% mLAD, mRCA. 70% OM2 (small vessel). Med RX.   Marland Kitchen CARDIAC CATHETERIZATION     [facility]  . CARDIAC CATHETERIZATION     [facility]  . CATARACT EXTRACTION W/ INTRAOCULAR LENS IMPLANT     right  . COLONOSCOPY    . EP IMPLANTABLE DEVICE N/A 08/16/2015   Procedure: Pacemaker Implant;  Surgeon: Deboraha Sprang, MD;  Location: [place] [facility];  Service: Cardiovascular;  Laterality: N/A;  . LUMBAR LAMINECTOMY/DECOMPRESSION MICRODISCECTOMY N/A 01/04/2015   Procedure: LUMBAR LAMINECTOMY/DECOMPRESSION MICRODISCECTOMY 3 LEVELS;  Surgeon: Newman Pies, MD;  Location: [place] NEURO ORS;  Service: Neurosurgery;  Laterality: N/A;  L23 L34 L45 laminectomies and foraminotomies  . MULTIPLE TOOTH EXTRACTIONS    . PROSTATE BIOPSY       Current Outpatient Medications  Medication Sig Dispense Refill  . apixaban (ELIQUIS) 5 MG TABS tablet Take 1 tablet (5 mg total) by mouth 2 (two) times daily. 180 tablet 1  . diltiazem (CARDIZEM CD) 120 MG 24 hr capsule Take 1 capsule (120 mg total) by mouth daily. 90 capsule 0  . glipiZIDE (GLUCOTROL XL) 5 MG  24 hr tablet Take 5 mg by mouth in the morning and at bedtime.     Marland Kitchen levothyroxine (SYNTHROID) 125 MCG tablet Take 125 mcg by mouth daily before breakfast.     . LORazepam (ATIVAN) 0.5 MG tablet Take 0.5 mg by mouth daily.    Marland Kitchen lovastatin (MEVACOR) 40 MG tablet Take 40 mg by mouth at bedtime.     . memantine (NAMENDA) 5 MG tablet Take 5 mg by mouth 2 (two) times daily.     . metronidazole (NORITATE) 1 % cream Apply 1 application topically daily.    . Multiple Vitamin (MULTIVITAMIN) tablet Take 1 tablet by mouth daily.    . sertraline (ZOLOFT) 25 MG tablet Take 25 mg by mouth daily.     . tamsulosin (FLOMAX) 0.4 MG CAPS capsule Take 0.4 mg by mouth 2 (two) times daily.     No current facility-administered medications for this visit.    Allergies:   Patient has no known allergies.   Social History:  The patient   reports that he has quit smoking. His smoking use included cigarettes. He has never used smokeless tobacco. He reports that he does not drink alcohol or use drugs.   Family History:  The patient's family history includes Cancer in his brother; Cancer - Lung in his brother, brother, and sister; Diabetes in his sister; Heart attack in his father and mother.    ROS:  Please see the history of present illness.   Otherwise, review of systems is negative .    PHYSICAL EXAM: VS:  BP (!) 96/50 (BP Location: Left Arm, Patient Position: Sitting, Cuff Size: Normal)   Pulse 66   Ht 5\' 7"  (1.702 m)   Wt 166 lb 8 oz (75.5 kg)   SpO2 97%   BMI 26.08 kg/m  , BMI Body mass index is 26.08 kg/m. Well developed and well nourished in no acute distress HENT normal Neck supple with JVP-flat Clear Device pocket well healed; without hematoma or erythema.  There is no tethering  Regular rate and rhythm, no   murmur Abd-soft with active BS No Clubbing cyanosis  edema Skin-warm and dry A & Oriented  Grossly normal sensory and motor function  ECG sinus @ 66 15/08/38       ASSESSMENT AND PLAN:  Atrial fibrillation-paroxysmal  Coronary artery disease- nonobstructive  Pacemaker-Medtronic the patient's device was interrogated.  The information was reviewed. No changes were made in the programming.     Prior stroke  Sinus bradycardia  Gait instability   Dementia  Hypotension   Paroxysmal atrial fibrillation.  I do not suspect it was responsible for the confusion prompting hospitalization in January.  It may be related to some of his exercise intolerance.  Repeat echocardiogram done in January has not yet been read.  Dementia remains an issue.  Suggested that they talk to PCP regarding palliative care support.  Low blood pressure at this time appears to be without symptoms.  We will have him take his diltiazem at night to try to mitigate his daytime effects.  Given his paroxysms of atrial  fibrillation with rapid rates, probably poor to maintain some degree of AV nodal blockade unfortunately; this may have an impact on blood pressure.  If he develops symptomatic hypotension, could consider either digoxin or AV junction ablation.  He is considered amiodarone toxic      Signed, Virl Axe, MD  09/23/2019 11:53 AM     [address]  [place] [address] [phone] (office) [phone] (fax)

## 2019-09-23 NOTE — Patient Instructions (Signed)

## 2019-10-09 ENCOUNTER — Ambulatory Visit (INDEPENDENT_AMBULATORY_CARE_PROVIDER_SITE_OTHER): Payer: PPO | Admitting: *Deleted

## 2019-10-09 DIAGNOSIS — R001 Bradycardia, unspecified: Secondary | ICD-10-CM

## 2019-10-09 LAB — CUP PACEART REMOTE DEVICE CHECK
Battery Remaining Longevity: 73 mo
Battery Voltage: 3.01 V
Brady Statistic AP VP Percent: 0.03 %
Brady Statistic AP VS Percent: 53.12 %
Brady Statistic AS VP Percent: 0.02 %
Brady Statistic AS VS Percent: 46.84 %
Brady Statistic RA Percent Paced: 53.12 %
Brady Statistic RV Percent Paced: 0.05 %
Date Time Interrogation Session: 20210401121750
Implantable Lead Implant Date: 20170206
Implantable Lead Implant Date: 20170206
Implantable Lead Location: 753859
Implantable Lead Location: 753860
Implantable Lead Model: 5076
Implantable Lead Model: 5076
Implantable Pulse Generator Implant Date: 20170206
Lead Channel Impedance Value: 380 Ohm
Lead Channel Impedance Value: 380 Ohm
Lead Channel Impedance Value: 437 Ohm
Lead Channel Impedance Value: 437 Ohm
Lead Channel Pacing Threshold Amplitude: 0.5 V
Lead Channel Pacing Threshold Amplitude: 0.75 V
Lead Channel Pacing Threshold Pulse Width: 0.4 ms
Lead Channel Pacing Threshold Pulse Width: 0.4 ms
Lead Channel Sensing Intrinsic Amplitude: 1.125 mV
Lead Channel Sensing Intrinsic Amplitude: 1.125 mV
Lead Channel Sensing Intrinsic Amplitude: 7.625 mV
Lead Channel Sensing Intrinsic Amplitude: 7.625 mV
Lead Channel Setting Pacing Amplitude: 2 V
Lead Channel Setting Pacing Amplitude: 2.5 V
Lead Channel Setting Pacing Pulse Width: 0.4 ms
Lead Channel Setting Sensing Sensitivity: 2.8 mV

## 2019-10-10 NOTE — Progress Notes (Signed)
PPM Remote  

## 2019-10-30 ENCOUNTER — Other Ambulatory Visit: Payer: Self-pay

## 2019-10-30 MED ORDER — DILTIAZEM HCL ER COATED BEADS 120 MG PO CP24: 120.0000 mg | ORAL_CAPSULE | Freq: Every day | ORAL | 3 refills | Status: DC

## 2019-11-10 DIAGNOSIS — E1165 Type 2 diabetes mellitus with hyperglycemia: Secondary | ICD-10-CM | POA: Diagnosis not present

## 2019-11-10 DIAGNOSIS — Z79899 Other long term (current) drug therapy: Secondary | ICD-10-CM | POA: Diagnosis not present

## 2019-11-10 DIAGNOSIS — E039 Hypothyroidism, unspecified: Secondary | ICD-10-CM | POA: Diagnosis not present

## 2019-11-12 ENCOUNTER — Other Ambulatory Visit: Payer: Self-pay

## 2019-11-12 MED ORDER — APIXABAN 5 MG PO TABS: 5.0000 mg | ORAL_TABLET | Freq: Two times a day (BID) | ORAL | 1 refills | Status: DC

## 2019-11-17 DIAGNOSIS — E039 Hypothyroidism, unspecified: Secondary | ICD-10-CM | POA: Diagnosis not present

## 2019-11-17 DIAGNOSIS — F0281 Dementia in other diseases classified elsewhere with behavioral disturbance: Secondary | ICD-10-CM | POA: Diagnosis not present

## 2019-11-17 DIAGNOSIS — I739 Peripheral vascular disease, unspecified: Secondary | ICD-10-CM | POA: Diagnosis not present

## 2019-11-17 DIAGNOSIS — I700 Atherosclerosis of aorta: Secondary | ICD-10-CM | POA: Diagnosis not present

## 2019-11-17 DIAGNOSIS — E1165 Type 2 diabetes mellitus with hyperglycemia: Secondary | ICD-10-CM | POA: Diagnosis not present

## 2019-11-17 DIAGNOSIS — G301 Alzheimer's disease with late onset: Secondary | ICD-10-CM | POA: Diagnosis not present

## 2019-11-17 DIAGNOSIS — I480 Paroxysmal atrial fibrillation: Secondary | ICD-10-CM | POA: Diagnosis not present

## 2020-01-08 ENCOUNTER — Ambulatory Visit (INDEPENDENT_AMBULATORY_CARE_PROVIDER_SITE_OTHER): Payer: PPO | Admitting: *Deleted

## 2020-01-08 DIAGNOSIS — I495 Sick sinus syndrome: Secondary | ICD-10-CM

## 2020-01-09 LAB — CUP PACEART REMOTE DEVICE CHECK
Battery Remaining Longevity: 74 mo
Battery Voltage: 3.01 V
Brady Statistic AP VP Percent: 0.03 %
Brady Statistic AP VS Percent: 40.08 %
Brady Statistic AS VP Percent: 0.02 %
Brady Statistic AS VS Percent: 59.87 %
Brady Statistic RA Percent Paced: 40.09 %
Brady Statistic RV Percent Paced: 0.06 %
Date Time Interrogation Session: 20210701212821
Implantable Lead Implant Date: 20170206
Implantable Lead Implant Date: 20170206
Implantable Lead Location: 753859
Implantable Lead Location: 753860
Implantable Lead Model: 5076
Implantable Lead Model: 5076
Implantable Pulse Generator Implant Date: 20170206
Lead Channel Impedance Value: 380 Ohm
Lead Channel Impedance Value: 399 Ohm
Lead Channel Impedance Value: 437 Ohm
Lead Channel Impedance Value: 456 Ohm
Lead Channel Pacing Threshold Amplitude: 0.5 V
Lead Channel Pacing Threshold Amplitude: 0.75 V
Lead Channel Pacing Threshold Pulse Width: 0.4 ms
Lead Channel Pacing Threshold Pulse Width: 0.4 ms
Lead Channel Sensing Intrinsic Amplitude: 1.25 mV
Lead Channel Sensing Intrinsic Amplitude: 1.25 mV
Lead Channel Sensing Intrinsic Amplitude: 7.625 mV
Lead Channel Sensing Intrinsic Amplitude: 7.625 mV
Lead Channel Setting Pacing Amplitude: 2 V
Lead Channel Setting Pacing Amplitude: 2.5 V
Lead Channel Setting Pacing Pulse Width: 0.4 ms
Lead Channel Setting Sensing Sensitivity: 2.8 mV

## 2020-01-09 NOTE — Progress Notes (Signed)
Remote pacemaker transmission.   

## 2020-03-12 DIAGNOSIS — E1165 Type 2 diabetes mellitus with hyperglycemia: Secondary | ICD-10-CM | POA: Diagnosis not present

## 2020-03-19 DIAGNOSIS — E1165 Type 2 diabetes mellitus with hyperglycemia: Secondary | ICD-10-CM | POA: Diagnosis not present

## 2020-03-19 DIAGNOSIS — F0281 Dementia in other diseases classified elsewhere with behavioral disturbance: Secondary | ICD-10-CM | POA: Diagnosis not present

## 2020-03-19 DIAGNOSIS — I480 Paroxysmal atrial fibrillation: Secondary | ICD-10-CM | POA: Diagnosis not present

## 2020-03-19 DIAGNOSIS — I739 Peripheral vascular disease, unspecified: Secondary | ICD-10-CM | POA: Diagnosis not present

## 2020-03-19 DIAGNOSIS — G301 Alzheimer's disease with late onset: Secondary | ICD-10-CM | POA: Diagnosis not present

## 2020-03-19 DIAGNOSIS — E039 Hypothyroidism, unspecified: Secondary | ICD-10-CM | POA: Diagnosis not present

## 2020-03-19 DIAGNOSIS — Z79899 Other long term (current) drug therapy: Secondary | ICD-10-CM | POA: Diagnosis not present

## 2020-03-19 DIAGNOSIS — I700 Atherosclerosis of aorta: Secondary | ICD-10-CM | POA: Diagnosis not present

## 2020-03-20 ENCOUNTER — Emergency Department: Payer: PPO

## 2020-03-20 ENCOUNTER — Emergency Department
Admission: EM | Admit: 2020-03-20 | Discharge: 2020-03-20 | Disposition: A | Discharge status: Home / Self Care | Payer: PPO | Attending: Emergency Medicine | Admitting: Emergency Medicine

## 2020-03-20 ENCOUNTER — Encounter: Payer: Self-pay | Admitting: Emergency Medicine

## 2020-03-20 ENCOUNTER — Other Ambulatory Visit: Payer: Self-pay

## 2020-03-20 DIAGNOSIS — Z20822 Contact with and (suspected) exposure to covid-19: Secondary | ICD-10-CM | POA: Diagnosis not present

## 2020-03-20 DIAGNOSIS — I480 Paroxysmal atrial fibrillation: Secondary | ICD-10-CM | POA: Diagnosis not present

## 2020-03-20 DIAGNOSIS — Z7901 Long term (current) use of anticoagulants: Secondary | ICD-10-CM | POA: Insufficient documentation

## 2020-03-20 DIAGNOSIS — Z87891 Personal history of nicotine dependence: Secondary | ICD-10-CM | POA: Insufficient documentation

## 2020-03-20 DIAGNOSIS — I2510 Atherosclerotic heart disease of native coronary artery without angina pectoris: Secondary | ICD-10-CM | POA: Insufficient documentation

## 2020-03-20 DIAGNOSIS — J90 Pleural effusion, not elsewhere classified: Secondary | ICD-10-CM | POA: Diagnosis not present

## 2020-03-20 DIAGNOSIS — J9811 Atelectasis: Secondary | ICD-10-CM | POA: Diagnosis not present

## 2020-03-20 DIAGNOSIS — F0390 Unspecified dementia without behavioral disturbance: Secondary | ICD-10-CM | POA: Insufficient documentation

## 2020-03-20 DIAGNOSIS — E119 Type 2 diabetes mellitus without complications: Secondary | ICD-10-CM | POA: Diagnosis not present

## 2020-03-20 DIAGNOSIS — R079 Chest pain, unspecified: Secondary | ICD-10-CM | POA: Diagnosis not present

## 2020-03-20 DIAGNOSIS — I119 Hypertensive heart disease without heart failure: Secondary | ICD-10-CM | POA: Diagnosis not present

## 2020-03-20 DIAGNOSIS — R0602 Shortness of breath: Secondary | ICD-10-CM | POA: Diagnosis not present

## 2020-03-20 DIAGNOSIS — R0789 Other chest pain: Secondary | ICD-10-CM | POA: Diagnosis present

## 2020-03-20 DIAGNOSIS — Z79890 Hormone replacement therapy (postmenopausal): Secondary | ICD-10-CM | POA: Diagnosis not present

## 2020-03-20 DIAGNOSIS — I4891 Unspecified atrial fibrillation: Secondary | ICD-10-CM | POA: Diagnosis not present

## 2020-03-20 LAB — COMPREHENSIVE METABOLIC PANEL
ALT: 20 U/L (ref 0–44)
AST: 18 U/L (ref 15–41)
Albumin: 4.1 g/dL (ref 3.5–5.0)
Alkaline Phosphatase: 82 U/L (ref 38–126)
Anion gap: 9 (ref 5–15)
BUN: 22 mg/dL (ref 8–23)
CO2: 25 mmol/L (ref 22–32)
Calcium: 10 mg/dL (ref 8.9–10.3)
Chloride: 102 mmol/L (ref 98–111)
Creatinine, Ser: 0.7 mg/dL (ref 0.61–1.24)
GFR calc Af Amer: >60 mL/min (ref >60)
GFR calc non Af Amer: >60 mL/min (ref >60)
Glucose, Bld: 239 mg/dL — ABNORMAL HIGH (ref 70–99)
Potassium: 4.2 mmol/L (ref 3.5–5.1)
Sodium: 136 mmol/L (ref 135–145)
Total Bilirubin: 0.8 mg/dL (ref 0.3–1.2)
Total Protein: 7.2 g/dL (ref 6.5–8.1)

## 2020-03-20 LAB — BRAIN NATRIURETIC PEPTIDE: B Natriuretic Peptide: 64.8 pg/mL (ref 0.0–100.0)

## 2020-03-20 LAB — CBC WITH DIFFERENTIAL/PLATELET
Abs Immature Granulocytes: 0.02 10*3/uL (ref 0.00–0.07)
Basophils Absolute: 0.1 10*3/uL (ref 0.0–0.1)
Basophils Relative: 1 %
Eosinophils Absolute: 0.2 10*3/uL (ref 0.0–0.5)
Eosinophils Relative: 2 %
HCT: 42.9 % (ref 39.0–52.0)
Hemoglobin: 14.9 g/dL (ref 13.0–17.0)
Immature Granulocytes: 0 %
Lymphocytes Relative: 37 %
Lymphs Abs: 3.7 10*3/uL (ref 0.7–4.0)
MCH: 30.3 pg (ref 26.0–34.0)
MCHC: 34.7 g/dL (ref 30.0–36.0)
MCV: 87.4 fL (ref 80.0–100.0)
Monocytes Absolute: 1 10*3/uL (ref 0.1–1.0)
Monocytes Relative: 10 %
Neutro Abs: 4.9 10*3/uL (ref 1.7–7.7)
Neutrophils Relative %: 50 %
Platelets: 200 10*3/uL (ref 150–400)
RBC: 4.91 MIL/uL (ref 4.22–5.81)
RDW: 12.2 % (ref 11.5–15.5)
WBC: 9.9 10*3/uL (ref 4.0–10.5)
nRBC: 0 % (ref 0.0–0.2)

## 2020-03-20 LAB — TROPONIN I (HIGH SENSITIVITY)
Troponin I (High Sensitivity): 5 ng/L (ref <18)
Troponin I (High Sensitivity): 6 ng/L (ref <18)

## 2020-03-20 LAB — MAGNESIUM: Magnesium: 1.9 mg/dL (ref 1.7–2.4)

## 2020-03-20 LAB — SARS CORONAVIRUS 2 BY RT PCR (HOSPITAL ORDER, PERFORMED IN ~~LOC~~ HOSPITAL LAB): SARS Coronavirus 2: NEGATIVE

## 2020-03-20 MED ORDER — DILTIAZEM HCL 25 MG/5ML IV SOLN
INTRAVENOUS | Status: AC
  Administered 2020-03-20: 10 mg
  Filled 2020-03-20: qty 5

## 2020-03-20 MED ORDER — DILTIAZEM HCL-DEXTROSE 125-5 MG/125ML-% IV SOLN (PREMIX)
5.0000 mg/h | INTRAVENOUS | Status: DC
  Administered 2020-03-20: 5 mg/h via INTRAVENOUS

## 2020-03-20 MED ORDER — DILTIAZEM LOAD VIA INFUSION
10.0000 mg | Freq: Once | INTRAVENOUS | Status: DC
  Filled 2020-03-20: qty 10

## 2020-03-20 MED ORDER — DILTIAZEM HCL ER COATED BEADS 120 MG PO CP24
120.0000 mg | ORAL_CAPSULE | Freq: Once | ORAL | Status: AC
  Administered 2020-03-20: 120 mg via ORAL
  Filled 2020-03-20: qty 1

## 2020-03-20 NOTE — ED Triage Notes (Addendum)
Pt in from home with L sided cp that woke him up at 0200 per wife. States he went to the bathroom, and pain persisted. Denies any radiation to any other area, sob, n/v or dizziness. Has dementia, hx pacemaker and afib. Tachycardic in triage

## 2020-03-20 NOTE — ED Provider Notes (Signed)
-----------------------------------------   7:54 AM on 03/20/2020 -----------------------------------------  The patient remains in sinus rhythm, and he and his family member are eager to go home.  He is stable for discharge at this time.  Return precautions provided, and the family member expressed understanding.   Arta Silence, MD 03/20/20 [phone]

## 2020-03-20 NOTE — ED Notes (Signed)
Pt ambulated with this RN at bedside, about 25 steps. No exertion noted, continued NSR on monitor.

## 2020-03-20 NOTE — ED Notes (Signed)
Pt noted to be in NSR - EKG taken and given to EDP. Dilt gtt stopped

## 2020-03-20 NOTE — ED Provider Notes (Signed)
[facility] Emergency Department Provider Note  ____________________________________________   First MD Initiated Contact with Patient 03/20/20 [phone]     (approximate)  I have reviewed the triage vital signs and the nursing notes.   HISTORY  Chief Complaint Chest Pain and Tachycardia    HPI Ernest [name] is a 84 y.o. male with past medical history as below including known history of paroxysmal A. fib, here with chest pain.  Patient is demented, somewhat limiting history.  However, per his wife's report, the patient woke up around 2 AM and went to the restroom.  He had difficulty getting back and was complaining of chest pain.  He looked like he felt very fatigued.  He subsequently was brought to the ED.  She checked his blood pressure and pulse and noted it was in the high 100s, but then her blood pressure cuff died at home.  He arrives to the ED with complaint of mild chest pain.  However, on further questioning he denies this.  However, as mentioned, patient is demented, limiting history.  Of note, he has a history of prior admissions for A. fib RVR.  Wife does not recall any recent medication changes or illnesses.  Level 5 caveat invoked as remainder of history, ROS, and physical exam limited due to patient's dementia.         Past Medical History:  Diagnosis Date  . Aortic atherosclerosis ([place])   . Arthritis   . BPH (benign prostatic hypertrophy)   . Coronary artery disease    a. Cath 12/2013->[facility]->Med rx.  . Deviated nasal septum   . Diabetes mellitus without complication ([place])    type 2  . Essential hypertension   . GERD (gastroesophageal reflux disease)   . Hearing deficit    wears hearing aids  . Hyperlipidemia   . 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 PAF (paroxysmal atrial fibrillation) ([place])    a. CHA2DS2VASc = 7-->eliquis;  b. 03/2015 Echo: EF 55-60%, Gr1 DD, mild MR, mildly dil LA, PASP 42mmHg.  Marland Kitchen Pneumonia    as a child  .  Presence of permanent cardiac pacemaker 08/16/2015  . PVD (peripheral vascular disease) ([place])   . Rosacea   . Stroke ([place])   . Thyroid disease   . Urine frequency     Patient Active Problem List   Diagnosis Date Noted  . Dementia with behavioral disturbance ([place]) 08/09/2019  . Sinus node dysfunction ([place]) 08/16/2015  . Bradycardia 08/16/2015  . PAF (paroxysmal atrial fibrillation) ([place])   . Coronary artery disease   . Essential hypertension   . Hypertension 03/11/2015  . Type 2 diabetes mellitus ([place]) 03/11/2015  . GERD (gastroesophageal reflux disease) 03/11/2015  . Hyperlipidemia 03/11/2015  . BPH (benign prostatic hyperplasia) 03/11/2015  . Atrial fibrillation with rapid ventricular response ([place]) 03/11/2015  . Atrial fibrillation with RVR ([place]) 03/10/2015  . Lumbar stenosis with neurogenic claudication 01/04/2015    Past Surgical History:  Procedure Laterality Date  . CARDIAC CATHETERIZATION     [facility] 12/24/13: 30% LM , pLAD, ostial CX. 20% mLAD, mRCA. 70% OM2 (small vessel). Med RX.   Marland Kitchen CARDIAC CATHETERIZATION     [facility]  . CARDIAC CATHETERIZATION     [facility]  . CATARACT EXTRACTION W/ INTRAOCULAR LENS IMPLANT     right  . COLONOSCOPY    . EP IMPLANTABLE DEVICE N/A 08/16/2015   Procedure: Pacemaker Implant;  Surgeon: Deboraha Sprang, MD;  Location: [place] [facility]  LAB;  Service: Cardiovascular;  Laterality: N/A;  . LUMBAR LAMINECTOMY/DECOMPRESSION MICRODISCECTOMY N/A 01/04/2015   Procedure: LUMBAR LAMINECTOMY/DECOMPRESSION MICRODISCECTOMY 3 LEVELS;  Surgeon: Newman Pies, MD;  Location: [place] ORS;  Service: Neurosurgery;  Laterality: N/A;  L23 L34 L45 laminectomies and foraminotomies  . MULTIPLE TOOTH EXTRACTIONS    . PROSTATE BIOPSY      Prior to Admission medications   Medication Sig Start Date End Date Taking? Authorizing Provider  apixaban (ELIQUIS) 5 MG TABS tablet Take 1 tablet (5 mg total) by mouth 2 (two) times daily. 11/12/19   Deboraha Sprang, MD  diltiazem  (CARDIZEM CD) 120 MG 24 hr capsule Take 1 capsule (120 mg total) by mouth daily. 10/30/19 11/29/19  Deboraha Sprang, MD  glipiZIDE (GLUCOTROL XL) 5 MG 24 hr tablet Take 5 mg by mouth in the morning and at bedtime.  01/13/19 01/13/20  [provider]  levothyroxine (SYNTHROID) 125 MCG tablet Take 125 mcg by mouth daily before breakfast.     [provider]  LORazepam (ATIVAN) 0.5 MG tablet Take 0.5 mg by mouth daily.    [provider]  lovastatin (MEVACOR) 40 MG tablet Take 40 mg by mouth at bedtime.     [provider]  memantine (NAMENDA) 5 MG tablet Take 5 mg by mouth 2 (two) times daily.  02/19/18   [provider]  metronidazole (NORITATE) 1 % cream Apply 1 application topically daily.    [provider]  Multiple Vitamin (MULTIVITAMIN) tablet Take 1 tablet by mouth daily.    [provider]  sertraline (ZOLOFT) 25 MG tablet Take 25 mg by mouth daily.  12/12/18   [provider]  tamsulosin (FLOMAX) 0.4 MG CAPS capsule Take 0.4 mg by mouth 2 (two) times daily.    [provider]    Allergies Patient has no known allergies.  Family History  Problem Relation Age of Onset  . Heart attack Mother   . Heart attack Father   . Cancer - Lung Sister   . Cancer - Lung Brother   . Cancer - Lung Brother   . Cancer Brother   . Diabetes Sister     Social History Social History   Tobacco Use  . Smoking status: Former Smoker    Types: Cigarettes  . Smokeless tobacco: Never Used  . Tobacco comment: " Quit smoking cigarettes in 1983 "  Substance Use Topics  . Alcohol use: No  . Drug use: No    Review of Systems  Review of Systems  Unable to perform ROS: Dementia  Constitutional: Positive for fatigue.  Cardiovascular: Positive for chest pain.  Neurological: Negative for weakness.     ____________________________________________  PHYSICAL EXAM:      VITAL SIGNS: ED Triage Vitals  Enc Vitals Group     BP  03/20/20 0358 116/66     Pulse Rate 03/20/20 0355 (!) 160     Resp 03/20/20 0358 19     Temp 03/20/20 0355 97.7 F (36.5 C)     Temp Source 03/20/20 0355 Oral     SpO2 03/20/20 0358 96 %     Weight 03/20/20 0402 166 lb (75.3 kg)     Height --      Head Circumference --      Peak Flow --      Pain Score --      Pain Loc --      Pain Edu? --      Excl. in [place]? --  Physical Exam Vitals and nursing note reviewed.  Constitutional:      General: He is not in acute distress.    Appearance: He is well-developed.  HENT:     Head: Normocephalic and atraumatic.  Eyes:     Conjunctiva/sclera: Conjunctivae normal.  Cardiovascular:     Rate and Rhythm: Tachycardia present. Rhythm irregularly irregular.     Heart sounds: Normal heart sounds. No murmur heard.  No friction rub.  Pulmonary:     Effort: Pulmonary effort is normal. Tachypnea present. No respiratory distress.     Breath sounds: Normal breath sounds. No wheezing or rales.  Abdominal:     General: There is no distension.     Palpations: Abdomen is soft.     Tenderness: There is no abdominal tenderness.  Musculoskeletal:     Cervical back: Neck supple.  Skin:    General: Skin is warm.     Capillary Refill: Capillary refill takes less than 2 seconds.  Neurological:     Mental Status: He is alert. Mental status is at baseline. He is disoriented.     Motor: No abnormal muscle tone.       ____________________________________________   LABS (all labs ordered are listed, but only abnormal results are displayed)  Labs Reviewed  COMPREHENSIVE METABOLIC PANEL - Abnormal; Notable for the following components:      Result Value   Glucose, Bld 239 (*)    All other components within normal limits  SARS CORONAVIRUS 2 BY RT PCR (HOSPITAL ORDER, [facility])  CBC WITH DIFFERENTIAL/PLATELET  MAGNESIUM  BRAIN NATRIURETIC PEPTIDE  TROPONIN I (HIGH SENSITIVITY)  TROPONIN I (HIGH SENSITIVITY)     ____________________________________________  EKG: Atrial fibrillation, ventricular rate 166.  QRS 88, QTc 465.  Diffuse ST changes, likely rate related without overt ST elevations. ________________________________________  RADIOLOGY All imaging, including plain films, CT scans, and ultrasounds, independently reviewed by me, and interpretations confirmed via formal radiology reads.  ED MD interpretation:   Chest x-ray: Mild basilar atelectasis  Official radiology report(s): DG Chest Portable 1 View  Result Date: 03/20/2020 CLINICAL DATA:  Pt in from home with L sided cp that woke him up at 0200 per wife. States he went to the bathroom, and pain persisted. Denies any radiation to any other area, sob, n/v or dizziness. Has dementia, hx pacemaker and afib. Tachycardic in triageSOB EXAM: PORTABLE CHEST 1 VIEW COMPARISON:  08/08/2019 FINDINGS: LEFT-sided pacemaker overlies normal cardiac silhouette. No effusion, infiltrate, or pneumothorax. Mild basilar atelectasis. No acute osseous abnormality. IMPRESSION: Mild basilar atelectasis. Electronically Signed   By: Suzy Bouchard M.D.   On: 03/20/2020 04:26    ____________________________________________  PROCEDURES   Procedure(s) performed (including Critical Care):  Procedures  ____________________________________________  INITIAL IMPRESSION / MDM / [place] / ED COURSE  As part of my medical decision making, I reviewed the following data within the [facility] notes reviewed and incorporated, Old chart reviewed, Notes from prior ED visits, and [place] Controlled Substance Database       *DEVEION DENZ was evaluated in Emergency Department on 03/20/2020 for the symptoms described in the history of present illness. He was evaluated in the context of the global COVID-19 pandemic, which necessitated consideration that the patient might be at risk for infection with the SARS-CoV-2 virus that causes  COVID-19. Institutional protocols and algorithms that pertain to the evaluation of patients at risk for COVID-19 are in a state of rapid change  based on information released by regulatory bodies including the CDC and federal and state organizations. These policies and algorithms were followed during the patient's care in the ED.  Some ED evaluations and interventions may be delayed as a result of limited staffing during the pandemic.*     Medical Decision Making:  84 yo M here with transient chest pain, weakness. On arrival, pt in AFib RVR but non-toxic, mentating at baseline. Started on diltiazem bolus and drip with initial improvement. Pt then noted to cardiovert to NSR and remains in NSR multiple hours after discontinuing dilt drip. Labs reassuring - normal trop x 2, doubt ischemic trigger. COVID negative. CBC without leukocytosis or anemia. Lytes wnl/at baseline. Suspect transient AFib RVR now resolved. No apparent infectious, ischemic, or other trigger. Given that he is now in NSR, ambulatory at his baseline, and well appearing, will d/c with outpt cardiology f/u.   ____________________________________________  FINAL CLINICAL IMPRESSION(S) / ED DIAGNOSES  Final diagnoses:  Paroxysmal atrial fibrillation ([place])     MEDICATIONS GIVEN DURING THIS VISIT:  Medications  diltiazem (CARDIZEM) 1 mg/mL load via infusion 10 mg (has no administration in time range)    And  diltiazem (CARDIZEM) 125 mg in dextrose 5% 125 mL (1 mg/mL) infusion (0 mg/hr Intravenous Stopped 03/20/20 0614)  diltiazem (CARDIZEM) 25 MG/5ML injection (10 mg  Given 03/20/20 0358)  diltiazem (CARDIZEM CD) 24 hr capsule 120 mg (120 mg Oral Given 03/20/20 0801)     ED Discharge Orders    None       Note:  This document was prepared using Dragon voice recognition software and may include unintentional dictation errors.   Duffy Bruce, MD 03/20/20 0930

## 2020-03-20 NOTE — Discharge Instructions (Addendum)
Make sure Ernest [name] stays hydrated, and keep an eye on his blood pressure and heart rate at home.  He was given his diltiazem dose today. This should help prevent his heart rate from going back up.  I'd recommend calling your primary on Monday to discuss his ED visit and to arrange possible medication adjustments  Return to the ER as needed

## 2020-03-22 ENCOUNTER — Telehealth: Payer: Self-pay | Admitting: Internal Medicine

## 2020-03-22 NOTE — Telephone Encounter (Signed)
°  Patient Consent for Virtual Visit         Ernest [name] has provided verbal consent on 03/22/2020 for a virtual visit (video or telephone).   CONSENT FOR VIRTUAL VISIT FOR:  Ernest [name]  By participating in this virtual visit I agree to the following:  I hereby voluntarily request, consent and authorize [facility] and its employed or contracted physicians, physician assistants, nurse practitioners or other licensed health care professionals (the Practitioner), to provide me with telemedicine health care services (the Services") as deemed necessary by the treating Practitioner. I acknowledge and consent to receive the Services by the Practitioner via telemedicine. I understand that the telemedicine visit will involve communicating with the Practitioner through live audiovisual communication technology and the disclosure of certain medical information by electronic transmission. I acknowledge that I have been given the opportunity to request an in-person assessment or other available alternative prior to the telemedicine visit and am voluntarily participating in the telemedicine visit.  I understand that I have the right to withhold or withdraw my consent to the use of telemedicine in the course of my care at any time, without affecting my right to future care or treatment, and that the Practitioner or I may terminate the telemedicine visit at any time. I understand that I have the right to inspect all information obtained and/or recorded in the course of the telemedicine visit and may receive copies of available information for a reasonable fee.  I understand that some of the potential risks of receiving the Services via telemedicine include:   Delay or interruption in medical evaluation due to technological equipment failure or disruption;  Information transmitted may not be sufficient (e.g. poor resolution of images) to allow for appropriate medical decision making by the  Practitioner; and/or   In rare instances, security protocols could fail, causing a breach of personal health information.  Furthermore, I acknowledge that it is my responsibility to provide information about my medical history, conditions and care that is complete and accurate to the best of my ability. I acknowledge that Practitioner's advice, recommendations, and/or decision may be based on factors not within their control, such as incomplete or inaccurate data provided by me or distortions of diagnostic images or specimens that may result from electronic transmissions. I understand that the practice of medicine is not an exact science and that Practitioner makes no warranties or guarantees regarding treatment outcomes. I acknowledge that a copy of this consent can be made available to me via my patient portal ([facility]), or I can request a printed copy by calling the office of [facility].    I understand that my insurance will be billed for this visit.   I have read or had this consent read to me.  I understand the contents of this consent, which adequately explains the benefits and risks of the Services being provided via telemedicine.   I have been provided ample opportunity to ask questions regarding this consent and the Services and have had my questions answered to my satisfaction.  I give my informed consent for the services to be provided through the use of telemedicine in my medical care

## 2020-03-24 ENCOUNTER — Telehealth: Payer: Self-pay | Admitting: Internal Medicine

## 2020-03-24 NOTE — Telephone Encounter (Signed)
Spoke with pharmacy and advised that Dr. Caryl Comes is aware of both medications being prescribed and that the patient has been on both of a number of years. They stated that another physician's office called and also approved of the use of both and that they will note that in patients file.

## 2020-03-24 NOTE — Telephone Encounter (Signed)
Please call regarding drug interaction with Diltiazem and Lovastatin. Please call

## 2020-03-31 ENCOUNTER — Encounter: Payer: Self-pay | Admitting: Physician Assistant

## 2020-03-31 ENCOUNTER — Other Ambulatory Visit: Payer: Self-pay

## 2020-03-31 ENCOUNTER — Telehealth (INDEPENDENT_AMBULATORY_CARE_PROVIDER_SITE_OTHER): Payer: PPO | Admitting: Physician Assistant

## 2020-03-31 VITALS — BP 118/68 | HR 71 | Ht 67.0 in | Wt 166.0 lb

## 2020-03-31 DIAGNOSIS — I10 Essential (primary) hypertension: Secondary | ICD-10-CM | POA: Diagnosis not present

## 2020-03-31 DIAGNOSIS — I480 Paroxysmal atrial fibrillation: Secondary | ICD-10-CM | POA: Diagnosis not present

## 2020-03-31 DIAGNOSIS — R197 Diarrhea, unspecified: Secondary | ICD-10-CM | POA: Diagnosis not present

## 2020-03-31 DIAGNOSIS — R001 Bradycardia, unspecified: Secondary | ICD-10-CM

## 2020-03-31 DIAGNOSIS — I739 Peripheral vascular disease, unspecified: Secondary | ICD-10-CM | POA: Diagnosis not present

## 2020-03-31 DIAGNOSIS — I495 Sick sinus syndrome: Secondary | ICD-10-CM | POA: Diagnosis not present

## 2020-03-31 DIAGNOSIS — Z950 Presence of cardiac pacemaker: Secondary | ICD-10-CM

## 2020-03-31 DIAGNOSIS — E785 Hyperlipidemia, unspecified: Secondary | ICD-10-CM

## 2020-03-31 DIAGNOSIS — Z8673 Personal history of transient ischemic attack (TIA), and cerebral infarction without residual deficits: Secondary | ICD-10-CM

## 2020-03-31 NOTE — Progress Notes (Signed)
Virtual Visit via Telephone Note   This visit type was conducted due to national recommendations for restrictions regarding the COVID-19 Pandemic (e.g. social distancing) in an effort to limit this patient's exposure and mitigate transmission in our community.  Due to his co-morbid illnesses, this patient is at least at moderate risk for complications without adequate follow up.  This format is felt to be most appropriate for this patient at this time.  The patient did not have access to video technology/had technical difficulties with video requiring transitioning to audio format only (telephone).  All issues noted in this document were discussed and addressed.  No physical exam could be performed with this format.  Please refer to the patient's chart for his  consent to telehealth for [facility].   Date:  04/01/2020   ID:  Ernest [name], DOB Dec 05, 1935, MRN [id number]  Patient Location: Home Provider Location: Office/Clinic  PCP:  Derinda Late, MD  Cardiologist:  Ernest Axe, MD  Electrophysiologist:  None   Evaluation Performed:  Follow-Up Visit  Chief Complaint:  84 y.o. male with history of PAF on Eliquis, SSS s/p 08/2015 Medtronic PPM, history of amiodarone toxicity, HTN, HLD, PVD, CVA, dementia, and here today for hospital follow-up.    Chief Complaint  Patient presents with  . Follow-up    [facility] ER for PAF. Meds reviewed by the pt.'s spouse (Ernest [name]) verbally. Pt. c/o rapid irreg. heart beats that lasted about 1 hour last week.       History of Present Illness:    Ernest [name] is a 84 y.o. male with PMH as above.  Of note, the telephone visit today was performed by the patient's wife, given the patient's dementia.  At the time of our visit, the patient was reportedly shouting at the television.  Per patient's wife, this is not an unusual or concerning finding and fairly typical for him, given his dementia.  Pt is is s/p PPM 08/2015 Medtronic and managed on  Eliquis with previous management with amiodarone but discontinued 2/2 toxicity on review of notes.   02/2011 [place] bilateral carotids without significant stenosis.   07/2019 echo was never read for reasons unknown.  Previous 2016 echo showed EF 55-60%, mild concentric hypertrophy, NRWMA, and G1DD. Mild MR and LAE were also noted with PASP 56mmHg.   He underwent 08/2015 PPM implantation for SSS with Medtronic device.   He was last seen by his primary cardiologist Dr. Caryl Comes 09/23/2019. Recommendation was to talk diltiazem at night given his low blood pressure at times. It was noted that if he developed symptomatic hypotension, digoxin or AV junction ablation could be considered.   On 03/20/2020, he presented to [facility] ED with transient CP and weakness. He was noted to be in Afib with RVR and started on diltiazem bolus with initial improvement then cardioversion to NSR. HS Tn and other labs without significant findings. EKG showed Afib ST/T changes attributed to rapid ventricular rate of 166bpm. He was discharged with recommendation for OP follow-up.   Today, 03/31/2020, his wife reports that he is doing about the same as discharge.  He has not reported any chest pain or shortness of breath/dyspnea.  She does note that he is frequently gasping for breath after being in the shower (appearing short of breath/DOE noted by wife) with understanding that this may be 2/2 the increased activity of showering and some element of deconditioning.  No SOB at rest or other times. She is monitoring his oxygen saturation and notes  that this has been doing well with SPO2 96%, 97%, and 98%.  She is trying to get him to start using a walker with a seat to ensure ongoing ambulation; however, she is having difficulty getting him to understand the seat function given his underlying dementia.  She is hesitant to involve home health, given the COVID-19 pandemic, and as she wants to keep his exposure low.  Discussed both home health  and home PT with her preference to defer at this time. He has recently reported some headache with vitals checked and BP 137/74 with heart rate 88.  Daily fluid discussed.  She does note a recent episode of diarrhea, which she states occurs from time to time and then self resolves.  She states this has been going on for some time and not new for him.  She reports that the diarrhea is closer to a softer stool than liquid.  In addition to the HA, she reports a recurrent episode of elevated heart rate, lasting less than an hour, and occurring last night.  She wondered if she should go to the emergency room; however, the elevated rates resolved and they stayed home.  Discussed after hours on-call/office number with her today, if this were to recur in the future, as this may help to guide her regarding whether or not she should go to the emergency room, especially in the setting of COVID-19.  She reports that he has been experiencing ongoing hallucinations; however, she states that this is his usual state and not concerning to her.  No recent presyncope or syncope / falls. No s/sx of bleeding.    The patient does not have symptoms concerning for COVID-19 infection (fever, chills, cough, or new shortness of breath).   Past Medical History:  Diagnosis Date  . Aortic atherosclerosis ([place])   . Arthritis   . BPH (benign prostatic hypertrophy)   . Coronary artery disease    a. Cath 12/2013->[facility]->Med rx.  . Deviated nasal septum   . Diabetes mellitus without complication ([place])    type 2  . Essential hypertension   . GERD (gastroesophageal reflux disease)   . Hearing deficit    wears hearing aids  . Hyperlipidemia   . 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 PAF (paroxysmal atrial fibrillation) ([place])    a. CHA2DS2VASc = 7-->eliquis;  b. 03/2015 Echo: EF 55-60%, Gr1 DD, mild MR, mildly dil LA, PASP 17mmHg.  Marland Kitchen Pneumonia    as a child  . Presence of permanent cardiac pacemaker 08/16/2015  . PVD  (peripheral vascular disease) ([place])   . Rosacea   . Stroke ([place])   . Thyroid disease   . Urine frequency    Past Surgical History:  Procedure Laterality Date  . CARDIAC CATHETERIZATION     [facility] 12/24/13: 30% LM , pLAD, ostial CX. 20% mLAD, mRCA. 70% OM2 (small vessel). Med RX.   Marland Kitchen CARDIAC CATHETERIZATION     [facility]  . CARDIAC CATHETERIZATION     [facility]  . CATARACT EXTRACTION W/ INTRAOCULAR LENS IMPLANT     right  . COLONOSCOPY    . EP IMPLANTABLE DEVICE N/A 08/16/2015   Procedure: Pacemaker Implant;  Surgeon: Deboraha Sprang, MD;  Location: [place] CV LAB;  Service: Cardiovascular;  Laterality: N/A;  . LUMBAR LAMINECTOMY/DECOMPRESSION MICRODISCECTOMY N/A 01/04/2015   Procedure: LUMBAR LAMINECTOMY/DECOMPRESSION MICRODISCECTOMY 3 LEVELS;  Surgeon: Newman Pies, MD;  Location: [place] NEURO ORS;  Service: Neurosurgery;  Laterality: N/A;  L23 L34 L45  laminectomies and foraminotomies  . MULTIPLE TOOTH EXTRACTIONS    . PROSTATE BIOPSY       Current Meds  Medication Sig  . apixaban (ELIQUIS) 5 MG TABS tablet Take 1 tablet (5 mg total) by mouth 2 (two) times daily.  Marland Kitchen diltiazem (CARDIZEM CD) 120 MG 24 hr capsule Take 1 capsule (120 mg total) by mouth daily.  . divalproex (DEPAKOTE ER) 250 MG 24 hr tablet Take 250 mg by mouth daily.   Marland Kitchen glipiZIDE (GLUCOTROL XL) 5 MG 24 hr tablet Take 5 mg by mouth daily with breakfast.   . levothyroxine (SYNTHROID) 125 MCG tablet Take 125 mcg by mouth daily before breakfast.   . LORazepam (ATIVAN) 0.5 MG tablet Take 0.5 mg by mouth daily.  Marland Kitchen lovastatin (MEVACOR) 40 MG tablet Take 40 mg by mouth at bedtime.   . memantine (NAMENDA) 5 MG tablet Take 5 mg by mouth 2 (two) times daily.   . metFORMIN (GLUCOPHAGE) 500 MG tablet Take 1,000 mg by mouth at bedtime.   . metronidazole (NORITATE) 1 % cream Apply 1 application topically daily.  . Multiple Vitamin (MULTIVITAMIN) tablet Take 1 tablet by mouth daily.  . sertraline (ZOLOFT) 25 MG tablet Take 25 mg by mouth  daily.   . tamsulosin (FLOMAX) 0.4 MG CAPS capsule Take 0.4 mg by mouth 2 (two) times daily.     Allergies:   Patient has no known allergies.   Social History   Tobacco Use  . Smoking status: Former Smoker    Types: Cigarettes  . Smokeless tobacco: Never Used  . Tobacco comment: " Quit smoking cigarettes in 1983 "  Vaping Use  . Vaping Use: Former  Substance Use Topics  . Alcohol use: No  . Drug use: No     Family Hx: The patient's family history includes Cancer in his brother; Cancer - Lung in his brother, brother, and sister; Diabetes in his sister; Heart attack in his father and mother.  ROS:   Please see the history of present illness.    Performed via wife. He (or per wife) denies chest pain, pnd, orthopnea, n, v, dizziness, syncope, edema, weight gain, or early satiety.  She has noted work of breathing after showering but otherwise no noted shortness of breath with oxygen saturations stable.  He has had recent diarrhea, which is reportedly an ongoing issue for him and not new.  He has noted a headache.  He had an episode of elevated rates last night that lasted for under an hour. All other systems reviewed and are negative.  Objective:    Vital Signs:  BP 118/68   Pulse 71   Ht 5\' 7"  (1.702 m)   Wt 166 lb (75.3 kg)   BMI 26.00 kg/m    VITAL SIGNS:  reviewed  Accessory clinical findings reviewed:    EKG:  No ECG reviewed.  Previous vitals reviewed today:    Temp Readings from Last 3 Encounters:  03/20/20 97.7 F (36.5 C) (Oral)  08/10/19 97.6 F (36.4 C) (Oral)  11/15/17 97.9 F (36.6 C) (Oral)   BP Readings from Last 3 Encounters:  03/31/20 118/68  03/20/20 129/74  09/23/19 (!) 96/50   Pulse Readings from Last 3 Encounters:  03/31/20 71  03/20/20 61  09/23/19 66    Wt Readings from Last 3 Encounters:  03/31/20 166 lb (75.3 kg)  03/20/20 166 lb (75.3 kg)  09/23/19 166 lb 8 oz (75.5 kg)     Labs reviewed today:  Lab Results  Component  Value Date   WBC 9.9 03/20/2020   HGB 14.9 03/20/2020   HCT 42.9 03/20/2020   MCV 87.4 03/20/2020   PLT 200 03/20/2020   Lab Results  Component Value Date   CREATININE 0.70 03/20/2020   BUN 22 03/20/2020   NA 136 03/20/2020   K 4.2 03/20/2020   CL 102 03/20/2020   CO2 25 03/20/2020   Lab Results  Component Value Date   ALT 20 03/20/2020   AST 18 03/20/2020   ALKPHOS 82 03/20/2020   BILITOT 0.8 03/20/2020   Lab Results  Component Value Date   CHOL 120 08/09/2019   HDL 34 (L) 08/09/2019   LDLCALC 62 08/09/2019   TRIG 119 08/09/2019   CHOLHDL 3.5 08/09/2019    Lab Results  Component Value Date   HGBA1C 8.3 (H) 08/09/2019   Lab Results  Component Value Date   TSH 0.355 08/08/2019     Prior CV Studies reviewed today:    The following studies were reviewed today:  Most recent echo still pending official read.   Echo 2016 - Left ventricle: The cavity size was normal. There was mild  concentric hypertrophy. Systolic function was normal. The  estimated ejection fraction was in the range of 55% to 60%. Wall  motion was normal; there were no regional wall motion  abnormalities. Doppler parameters are consistent with abnormal  left ventricular relaxation (grade 1 diastolic dysfunction).  - Mitral valve: There was mild regurgitation.  - Left atrium: The atrium was mildly dilated.  - Right ventricle: Systolic function was normal.  - Pulmonary arteries: PA peak pressure: 36 mm Hg (S).   ASSESSMENT & PLAN:    Atrial fibrillation, paroxysmal --Since hospital discharge, reports 1 episode of elevated rates, lasting under an hour and occurring last night.  Continue current Cardizem 120 mg daily.  He remains on anticoagulation with Eliquis and has not had any signs or symptoms of bleeding per wife.  As noted in HPI, he is considered amiodarone toxic. Per wife, he is confused at baseline /hallucinations at baseline due to dementia; therefore, confusion is not an  abnormal finding for him.  She has noted some gasping for air when he gets out of the shower but otherwise no shortness of breath at rest and no other observed DOE.  Oxygen saturations have been stable.  Suspect DOE likely 2/2 at least some element of deconditioning. She will continue to monitor.  Encouraged ongoing ambulation with use of a walker with seat if able.  At RTC, consider repeat echo if unable to read the echocardiogram under CV studies.  She will call the office with any recurrent symptoms or if diarrhea persists, as we discussed that this could lead to electrolyte abnormalities.  She is making sure he is hydrated.  Continue current medications and follow-up with Dr. Caryl Comes and EKG at [facility].  Also recommend repeat blood work at [facility], given his diarrhea.    Coronary artery disease, nonobstructive --No reported signs or symptoms concerning for angina.  His wife has observed that he occasionally gasps for air when coming out of the shower. Suspect at least some element of deconditioning as outlined above; however, reasonable to update echocardiogram.  As above, at RTC, consider repeat echo if unable to read the echocardiogram under CV studies (not yet read).  Recommend repeat BMET at RTC given diarrhea as above.  Continue current medications.  At RTC, obtain EKG.  S/p pacemaker, Medtronic --Continue follow-up with EP.  Most recent device check 01/08/2020.  Prior stroke --No recent presyncope/syncope.  No concerning neurologic symptoms.  He remains on anticoagulation as above for PAF.  Gait instability History of PVD --No recent falls/syncope.  No presyncope reported.  She continues to try to get him to use a walker with seat.  For now, she prefers to defer home health/home PT, given the potential for COVID-19 exposure.  Encouraged ongoing physical activity to slow progression of deconditioning. Denies claudication sx. Reassess distal pulses/lower extremity pulses at RTC, as PVD contribute to leg  weakness and symptoms as above.  Ongoing physical activity and cholesterol control recommended.  Dementia --Continue current medications.  His wife is the primary caregiver at home.  She reports ongoing hallucinations, which she states is at his baseline.  Per PCP.  Hypotension --Bps from today reviewed and soft to slightly elevated into the 130s.  No reported presyncope or syncope.  No recent falls.  Continue to monitor.  As previously noted by EP/primary cardiologist, he was advised to take his diltiazem at night to try to mitigate daytime effects of the diltiazem.  If he develops symptomatic hypotension, it was noted that he could consider either digoxin or AV nodal junction ablation.  As noted in HPI, he is considered amiodarone toxic.  No symptomatic hypotension reported at this time.  Continue current medications.  Hyperlipidemia, LDL goal <70 --Most recent LDL 62 and at goal below 70.  Continue current statin.  COVID-19 Education: The signs and symptoms of COVID-19 were discussed with the patient and how to seek care for testing (follow up with PCP or arrange E-visit).  The importance of social distancing was discussed today.  Time:   Today, I have spent 30 minutes with the patient with telehealth technology discussing the above problems.     Medication Adjustments/Labs and Tests Ordered: Current medicines are reviewed at length with the patient today.  Concerns regarding medicines are outlined above.    Disposition: In Person with Dr. Caryl Comes at next available visit. Consider repeat echo, given most recent echo is still not reviewed.  Obtain BMET to recheck renal function and electrolytes given report of diarrhea.  Signed, Arvil Chaco, PA-C  04/01/2020  [facility]

## 2020-03-31 NOTE — Patient Instructions (Signed)
Medication Instructions:  - Your physician recommends that you continue on your current medications as directed. Please refer to the Current Medication list given to you today.  *If you need a refill on your cardiac medications before your next appointment, please call your pharmacy*   Lab Work: - none ordered  If you have labs (blood work) drawn today and your tests are completely normal, you will receive your results only by: Marland Kitchen MyChart Message (if you have MyChart) OR . A paper copy in the mail If you have any lab test that is abnormal or we need to change your treatment, we will call you to review the results.   Testing/Procedures: - none ordered   Follow-Up: At [facility], you and your health needs are our priority.  As part of our continuing mission to provide you with exceptional heart care, we have created designated Provider Care Teams.  These Care Teams include your primary Cardiologist (physician) and Advanced Practice Providers (APPs -  Physician Assistants and Nurse Practitioners) who all work together to provide you with the care you need, when you need it.  We recommend signing up for the patient portal called "MyChart".  Sign up information is provided on this After Visit Summary.  MyChart is used to connect with patients for Virtual Visits (Telemedicine).  Patients are able to view lab/test results, encounter notes, upcoming appointments, etc.  Non-urgent messages can be sent to your provider as well.   To learn more about what you can do with MyChart, go to [email or web address].    Your next appointment:   Next available for you & your wife (same day appointments)    The format for your next appointment:   In Person  Provider:   Virl Axe, MD   Other Instructions You can always call the office at [phone] for after hours concerns if you aren't sure if you should go to the ED in the future?

## 2020-04-08 ENCOUNTER — Ambulatory Visit (INDEPENDENT_AMBULATORY_CARE_PROVIDER_SITE_OTHER): Payer: PPO

## 2020-04-08 DIAGNOSIS — I495 Sick sinus syndrome: Secondary | ICD-10-CM | POA: Diagnosis not present

## 2020-04-10 LAB — CUP PACEART REMOTE DEVICE CHECK
Battery Remaining Longevity: 74 mo
Battery Voltage: 3.01 V
Brady Statistic AP VP Percent: 0.03 %
Brady Statistic AP VS Percent: 36.84 %
Brady Statistic AS VP Percent: 0.02 %
Brady Statistic AS VS Percent: 63.11 %
Brady Statistic RA Percent Paced: 36.7 %
Brady Statistic RV Percent Paced: 0.06 %
Date Time Interrogation Session: 20210930123337
Implantable Lead Implant Date: 20170206
Implantable Lead Implant Date: 20170206
Implantable Lead Location: 753859
Implantable Lead Location: 753860
Implantable Lead Model: 5076
Implantable Lead Model: 5076
Implantable Pulse Generator Implant Date: 20170206
Lead Channel Impedance Value: 380 Ohm
Lead Channel Impedance Value: 399 Ohm
Lead Channel Impedance Value: 418 Ohm
Lead Channel Impedance Value: 456 Ohm
Lead Channel Pacing Threshold Amplitude: 0.375 V
Lead Channel Pacing Threshold Amplitude: 0.75 V
Lead Channel Pacing Threshold Pulse Width: 0.4 ms
Lead Channel Pacing Threshold Pulse Width: 0.4 ms
Lead Channel Sensing Intrinsic Amplitude: 1.625 mV
Lead Channel Sensing Intrinsic Amplitude: 1.625 mV
Lead Channel Sensing Intrinsic Amplitude: 7.5 mV
Lead Channel Sensing Intrinsic Amplitude: 7.5 mV
Lead Channel Setting Pacing Amplitude: 2 V
Lead Channel Setting Pacing Amplitude: 2.5 V
Lead Channel Setting Pacing Pulse Width: 0.4 ms
Lead Channel Setting Sensing Sensitivity: 2.8 mV

## 2020-04-12 NOTE — Progress Notes (Signed)
Remote pacemaker transmission.   

## 2020-06-10 ENCOUNTER — Telehealth: Payer: Self-pay | Admitting: Internal Medicine

## 2020-06-10 NOTE — Telephone Encounter (Signed)
  Patient Consent for Virtual Visit         Ernest [name] has provided verbal consent on 06/10/2020 for a virtual visit (video or telephone).   CONSENT FOR VIRTUAL VISIT FOR:  Ernest [name]  By participating in this virtual visit I agree to the following:  I hereby voluntarily request, consent and authorize [facility] and its employed or contracted physicians, physician assistants, nurse practitioners or other licensed health care professionals (the Practitioner), to provide me with telemedicine health care services (the "Services") as deemed necessary by the treating Practitioner. I acknowledge and consent to receive the Services by the Practitioner via telemedicine. I understand that the telemedicine visit will involve communicating with the Practitioner through live audiovisual communication technology and the disclosure of certain medical information by electronic transmission. I acknowledge that I have been given the opportunity to request an in-person assessment or other available alternative prior to the telemedicine visit and am voluntarily participating in the telemedicine visit.  I understand that I have the right to withhold or withdraw my consent to the use of telemedicine in the course of my care at any time, without affecting my right to future care or treatment, and that the Practitioner or I may terminate the telemedicine visit at any time. I understand that I have the right to inspect all information obtained and/or recorded in the course of the telemedicine visit and may receive copies of available information for a reasonable fee.  I understand that some of the potential risks of receiving the Services via telemedicine include:  Marland Kitchen Delay or interruption in medical evaluation due to technological equipment failure or disruption; . Information transmitted may not be sufficient (e.g. poor resolution of images) to allow for appropriate medical decision making by the  Practitioner; and/or  . In rare instances, security protocols could fail, causing a breach of personal health information.  Furthermore, I acknowledge that it is my responsibility to provide information about my medical history, conditions and care that is complete and accurate to the best of my ability. I acknowledge that Practitioner's advice, recommendations, and/or decision may be based on factors not within their control, such as incomplete or inaccurate data provided by me or distortions of diagnostic images or specimens that may result from electronic transmissions. I understand that the practice of medicine is not an exact science and that Practitioner makes no warranties or guarantees regarding treatment outcomes. I acknowledge that a copy of this consent can be made available to me via my patient portal ([place]), or I can request a printed copy by calling the office of [facility].    I understand that my insurance will be billed for this visit.   I have read or had this consent read to me. . I understand the contents of this consent, which adequately explains the benefits and risks of the Services being provided via telemedicine.  . I have been provided ample opportunity to ask questions regarding this consent and the Services and have had my questions answered to my satisfaction. . I give my informed consent for the services to be provided through the use of telemedicine in my medical care

## 2020-06-15 ENCOUNTER — Other Ambulatory Visit: Payer: Self-pay

## 2020-06-15 ENCOUNTER — Encounter: Payer: Self-pay | Admitting: Internal Medicine

## 2020-06-15 ENCOUNTER — Telehealth (INDEPENDENT_AMBULATORY_CARE_PROVIDER_SITE_OTHER): Payer: PPO | Admitting: Internal Medicine

## 2020-06-15 VITALS — BP 122/63 | HR 69 | Ht 67.0 in | Wt 166.0 lb

## 2020-06-15 DIAGNOSIS — I495 Sick sinus syndrome: Secondary | ICD-10-CM

## 2020-06-15 DIAGNOSIS — Z950 Presence of cardiac pacemaker: Secondary | ICD-10-CM

## 2020-06-15 DIAGNOSIS — R001 Bradycardia, unspecified: Secondary | ICD-10-CM

## 2020-06-15 DIAGNOSIS — I480 Paroxysmal atrial fibrillation: Secondary | ICD-10-CM | POA: Diagnosis not present

## 2020-06-15 DIAGNOSIS — I10 Essential (primary) hypertension: Secondary | ICD-10-CM

## 2020-06-15 MED ORDER — APIXABAN 5 MG PO TABS
5.0000 mg | ORAL_TABLET | Freq: Two times a day (BID) | ORAL | 1 refills | Status: DC
Start: 2020-06-15 — End: 2020-12-13

## 2020-06-15 NOTE — Progress Notes (Signed)
Electrophysiology TeleHealth Note   Due to national recommendations of social distancing due to COVID 19, an audio/video telehealth visit is felt to be most appropriate for this patient at this time.  See MyChart message from today for the patient's consent to telehealth for [facility].   Date:  06/15/2020   ID:  Ernest [name], DOB [birth year], MRN [id number]  Location: patient's home  Provider location: [address] [place]  Evaluation Performed: Follow-up visit  PCP:  Derinda Late, MD  Cardiologist:   BAE Electrophysiologist:  SK   Chief Complaint:  Afib and sinus node dysfunction   History of Present Illness:    Ernest [name] is a 84 y.o. male who presents via audio/video conferencing for a telehealth visit today.  Since last being seen in our clinic atrial fibrillation-paroxysmal with rapid ventricular response associated with sinus bradycardia  status post pacemaker 2/17 [employer]tr[employer] anticoagulation with apixaban and significant deterioration in status secondary to dementia, the patient wife reports his dementia continues to worsen.  Medications have been intercurrently added.  Has had some interval rapid heart rates.  No clear associated symptoms.  Notes blood pressure is often in the mid to high 90s.  Does not seem to make a functional difference   DATE TEST EF   2015 Cath  70% Marginal  9/16 echo  55-60 mild LAE   1/21 Echo   NOT READ          Date TSH Cr K Hgb  9/17  3.98      12/17  4.12      12/18 5.016 0.8 4.3 14.3  7/19 4.462 0.8 4.5 14.5  6/20 1.96 0.9 4.2 14.7  1/21 0.355 0.67 4.1 13.5  9/21 2.78 (5/21) (CE)    0.7 4.2 14.9      The patient denies symptoms of fevers, chills, cough, or new SOB worrisome for COVID 19.    Past Medical History:  Diagnosis Date  . Aortic atherosclerosis ([place])   . Arthritis   . BPH (benign prostatic hypertrophy)   . Coronary artery disease    a. Cath 12/2013->[facility]->Med rx.  .  Deviated nasal septum   . Diabetes mellitus without complication ([place])    type 2  . Essential hypertension   . GERD (gastroesophageal reflux disease)   . Hearing deficit    wears hearing aids  . Hyperlipidemia   . 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 PAF (paroxysmal atrial fibrillation) ([place])    a. CHA2DS2VASc = 7-->eliquis;  b. 03/2015 Echo: EF 55-60%, Gr1 DD, mild MR, mildly dil LA, PASP 96mmHg.  Marland Kitchen Pneumonia    as a child  . Presence of permanent cardiac pacemaker 08/16/2015  . PVD (peripheral vascular disease) ([place])   . Rosacea   . Stroke ([place])   . Thyroid disease   . Urine frequency     Past Surgical History:  Procedure Laterality Date  . CARDIAC CATHETERIZATION     [facility] 12/24/13: 30% LM , pLAD, ostial CX. 20% mLAD, mRCA. 70% OM2 (small vessel). Med RX.   Marland Kitchen CARDIAC CATHETERIZATION     [facility]  . CARDIAC CATHETERIZATION     [facility]  . CATARACT EXTRACTION W/ INTRAOCULAR LENS IMPLANT     right  . COLONOSCOPY    . EP IMPLANTABLE DEVICE N/A 08/16/2015   Procedure: Pacemaker Implant;  Surgeon: Deboraha Sprang, MD;  Location: [place] CV LAB;  Service: Cardiovascular;  Laterality: N/A;  .  LUMBAR LAMINECTOMY/DECOMPRESSION MICRODISCECTOMY N/A 01/04/2015   Procedure: LUMBAR LAMINECTOMY/DECOMPRESSION MICRODISCECTOMY 3 LEVELS;  Surgeon: Newman Pies, MD;  Location: [place] NEURO ORS;  Service: Neurosurgery;  Laterality: N/A;  L23 L34 L45 laminectomies and foraminotomies  . MULTIPLE TOOTH EXTRACTIONS    . PROSTATE BIOPSY      Current Outpatient Medications  Medication Sig Dispense Refill  . apixaban (ELIQUIS) 5 MG TABS tablet Take 1 tablet (5 mg total) by mouth 2 (two) times daily. 180 tablet 1  . diltiazem (CARDIZEM CD) 120 MG 24 hr capsule Take 1 capsule (120 mg total) by mouth daily. 90 capsule 3  . divalproex (DEPAKOTE ER) 250 MG 24 hr tablet Take 250 mg by mouth daily.     Marland Kitchen glipiZIDE (GLUCOTROL XL) 5 MG 24 hr tablet Take 5 mg by mouth daily with breakfast.     . levothyroxine  (SYNTHROID) 125 MCG tablet Take 125 mcg by mouth daily before breakfast.     . LORazepam (ATIVAN) 0.5 MG tablet Take 0.5 mg by mouth daily.    Marland Kitchen lovastatin (MEVACOR) 40 MG tablet Take 40 mg by mouth at bedtime.     . memantine (NAMENDA) 5 MG tablet Take 5 mg by mouth 2 (two) times daily.     . metFORMIN (GLUCOPHAGE) 500 MG tablet Take 1,000 mg by mouth at bedtime.     . metronidazole (NORITATE) 1 % cream Apply 1 application topically daily.    . Multiple Vitamin (MULTIVITAMIN) tablet Take 1 tablet by mouth daily.    . QUEtiapine (SEROQUEL) 25 MG tablet Take 25 mg by mouth at bedtime.    . sertraline (ZOLOFT) 25 MG tablet Take 25 mg by mouth daily.     . tamsulosin (FLOMAX) 0.4 MG CAPS capsule Take 0.4 mg by mouth 2 (two) times daily.     No current facility-administered medications for this visit.    Allergies:   Patient has no known allergies.   Social History:  The patient  reports that he has quit smoking. His smoking use included cigarettes. He has never used smokeless tobacco. He reports that he does not drink alcohol and does not use drugs.   Family History:  The patient's   family history includes Cancer in his brother; Cancer - Lung in his brother, brother, and sister; Diabetes in his sister; Heart attack in his father and mother.   ROS:  Please see the history of present illness.   All other systems are personally reviewed and negative.    Exam:    Vital Signs:  BP 122/63   Pulse 69   Ht 5\' 7"  (1.702 m)   Wt 166 lb (75.3 kg)   BMI 26.00 kg/m     Labs/Other Tests and Data Reviewed:    Recent Labs: 08/08/2019: TSH 0.355 03/20/2020: ALT 20; B Natriuretic Peptide 64.8; BUN 22; Creatinine, Ser 0.70; Hemoglobin 14.9; Magnesium 1.9; Platelets 200; Potassium 4.2; Sodium 136   Wt Readings from Last 3 Encounters:  06/15/20 166 lb (75.3 kg)  03/31/20 166 lb (75.3 kg)  03/20/20 166 lb (75.3 kg)     Other studies personally reviewed: Additional studies/ records that were  reviewed today include: (As above)     Last device remote is reviewed from [facility] PDF dated 10/21 which reveals normal device function,   arrhythmias -intermittent atrial fibrillation with reasonably controlled rates and spontaneously terminating   ASSESSMENT & PLAN:   Atrial fibrillation-paroxysmal  Coronary artery disease- nonobstructive  Pacemaker-Medtronic     Prior stroke  Sinus bradycardia  Gait instability   Dementia  Hypotension    Infrequent interval atrial fibrillation.  While that is associated with a rapid rate, the infrequency and the spontaneous terminations prompt me in the context of his hypotension to recommend that we stop his diltiazem and use it on an as-needed basis.  Mrs. Pinkerton is agreeable.  Device function is normal.   No clinical bleeding on the Eliquis.  As noted no clear symptoms associated with his hypotension (relative); however, given its running in the 90s, we will stop it so as to allow it to drift up.  I am not sure of an association between blood pressure and dementia but I would presume of one  Have also encouraged her to talk with Dr. Jimmie Molly again about palliative care support and encouraged her to not be "  a stubborn old woman like my mother "   COVID 57 screen The patient denies symptoms of COVID 19 at this time.  The importance of social distancing was discussed today.  Follow-up:  84m Next remote: As Scheduled   Current medicines are reviewed at length with the patient today.   The patient does not have concerns regarding his medicines.  The following changes were made today:  Stop diltiazem and use it as needed   Labs/ tests ordered today include:  No orders of the defined types were placed in this encounter.   Future tests ( post COVID )     Patient Risk:  after full review of this patients clinical status, I feel that they are at moderate risk at this time.  Today, I have spent 9 minutes with the patient with  telehealth technology discussing the above.  Signed, Virl Axe, MD  06/15/2020 10:15 AM     [address]  [id number] [phone] (office) [phone] (fax) Was is let you them are I did not assume that-HIV sometimes your) question before if if see reply

## 2020-06-15 NOTE — Patient Instructions (Signed)
Medication Instructions:  - Your physician recommends that you continue on your current medications as directed. Please refer to the Current Medication list given to you today.  *If you need a refill on your cardiac medications before your next appointment, please call your pharmacy*   Lab Work: - none ordered  If you have labs (blood work) drawn today and your tests are completely normal, you will receive your results only by: Marland Kitchen MyChart Message (if you have MyChart) OR . A paper copy in the mail If you have any lab test that is abnormal or we need to change your treatment, we will call you to review the results.   Testing/Procedures: - none ordered   Follow-Up: At [facility] you and your health needs are our priority.  As part of our continuing mission to provide you with exceptional heart care, we have created designated Provider Care Teams.  These Care Teams include your primary Cardiologist (physician) and Advanced Practice Providers (APPs -  Physician Assistants and Nurse Practitioners) who all work together to provide you with the care you need, when you need it.  We recommend signing up for the patient portal called "MyChart".  Sign up information is provided on this After Visit Summary.  MyChart is used to connect with patients for Virtual Visits (Telemedicine).  Patients are able to view lab/test results, encounter notes, upcoming appointments, etc.  Non-urgent messages can be sent to your provider as well.   To learn more about what you can do with MyChart, go to [email or web address].    Your next appointment:   6 month(s)  The format for your next appointment:   Virtual Visit   Provider:   Virl Axe, MD   Other Instructions n/a

## 2020-06-30 ENCOUNTER — Telehealth: Payer: Self-pay | Admitting: Internal Medicine

## 2020-06-30 NOTE — Telephone Encounter (Signed)
Pt c/o medication issue:  1. Name of Medication: diltiazem   2. How are you currently taking this medication (dosage and times per day)?  Everyday   3. Are you having a reaction (difficulty breathing--STAT)? no  4. What is your medication issue? Patient wife states she was told to only give prn but patient has continued to need it everyday with sundowning episodes   Please call to discuss .  She is not sure she is doing the right thing

## 2020-07-01 ENCOUNTER — Telehealth: Payer: Self-pay

## 2020-07-01 ENCOUNTER — Other Ambulatory Visit: Payer: Self-pay

## 2020-07-01 MED ORDER — DILTIAZEM HCL ER COATED BEADS 120 MG PO CP24
120.0000 mg | ORAL_CAPSULE | ORAL | 3 refills | Status: DC | PRN
Start: 2020-07-01 — End: 2021-01-15

## 2020-07-01 NOTE — Telephone Encounter (Signed)
Return pt wife's call, Ricka Burdock, she had originally reach out to [employer] regarding pt's "really bad sundowning spells" every night.  She states he gets so "intense" on what he is doing in the evenings and wants to "go home." His HR are elevated up to 103-113 during these episodes. Advised Cornellia that Dr. Caryl Comes advised   "The biggest risk is a fall 2/2 low blodd pressure  would stop dilt and use if HR > 140 Bpm  otherwise let it be And have her followup with PCP about some help with his sundowning"  Cornellia verbalized understanding and will stop the diltiazem and only use when HR is greater than 140, advised that if pt is having to take the [facility] daily/regulary for HR greater then 140 then to reach back out for further guidance, wife stated that she has been in touch with pt's PCP about his sundowning and have been adjusting his medications. Otherwise all questions or concerns were address and no additional concerns at this time. Agreeable to plan, will call back for anything further.

## 2020-07-01 NOTE — Telephone Encounter (Signed)
Late entry-   I spoke with Mrs. Ditommaso yesterday afternoon ~ 5:20 pm. She had called to report that Mr. Bollig is having " really bad sundowning spells" every night.  She states he gets so "intense" on what he is doing in the evenings and wants to "go home." His HR are elevated up to 103-113 during these episodes.  Mrs. Schnitzer advised she resumed giving the patient diltiazem 120 mg once daily in the mornings not long after the patient's telehealth appointment with Dr. Caryl Comes to help with this.   Per Dr. Olin Pia telehealth note on 06/15/20,  "Infrequent interval atrial fibrillation.  While that is associated with a rapid rate, the infrequency and the spontaneous terminations prompt me in the context of his hypotension to recommend that we stop his diltiazem and use it on an as-needed basis.  Mrs. Voorhies is agreeable."  I inquired what other BP/ HR readings have been taken. 12/15- 77/47 (61) 12/20- 104/58 (113) 12/21- patient would allow a BP, but HR by pulse ox were (77-103)   I advised Mrs. Price that I will need to speak with Dr. Caryl Comes and obtain further recommendations as the concern is potential falls from hypotension. She is aware I will call back and she is agreeable.

## 2020-07-01 NOTE — Telephone Encounter (Signed)
The biggest risk is a fall 2/2 low blodd pressure  would stop dilt and use if HR > 140 Bpm  otherwise let it be And have her followup with PCP about some help with his sundowning  Thanks SK

## 2020-07-08 ENCOUNTER — Telehealth: Payer: Self-pay | Admitting: Internal Medicine

## 2020-07-08 NOTE — Telephone Encounter (Signed)
Patients wife calling in after transmission attempt was made and patient was given code "3230"  Please advise patient on what to do

## 2020-07-08 NOTE — Telephone Encounter (Signed)
The patient wife tried to send a transmission but received the 3230. I called tech support for additional help. The patient is receiving a new handheld in 7-10 business days.

## 2020-07-12 NOTE — Telephone Encounter (Signed)
I called and spoke with Mrs. Tally about Dr. Odessa Fleming recommendations as stated below.  Mrs. Tally voices understanding and is agreeable. The patient is due for a follow up with his PCP next Monday (she believes) and will discuss his dementia/ sun downing further with him at that time.   I have advised her to please take care of herself as well. She states she has been thinking about getting some help as she feels like she is now ready for that, although she has been ok up to this point.  I have asked her to reach out with any further questions/ concerns.

## 2020-07-14 DIAGNOSIS — Z79899 Other long term (current) drug therapy: Secondary | ICD-10-CM | POA: Diagnosis not present

## 2020-07-14 DIAGNOSIS — E1165 Type 2 diabetes mellitus with hyperglycemia: Secondary | ICD-10-CM | POA: Diagnosis not present

## 2020-07-14 DIAGNOSIS — E039 Hypothyroidism, unspecified: Secondary | ICD-10-CM | POA: Diagnosis not present

## 2020-07-14 DIAGNOSIS — I700 Atherosclerosis of aorta: Secondary | ICD-10-CM | POA: Diagnosis not present

## 2020-07-22 DIAGNOSIS — F0281 Dementia in other diseases classified elsewhere with behavioral disturbance: Secondary | ICD-10-CM | POA: Diagnosis not present

## 2020-07-22 DIAGNOSIS — I2510 Atherosclerotic heart disease of native coronary artery without angina pectoris: Secondary | ICD-10-CM | POA: Diagnosis not present

## 2020-07-22 DIAGNOSIS — I739 Peripheral vascular disease, unspecified: Secondary | ICD-10-CM | POA: Diagnosis not present

## 2020-07-22 DIAGNOSIS — E039 Hypothyroidism, unspecified: Secondary | ICD-10-CM | POA: Diagnosis not present

## 2020-07-22 DIAGNOSIS — I700 Atherosclerosis of aorta: Secondary | ICD-10-CM | POA: Diagnosis not present

## 2020-07-22 DIAGNOSIS — I480 Paroxysmal atrial fibrillation: Secondary | ICD-10-CM | POA: Diagnosis not present

## 2020-07-22 DIAGNOSIS — I10 Essential (primary) hypertension: Secondary | ICD-10-CM | POA: Diagnosis not present

## 2020-07-22 DIAGNOSIS — G301 Alzheimer's disease with late onset: Secondary | ICD-10-CM | POA: Diagnosis not present

## 2020-07-22 DIAGNOSIS — Z79899 Other long term (current) drug therapy: Secondary | ICD-10-CM | POA: Diagnosis not present

## 2020-07-22 DIAGNOSIS — E1165 Type 2 diabetes mellitus with hyperglycemia: Secondary | ICD-10-CM | POA: Diagnosis not present

## 2020-08-17 ENCOUNTER — Telehealth: Payer: Self-pay | Admitting: Nurse Practitioner

## 2020-08-17 NOTE — Telephone Encounter (Signed)
Spoke with wife, Cornelia, regarding Palliative referral/services and all questions were answered and she was in agreement with scheduling visit with NP.  I have scheduled an In-home Consult for 08/26/20 @ 9 AM.

## 2020-08-26 ENCOUNTER — Other Ambulatory Visit: Payer: PPO | Admitting: Nurse Practitioner

## 2020-08-26 ENCOUNTER — Encounter: Payer: Self-pay | Admitting: Nurse Practitioner

## 2020-08-26 ENCOUNTER — Other Ambulatory Visit: Payer: Self-pay

## 2020-08-26 DIAGNOSIS — Z515 Encounter for palliative care: Secondary | ICD-10-CM

## 2020-08-26 DIAGNOSIS — F0281 Dementia in other diseases classified elsewhere with behavioral disturbance: Secondary | ICD-10-CM

## 2020-08-26 DIAGNOSIS — G309 Alzheimer's disease, unspecified: Secondary | ICD-10-CM
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
## 2020-08-30 ENCOUNTER — Ambulatory Visit (INDEPENDENT_AMBULATORY_CARE_PROVIDER_SITE_OTHER): Payer: PPO

## 2020-08-30 DIAGNOSIS — I495 Sick sinus syndrome: Secondary | ICD-10-CM | POA: Diagnosis not present

## 2020-08-31 LAB — CUP PACEART REMOTE DEVICE CHECK
Battery Remaining Longevity: 63 mo
Battery Voltage: 3 V
Brady Statistic AP VP Percent: 0.02 %
Brady Statistic AP VS Percent: 29.76 %
Brady Statistic AS VP Percent: 0.03 %
Brady Statistic AS VS Percent: 70.18 %
Brady Statistic RA Percent Paced: 28.94 %
Brady Statistic RV Percent Paced: 0.06 %
Date Time Interrogation Session: 20220218162105
Implantable Lead Implant Date: 20170206
Implantable Lead Implant Date: 20170206
Implantable Lead Location: 753859
Implantable Lead Location: 753860
Implantable Lead Model: 5076
Implantable Lead Model: 5076
Implantable Pulse Generator Implant Date: 20170206
Lead Channel Impedance Value: 361 Ohm
Lead Channel Impedance Value: 380 Ohm
Lead Channel Impedance Value: 418 Ohm
Lead Channel Impedance Value: 418 Ohm
Lead Channel Pacing Threshold Amplitude: 0.5 V
Lead Channel Pacing Threshold Amplitude: 0.75 V
Lead Channel Pacing Threshold Pulse Width: 0.4 ms
Lead Channel Pacing Threshold Pulse Width: 0.4 ms
Lead Channel Sensing Intrinsic Amplitude: 1.875 mV
Lead Channel Sensing Intrinsic Amplitude: 1.875 mV
Lead Channel Sensing Intrinsic Amplitude: 7.25 mV
Lead Channel Sensing Intrinsic Amplitude: 7.25 mV
Lead Channel Setting Pacing Amplitude: 2 V
Lead Channel Setting Pacing Amplitude: 2.5 V
Lead Channel Setting Pacing Pulse Width: 0.4 ms
Lead Channel Setting Sensing Sensitivity: 2.8 mV

## 2020-09-02 NOTE — Progress Notes (Signed)
Remote pacemaker transmission.   

## 2020-09-23 ENCOUNTER — Other Ambulatory Visit: Payer: PPO | Admitting: Nurse Practitioner

## 2020-09-23 ENCOUNTER — Encounter: Payer: Self-pay | Admitting: Nurse Practitioner

## 2020-09-23 ENCOUNTER — Other Ambulatory Visit: Payer: Self-pay

## 2020-09-23 DIAGNOSIS — G309 Alzheimer's disease, unspecified: Secondary | ICD-10-CM | POA: Diagnosis not present

## 2020-09-23 DIAGNOSIS — Z515 Encounter for palliative care: Secondary | ICD-10-CM

## 2020-09-23 DIAGNOSIS — F0281 Dementia in other diseases classified elsewhere with behavioral disturbance: Secondary | ICD-10-CM | POA: Diagnosis not present

## 2020-09-23 NOTE — Progress Notes (Signed)
[facility] Consult Note Telephone: [phone]  Fax: [phone]  PATIENT NAME: Ernest [name] DOB: 10/27/1935 MRN: [id number]  PRIMARY CARE PROVIDER:   Derinda Late, MD  REFERRING PROVIDER:  Derinda Late, MD [phone] [address] [facility] [place],  [place] [address]  RESPONSIBLE PARTY:   Mrs. Clymer, wife  1.Advance Care Planning;DNR and MOST form completed placed in vynca with wishes DNR, Limited additional interventions, wishes IVF, antibiotics; no feeding tube  2. Goals of Care: Goals include to maximize quality of life and symptom management. Our advance care planning conversation included a discussion about:   The value and importance of advance care planning  Exploration of personal, cultural or spiritual beliefs that might influence medical decisions  Exploration of goals of care in the event of a sudden injury or illness  Identification and preparation of a healthcare agent  Review and updating or creation of anadvance directive document.  3.Palliative care encounter; Palliative care encounter; Palliative medicine team will continue to support patient, patient's family, and medical team. Visit consisted of counseling and education dealing with the complex and emotionally intense issues of symptom management and palliative care in the setting of serious and potentially life-threatening illness  4. f/u1 month for ongoing monitoring chronic disease progression, ongoing discussions complex medical decision making  I spent 70 minutes providing this consultation,  from 1:00pm to 2:10pm. More than 50% of the time in this consultation was spent coordinating communication.   HISTORY OF PRESENT ILLNESS:  Ernest [name] is a 85 y.o. year old male with multiple medical problems including Alzheimer's Dementia, afib, DM, HLD, hypothyroidism, CAD, aortic atherosclerosis, PVD,  HTN, lumbar stenosis, pulmonary nodule, BPH. I called Ms. Meskill to confirm f/u PC visit and covid screening negative. I visited Mrs and Mr. Melrose in their home. Mr. Harty was lying in the recliner asleep. Mr. Molstad awoke to verbal cues, made eye contact, smiling. Mr. Galant went back to sleep. Mrs. Godshall and I talked about how Mr. Agena has been doing. Mrs. Scalzo endorses it has been more difficult as Mr. Nez has been urinating on the carpet. We talked about Mr. Pittmon does wear adult diapers, like to come into the living room without clothes. We talked about toilet training. Ms. Kliethermes does attempt. We talked about symptoms of pain which he does not seem to exhibit. We talked about appetite which remains poor. He eats few bites at breakfast, lunch and dinner is a more difficult meal. Mrs. Morrell endorses she weighted him yesterday 154lbs down from 172lbs over the last few months. We talked about supplements. We talked about chronic disease progression of dementia. We talked about nutrition. We talked about comfort foods. Mrs. Laverne endorses she has not been able to take Mr. Molina to a restaurant over the last week as it has become more difficulty to redirect him with urinating. We talked about medical goals of care. We talked about goals to keep Mr. Isaac at home, comfortable. We talked about option of Hospice services under medicare benefit. We talked about what services are provided. Mrs. Galan endorses she would like to wait before having Hospice or case reviewed by Hospice physicians. We talked about slow disease progression with natural aging. We talked about caregiver fatigue, stress, burnout. We talked about coping strategies. We talked about Mrs. Weatherman support system, her brother, family. We talked about activities that Mr. Bitter likes to do such as moving furniture, lining  up chairs, going through cabinets, playing with toy cars, cards. Mrs. Zuercher endorses it usually does not last to long,  Mr. Fussell get bored with activities. Mrs Delman endorses she continues to find him on the floor, he just sits down. Mrs. Schertzer does take pictures and videos of behaviors. We talked about behaviors, redirecting. Mrs. Sheu endorses she has been using melatonin 1/2 tablet of 3mg  when Mr. Vegh is extremely active, difficult to manage behaviors. Mr. Dorn has not wandered since last PC visit. Mrs. Ratchford endorses he has been sleeping better at night, though does continue to get up. Mrs. Westergard endorses she does give him a 3mg  melatonin at bedtime which helps him sleep. Mrs. Ehle endorses Mr. Braniff does nap during the day. We talked about role PC in poc. Discussed f/u pc visit 4 weeks for ongoing monitoring disease progression, weight, appetite, behaviors. Mrs. Santistevan in agreement. Appointment scheduled. Therapeutic listening, emotional support provided. Questions answered. Contact information. Mrs. Cottone verbalized understanding she can call should Mr. Hedman decline.   Palliative Care was asked to help to continue to address goals of care.   CODE STATUS: DNR  PPS: 50% HOSPICE ELIGIBILITY/DIAGNOSIS: TBD  PAST MEDICAL HISTORY:  Past Medical History:  Diagnosis Date  . Aortic atherosclerosis ([facility])   . Arthritis   . BPH (benign prostatic hypertrophy)   . Coronary artery disease    a. Cath 12/2013->[facility]->Med rx.  . Deviated nasal septum   . Diabetes mellitus without complication ([facility])    type 2  . Essential hypertension   . GERD (gastroesophageal reflux disease)   . Hearing deficit    wears hearing aids  . Hyperlipidemia   . 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 PAF (paroxysmal atrial fibrillation) ([place])    a. CHA2DS2VASc = 7-->eliquis;  b. 03/2015 Echo: EF 55-60%, Gr1 DD, mild MR, mildly dil LA, PASP 55mmHg.  Marland Kitchen Pneumonia    as a child  . Presence of permanent cardiac pacemaker 08/16/2015  . PVD (peripheral vascular disease) ([facility])   . Rosacea   . Stroke ([facility])   . Thyroid  disease   . Urine frequency     SOCIAL HX:  Social History   Tobacco Use  . Smoking status: Former Smoker    Types: Cigarettes  . Smokeless tobacco: Never Used  . Tobacco comment: " Quit smoking cigarettes in 1983 "  Substance Use Topics  . Alcohol use: No    ALLERGIES: No Known Allergies   PERTINENT MEDICATIONS:  Outpatient Encounter Medications as of 09/23/2020  Medication Sig  . apixaban (ELIQUIS) 5 MG TABS tablet Take 1 tablet (5 mg total) by mouth 2 (two) times daily.  Marland Kitchen diltiazem (CARDIZEM CD) 120 MG 24 hr capsule Take 1 capsule (120 mg total) by mouth as needed (Take as needed for HR greater than 140 bpm). Take as needed for HR greater than 140 bpm  . divalproex (DEPAKOTE ER) 250 MG 24 hr tablet Take 250 mg by mouth daily.   Marland Kitchen glipiZIDE (GLUCOTROL XL) 5 MG 24 hr tablet Take 5 mg by mouth daily with breakfast.   . levothyroxine (SYNTHROID) 125 MCG tablet Take 125 mcg by mouth daily before breakfast.   . LORazepam (ATIVAN) 0.5 MG tablet Take 0.5 mg by mouth daily.  Marland Kitchen lovastatin (MEVACOR) 40 MG tablet Take 40 mg by mouth at bedtime.   . memantine (NAMENDA) 5 MG tablet Take 5 mg by mouth 2 (two) times daily.   . metFORMIN (GLUCOPHAGE)  500 MG tablet Take 1,000 mg by mouth at bedtime.   . metronidazole (NORITATE) 1 % cream Apply 1 application topically daily.  . Multiple Vitamin (MULTIVITAMIN) tablet Take 1 tablet by mouth daily.  . QUEtiapine (SEROQUEL) 25 MG tablet Take 25 mg by mouth at bedtime.  . sertraline (ZOLOFT) 25 MG tablet Take 25 mg by mouth daily.   . tamsulosin (FLOMAX) 0.4 MG CAPS capsule Take 0.4 mg by mouth 2 (two) times daily.   No facility-administered encounter medications on file as of 09/23/2020.    PHYSICAL EXAM:   General: confused, frail appearing, thin male Cardiovascular: regular rate and rhythm Pulmonary: clear ant fields Neurological: ambulatory with unsteady gait  Christin Ihor Gully, NP

## 2020-09-24 ENCOUNTER — Emergency Department
Admission: EM | Admit: 2020-09-24 | Discharge: 2020-09-24 | Disposition: A | Discharge status: Home / Self Care | Payer: PPO | Attending: Emergency Medicine | Admitting: Emergency Medicine

## 2020-09-24 ENCOUNTER — Other Ambulatory Visit: Payer: Self-pay

## 2020-09-24 DIAGNOSIS — F0281 Dementia in other diseases classified elsewhere with behavioral disturbance: Secondary | ICD-10-CM | POA: Insufficient documentation

## 2020-09-24 DIAGNOSIS — R404 Transient alteration of awareness: Secondary | ICD-10-CM

## 2020-09-24 DIAGNOSIS — G309 Alzheimer's disease, unspecified: Secondary | ICD-10-CM | POA: Diagnosis not present

## 2020-09-24 DIAGNOSIS — Z7984 Long term (current) use of oral hypoglycemic drugs: Secondary | ICD-10-CM | POA: Diagnosis not present

## 2020-09-24 DIAGNOSIS — Z7901 Long term (current) use of anticoagulants: Secondary | ICD-10-CM | POA: Insufficient documentation

## 2020-09-24 DIAGNOSIS — I2510 Atherosclerotic heart disease of native coronary artery without angina pectoris: Secondary | ICD-10-CM | POA: Diagnosis not present

## 2020-09-24 DIAGNOSIS — Z950 Presence of cardiac pacemaker: Secondary | ICD-10-CM | POA: Insufficient documentation

## 2020-09-24 DIAGNOSIS — Z79899 Other long term (current) drug therapy: Secondary | ICD-10-CM | POA: Insufficient documentation

## 2020-09-24 DIAGNOSIS — R55 Syncope and collapse: Secondary | ICD-10-CM | POA: Diagnosis not present

## 2020-09-24 DIAGNOSIS — I10 Essential (primary) hypertension: Secondary | ICD-10-CM | POA: Insufficient documentation

## 2020-09-24 DIAGNOSIS — Z87891 Personal history of nicotine dependence: Secondary | ICD-10-CM | POA: Insufficient documentation

## 2020-09-24 DIAGNOSIS — R4182 Altered mental status, unspecified: Secondary | ICD-10-CM | POA: Diagnosis present

## 2020-09-24 DIAGNOSIS — R531 Weakness: Secondary | ICD-10-CM | POA: Diagnosis not present

## 2020-09-24 DIAGNOSIS — E119 Type 2 diabetes mellitus without complications: Secondary | ICD-10-CM | POA: Diagnosis not present

## 2020-09-24 LAB — CBC WITH DIFFERENTIAL/PLATELET
Abs Immature Granulocytes: 0.02 10*3/uL (ref 0.00–0.07)
Basophils Absolute: 0.1 10*3/uL (ref 0.0–0.1)
Basophils Relative: 1 %
Eosinophils Absolute: 0.2 10*3/uL (ref 0.0–0.5)
Eosinophils Relative: 3 %
HCT: 41.4 % (ref 39.0–52.0)
Hemoglobin: 14.1 g/dL (ref 13.0–17.0)
Immature Granulocytes: 0 %
Lymphocytes Relative: 38 %
Lymphs Abs: 2.6 10*3/uL (ref 0.7–4.0)
MCH: 30 pg (ref 26.0–34.0)
MCHC: 34.1 g/dL (ref 30.0–36.0)
MCV: 88.1 fL (ref 80.0–100.0)
Monocytes Absolute: 0.6 10*3/uL (ref 0.1–1.0)
Monocytes Relative: 9 %
Neutro Abs: 3.2 10*3/uL (ref 1.7–7.7)
Neutrophils Relative %: 49 %
Platelets: 171 10*3/uL (ref 150–400)
RBC: 4.7 MIL/uL (ref 4.22–5.81)
RDW: 12.5 % (ref 11.5–15.5)
WBC: 6.8 10*3/uL (ref 4.0–10.5)
nRBC: 0 % (ref 0.0–0.2)

## 2020-09-24 LAB — BASIC METABOLIC PANEL
Anion gap: 5 (ref 5–15)
BUN: 24 mg/dL — ABNORMAL HIGH (ref 8–23)
CO2: 27 mmol/L (ref 22–32)
Calcium: 10 mg/dL (ref 8.9–10.3)
Chloride: 106 mmol/L (ref 98–111)
Creatinine, Ser: 0.87 mg/dL (ref 0.61–1.24)
GFR, Estimated: >60 mL/min (ref >60)
Glucose, Bld: 174 mg/dL — ABNORMAL HIGH (ref 70–99)
Potassium: 4.2 mmol/L (ref 3.5–5.1)
Sodium: 138 mmol/L (ref 135–145)

## 2020-09-24 NOTE — ED Provider Notes (Signed)
[facility] Emergency Department Provider Note  ____________________________________________   Event Date/Time   First MD Initiated Contact with Patient 09/24/20 0112     (approximate)  I have reviewed the triage vital signs and the nursing notes.   HISTORY  Chief Complaint Altered Mental Status  Level 5 caveat:  history/ROS limited by chronic dementia  HPI Ernest [name] is a 85 y.o. male with medical history as listed below which notably includes a history of Alzheimer's dementia but who lives at home with his wife.  He has paroxysmal A. fib and is on Eliquis and has had a stroke in the past and has a pacemaker.  He presents tonight for evaluation of possible altered mental status.  His wife reports that he was sitting in a chair and was difficult to wake up.  She felt like his skin looked pale and she became concerned and called 911.  However since then she said that he has "pinked up" and skin looks more normal and he is back to his baseline mental status.  He said he feels fine.  He has no pain, no shortness of breath, no numbness nor weakness.  He is having no difficulty speaking.  He denies nausea and vomiting.  The episode was relatively acute in onset and severe enough to be concerning to his wife but nothing in particular seems to have made it better or worse and he has back to baseline.  He and his wife both deny any recent urinary changes, pain when urinating, or concerning appearance or odor to his urine.         Past Medical History:  Diagnosis Date  . Aortic atherosclerosis ([place])   . Arthritis   . BPH (benign prostatic hypertrophy)   . Coronary artery disease    a. Cath 12/2013->[facility]->Med rx.  . Deviated nasal septum   . Diabetes mellitus without complication ([place])    type 2  . Essential hypertension   . GERD (gastroesophageal reflux disease)   . Hearing deficit    wears hearing aids  . Hyperlipidemia   . 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 PAF (paroxysmal atrial fibrillation) ([place])    a. CHA2DS2VASc = 7-->[facility];  b. 03/2015 Echo: EF 55-60%, Gr1 DD, mild MR, mildly dil LA, PASP 10mmHg.  Marland Kitchen Pneumonia    as a child  . Presence of permanent cardiac pacemaker 08/16/2015  . PVD (peripheral vascular disease) ([place])   . Rosacea   . Stroke ([place])   . Thyroid disease   . Urine frequency     Patient Active Problem List   Diagnosis Date Noted  . Dementia with behavioral disturbance ([place]) 08/09/2019  . Sinus node dysfunction ([place]) 08/16/2015  . Bradycardia 08/16/2015  . PAF (paroxysmal atrial fibrillation) ([place])   . Coronary artery disease   . Essential hypertension   . Hypertension 03/11/2015  . Type 2 diabetes mellitus ([place]) 03/11/2015  . GERD (gastroesophageal reflux disease) 03/11/2015  . Hyperlipidemia 03/11/2015  . BPH (benign prostatic hyperplasia) 03/11/2015  . Atrial fibrillation with rapid ventricular response ([place]) 03/11/2015  . Atrial fibrillation with RVR ([place]) 03/10/2015  . Lumbar stenosis with neurogenic claudication 01/04/2015    Past Surgical History:  Procedure Laterality Date  . CARDIAC CATHETERIZATION     [facility] 12/24/13: 30% LM , pLAD, ostial CX. 20% mLAD, mRCA. 70% OM2 (small vessel). Med RX.   Marland Kitchen CARDIAC CATHETERIZATION     [facility]  . CARD[facility]AC CATHETERIZATION  [facility]  . CATARACT EXTRACTION W/ INTRAOCULAR LENS IMPLANT     right  . COLONOSCOPY    . EP IMPLANTABLE DEVICE N/A 08/16/2015   Procedure: Pacemaker Implant;  Surgeon: Deboraha Sprang, MD;  Location: [facility];  Service: Cardiovascular;  Laterality: N/A;  . LUMBAR LAMINECTOMY/DECOMPRESSION MICRODISCECTOMY N/A 01/04/2015   Procedure: LUMBAR LAMINECTOMY/DECOMPRESSION MICRODISCECTOMY 3 LEVELS;  Surgeon: Newman Pies, MD;  Location: [facility];  Service: Neurosurgery;  Laterality: N/A;  L23 L34 L45 laminectomies and foraminotomies  . MULTIPLE TOOTH EXTRACTIONS    . PROSTATE BIOPSY      Prior to Admission medications    Medication Sig Start Date End Date Taking? Authorizing Provider  apixaban (ELIQUIS) 5 MG TABS tablet Take 1 tablet (5 mg total) by mouth 2 (two) times daily. 06/15/20   Deboraha Sprang, MD  diltiazem (CARDIZEM CD) 120 MG 24 hr capsule Take 1 capsule (120 mg total) by mouth as needed (Take as needed for HR greater than 140 bpm). Take as needed for HR greater than 140 bpm 07/01/20 07/31/20  Deboraha Sprang, MD  divalproex (DEPAKOTE ER) 250 MG 24 hr tablet Take 250 mg by mouth daily.  12/29/19 12/28/20  [provider]  glipiZIDE (GLUCOTROL XL) 5 MG 24 hr tablet Take 5 mg by mouth daily with breakfast.  01/13/19 06/15/20  [provider]  levothyroxine (SYNTHROID) 125 MCG tablet Take 125 mcg by mouth daily before breakfast.     [provider]  LORazepam (ATIVAN) 0.5 MG tablet Take 0.5 mg by mouth daily.    [provider]  lovastatin (MEVACOR) 40 MG tablet Take 40 mg by mouth at bedtime.     [provider]  memantine (NAMENDA) 5 MG tablet Take 5 mg by mouth 2 (two) times daily.  02/19/18   [provider]  metFORMIN (GLUCOPHAGE) 500 MG tablet Take 1,000 mg by mouth at bedtime.  03/19/20   [provider]  metronidazole (NORITATE) 1 % cream Apply 1 application topically daily.    [provider]  Multiple Vitamin (MULTIVITAMIN) tablet Take 1 tablet by mouth daily.    [provider]  QUEtiapine (SEROQUEL) 25 MG tablet Take 25 mg by mouth at bedtime. 06/01/20   [provider]  sertraline (ZOLOFT) 25 MG tablet Take 25 mg by mouth daily.  12/12/18   [provider]  tamsulosin (FLOMAX) 0.4 MG CAPS capsule Take 0.4 mg by mouth 2 (two) times daily.    [provider]    Allergies Patient has no known allergies.  Family History  Problem Relation Age of Onset  . Heart attack Mother   . Heart attack Father   . Cancer - Lung Sister   . Cancer - Lung Brother   . Cancer - Lung Brother   . Cancer Brother    . Diabetes Sister     Social History Social History   Tobacco Use  . Smoking status: Former Smoker    Types: Cigarettes  . Smokeless tobacco: Never Used  . Tobacco comment: " Quit smoking cigarettes in 1983 "  Vaping Use  . Vaping Use: Former  Substance Use Topics  . Alcohol use: No  . Drug use: No    Review of Systems Level 5 caveat:  history/ROS limited by chronic dementia   Constitutional: No fever/chills Eyes: No visual changes. ENT: No sore throat. Cardiovascular: Denies chest pain. Respiratory: Denies shortness of breath. Gastrointestinal: No abdominal pain.  No nausea, no vomiting.  No diarrhea.  No constipation. Genitourinary: Negative for dysuria. Musculoskeletal: Negative for neck pain.  Negative for back pain. Integumentary: Negative for rash. Neurological: Negative for headaches, focal weakness or numbness.   ____________________________________________   PHYSICAL EXAM:  VITAL SIGNS: ED Triage Vitals  Enc Vitals Group     BP 09/24/20 0051 124/71     Pulse Rate 09/24/20 0051 68     Resp 09/24/20 0051 18     Temp 09/24/20 0051 98.7 F (37.1 C)     Temp Source 09/24/20 0051 Oral     SpO2 09/24/20 0046 98 %     Weight 09/24/20 0052 72.8 kg (160 lb 9.6 oz)     Height --      Head Circumference --      Peak Flow --      Pain Score 09/24/20 0052 0     Pain Loc --      Pain Edu? --      Excl. in [place]? --     Constitutional: Alert and oriented to self, knows his wife, no acute distress. Eyes: Conjunctivae are normal.  Head: Atraumatic. Nose: No congestion/rhinnorhea. Mouth/Throat: Patient is wearing a mask. Neck: No stridor.  No meningeal signs.   Cardiovascular: Normal rate, regular rhythm. Good peripheral circulation. Respiratory: Normal respiratory effort.  No retractions. Gastrointestinal: Soft and nontender. No distention.  Musculoskeletal: No lower extremity tenderness nor edema. No gross deformities of extremities. Neurologic:  Normal  speech and language. No gross focal neurologic deficits are appreciated.  Skin:  Skin is warm, dry and intact. Psychiatric: Mood and affect are normal. Speech and behavior are normal.  ____________________________________________   LABS (all labs ordered are listed, but only abnormal results are displayed)  Labs Reviewed  BASIC METABOLIC PANEL - Abnormal; Notable for the following components:      Result Value   Glucose, Bld 174 (*)    BUN 24 (*)    All other components within normal limits  CBC WITH DIFFERENTIAL/PLATELET   ____________________________________________  EKG  No indication for emergent EKG  ____________________________________________  RADIOLOGY I, Hinda Kehr, personally viewed and evaluated these images (plain radiographs) as part of my medical decision making, as well as reviewing the written report by the radiologist.  ED MD interpretation: No indication for emergent imaging  Official radiology report(s): No results found.  ____________________________________________   PROCEDURES   Procedure(s) performed (including Critical Care):  Procedures   ____________________________________________   INITIAL IMPRESSION / MDM / ASSESSMENT AND PLAN / ED COURSE  As part of my medical decision making, I reviewed the following data within the [place] History obtained from family, Nursing notes reviewed and incorporated, Labs reviewed , Old chart reviewed and Notes from prior ED visits   Differential diagnosis includes, but is not limited to, normal exam, electrolyte or metabolic abnormality, CVA, medication or drug side effect, dementia, delirium.  Patient is well-appearing and in no distress and at his baseline.  His wife said that she was concerned earlier when he seemed more sleepy than usual but agree that he is back to his baseline and that he is not as pale as before.  The patient never endorsed any pain or discomfort.  I talked  with the wife who initially suggested that they could just go home but we agreed on a plan for observation for period of time and some basic lab work.  He has had no urinary symptoms recently and I do not think it would be beneficial  to check a urinalysis, but we will check his electrolytes and CBC to make sure he is not anemic and continue to observe him for any additional symptoms.  Overall the evaluation thus far is reassuring.     Clinical Course as of 09/24/20 0411  Fri Sep 24, 2020  0409 Lab work was within normal limits.  Patient is awake and alert, conversing with his wife.  Neither of them have any concerns right now and his wife said that they are ready to go.  I encouraged outpatient follow-up and gave my usual and customary return precautions. [CF]    Clinical Course User Index [CF] Hinda Kehr, MD     ____________________________________________  FINAL CLINICAL IMPRESSION(S) / ED DIAGNOSES  Final diagnoses:  Transient alteration of awareness     MEDICATIONS GIVEN DURING THIS VISIT:  Medications - No data to display   ED Discharge Orders    None      *Please note:  JAYLAN HINOJOSA was evaluated in Emergency Department on 09/24/2020 for the symptoms described in the history of present illness. He was evaluated in the context of the global COVID-19 pandemic, which necessitated consideration that the patient might be at risk for infection with the SARS-CoV-2 virus that causes COVID-19. Institutional protocols and algorithms that pertain to the evaluation of patients at risk for COVID-19 are in a state of rapid change based on information released by regulatory bodies including the CDC and federal and state organizations. These policies and algorithms were followed during the patient's care in the ED.  Some ED evaluations and interventions may be delayed as a result of limited staffing during and after the pandemic.*  Note:  This document was prepared using Dragon voice  recognition software and may include unintentional dictation errors.   Hinda Kehr, MD 09/24/20 [phone]

## 2020-09-24 NOTE — Discharge Instructions (Addendum)
Your workup in the Emergency Department today was reassuring.  We did not find any specific abnormalities.  We recommend you drink plenty of fluids, take your regular medications and/or any new ones prescribed today, and follow up with the doctor(s) listed in these documents as recommended.  Return to the Emergency Department if you develop new or worsening symptoms that concern you.  

## 2020-09-24 NOTE — ED Triage Notes (Signed)
EMS brought in from home for chief complaint of lethargy/AMS. Wife concerned patient was difficult to awaken around 11pm tonight. Reports attempting physical stimuli and "he just sat there and wouldn't wake up or talk to me".  Upon arrival patient alert but confused.

## 2020-10-14 ENCOUNTER — Telehealth: Payer: Self-pay

## 2020-10-14 NOTE — Telephone Encounter (Signed)
Received message to call patient's wife, Cornelia. Spoke with Cornelia who shared that patient is only taking in sips of fluid, even of his favorite drink. Cornelia fears that patient will become dehydrated. Encouraged to give patient frequent sips as possible. Will update Palliative NP

## 2020-11-16 DIAGNOSIS — E1165 Type 2 diabetes mellitus with hyperglycemia: Secondary | ICD-10-CM | POA: Diagnosis not present

## 2020-11-16 DIAGNOSIS — Z79899 Other long term (current) drug therapy: Secondary | ICD-10-CM | POA: Diagnosis not present

## 2020-11-23 DIAGNOSIS — Z0000 Encounter for general adult medical examination without abnormal findings: Secondary | ICD-10-CM | POA: Diagnosis not present

## 2020-11-23 DIAGNOSIS — Z1331 Encounter for screening for depression: Secondary | ICD-10-CM | POA: Diagnosis not present

## 2020-11-29 ENCOUNTER — Ambulatory Visit (INDEPENDENT_AMBULATORY_CARE_PROVIDER_SITE_OTHER): Payer: PPO

## 2020-11-29 DIAGNOSIS — I495 Sick sinus syndrome: Secondary | ICD-10-CM

## 2020-12-01 ENCOUNTER — Telehealth: Payer: Self-pay | Admitting: Nurse Practitioner

## 2020-12-01 LAB — CUP PACEART REMOTE DEVICE CHECK
Battery Remaining Longevity: 62 mo
Battery Voltage: 3 V
Brady Statistic AP VP Percent: 0.02 %
Brady Statistic AP VS Percent: 33.36 %
Brady Statistic AS VP Percent: 0.03 %
Brady Statistic AS VS Percent: 66.59 %
Brady Statistic RA Percent Paced: 33.15 %
Brady Statistic RV Percent Paced: 0.05 %
Date Time Interrogation Session: 20220523104848
Implantable Lead Implant Date: 20170206
Implantable Lead Implant Date: 20170206
Implantable Lead Location: 753859
Implantable Lead Location: 753860
Implantable Lead Model: 5076
Implantable Lead Model: 5076
Implantable Pulse Generator Implant Date: 20170206
Lead Channel Impedance Value: 361 Ohm
Lead Channel Impedance Value: 399 Ohm
Lead Channel Impedance Value: 418 Ohm
Lead Channel Impedance Value: 456 Ohm
Lead Channel Pacing Threshold Amplitude: 0.625 V
Lead Channel Pacing Threshold Amplitude: 0.875 V
Lead Channel Pacing Threshold Pulse Width: 0.4 ms
Lead Channel Pacing Threshold Pulse Width: 0.4 ms
Lead Channel Sensing Intrinsic Amplitude: 1.375 mV
Lead Channel Sensing Intrinsic Amplitude: 1.375 mV
Lead Channel Sensing Intrinsic Amplitude: 7.125 mV
Lead Channel Sensing Intrinsic Amplitude: 7.125 mV
Lead Channel Setting Pacing Amplitude: 2 V
Lead Channel Setting Pacing Amplitude: 2.5 V
Lead Channel Setting Pacing Pulse Width: 0.4 ms
Lead Channel Setting Sensing Sensitivity: 2.8 mV

## 2020-12-01 NOTE — Telephone Encounter (Signed)
Spoke with patient's wife, Cornelia, to see if we could change the time of the 12/08/20 Palliative f/u visit from 2 to 2:30 PM and she was in agreement with this.

## 2020-12-08 ENCOUNTER — Encounter: Payer: Self-pay | Admitting: Nurse Practitioner

## 2020-12-08 ENCOUNTER — Other Ambulatory Visit: Payer: Self-pay

## 2020-12-08 ENCOUNTER — Other Ambulatory Visit: Payer: PPO | Admitting: Nurse Practitioner

## 2020-12-08 DIAGNOSIS — F0391 Unspecified dementia with behavioral disturbance: Secondary | ICD-10-CM

## 2020-12-08 DIAGNOSIS — F03911 Unspecified dementia, unspecified severity, with agitation: Secondary | ICD-10-CM

## 2020-12-08 DIAGNOSIS — Z515 Encounter for palliative care: Secondary | ICD-10-CM

## 2020-12-08 NOTE — Progress Notes (Signed)
[facility] Palliative Care Consult Note Telephone: [phone]  Fax: [phone]    Date of encounter: 12/08/20 PATIENT NAME: Ernest [name] [address] [place] [address]   [phone] (home)  DOB: 1935-12-10 MRN: [id number] PRIMARY CARE PROVIDER:    Derinda Late, MD,  [place]. [facility] and Internal Medicine [place] [id number] [phone]  RESPONSIBLE PARTY:    Contact Information    Name Relation Home Work Mobile   Inocente, Krach [phone]  [phone]     I met face to face with patient and wife in home. Palliative Care was asked to follow this patient by consultation request of  Derinda Late, MD to address advance care planning and complex medical decision making. This is a follow up visit.  ASSESSMENT AND PLAN / RECOMMENDATIONS:  Symptom Management/Plan: 1.Advance Care Planning;DNR and MOST form completed placed in vynca with wishes DNR, Limited additional interventions, wishes IVF, antibiotics; no feeding tube  2. Goals of Care: Goals include to maximize quality of life and symptom management. Our advance care planning conversation included a discussion about:   The value and importance of advance care planning  Exploration of personal, cultural or spiritual beliefs that might influence medical decisions  Exploration of goals of care in the event of a sudden injury or illness  Identification and preparation of a healthcare agent  Review and updating or creation of anadvance directive document.  3.Palliative care encounter; Palliative care encounter; Palliative medicine team will continue to support patient, patient's family, and medical team. Visit consisted of counseling and education dealing with the complex and emotionally intense issues of symptom management and palliative care in the setting of serious and potentially life-threatening illness  4.  Agitation; secondary to dementia; discussed at length to continue seroquel at bedtime. We talked about option of serquel qam also. Mrs. Oestreich endorses she continues to use melatonin 4m (usually giving a total of 366mper day) for increase irritability, agitation. Mrs. TaMartinsonndorses it helps and she would like to continue use melatonin and wait to add an am dose seroquel. We talked about her safety with agitation, Mrs. TaIversenndorses she understands. We talked about option of RN/SW monthly visits with SW for more discussions of overall progressive decline, concern for increase in skill level, possible placement should Mr. Modesto behaviors worsen if not able to be managed at home. Mrs. Ariola declines RN/SW assistance at this time; Mrs. TaMccamishndorses with Palliative NP visit it is all she needs for now   Left arm 21 cm Left leg 34 cm  Follow up Palliative Care Visit: Palliative care will continue to follow for complex medical decision making, advance care planning, and clarification of goals. Return 8 weeks or prn.  I spent 60 minutes providing this consultation. More than 50% of the time in this consultation was spent in counseling and care coordination.  PPS: 40%  HOSPICE ELIGIBILITY/DIAGNOSIS: TBD  Chief Complaint: Follow up palliative consult for complex medical decision making  HISTORY OF PRESENT ILLNESS:  [name] a [age].o. year old male  with  Alzheimer's Dementia, afib, DM, HLD, hypothyroidism, CAD, aortic atherosclerosis, PVD, HTN, lumbar stenosis, pulmonary nodule, BPH. I call [name] confirm palliative care visit with COVID screening which was negative. I visited Mr and Mrs TaTugwellMr TaGehretas sitting on the porch with [name] they went into their home when I arrived. [name] in his recliner  with his feet up and [name] on the couch. We talked about how [name] has been doing. [name] endorses continue with slow ongoing decline in the setting of  dementia. We talked about chronic disease progression. We talked about recent office visit at primary. Mrs Capano endorses she was able to take [name] to get a haircut. [name] endorses that when she got to the haircut place [name] got out of the front seat of the car and got in the back and refused to get out. [name] endorses she shared with Mr Gersten that his nephew was in the barbershop which is how she was able to get [name] into the barbershop. Mrs Clemetine Marker endorses Mrs. Maxim was cooperative with his haircut. We talked about [name]'s appetite which is varied. Recent lab showed total protein and albumin stable. We talked about no new weight loss as no changes in clothes size. We talked about no new changes in texture of foods or appears to be choking or coughing during eating or drinking. We talked about functional decline as it's getting more difficult for [name] to ambulate. [name] continues to lie on the floor or sit down wherever he pleases. [name] does require to be bathed and dressed as he is not able to do that for himself. Mr Beadnell is incontinent bowel and bladder. At times [name] is able to toilet ham other times he uses the restroom wherever he pleases. [name] and I talked about the challenges that she faces and barriers with every day care. We talked about [name] behaviors. [name] endorses she continues to use the melatonin during the day forsevere behaviors which seems to be very effective for [name] as it calms him down it kind of resets him. We talked about the Seroquel that he takes at bedtime as well. We talked about the option of Seroquel for a morning dose but [name] endorses she is happy with the way things are using the melatonin rather than adding Seroquel in the morning for now. We talked about disease progression with realistic expectations. We talked about caregivers stress and fatigue. [name] talked about the organization she  continues to be involved with though on a limited basis with increase in caregiving needs at home. We talked about the importance of caregiver stress and burnout, coping strategies. We talked about Mr Slivinski sleep pattern which varies. Mrs Shell endorses she always to be aware of what he is doing as he will wander if able. Mrs Maish endorses no recent wanderings. We talked about medical goals of care period we talked about option of RN/SW palliative for monthly visits which at this point Mrs Welker declined. Mrs Starlin endorses she prefers just to continue palliative nurse practitioner visits as she feels like she is doing fine at this time. We talked about Hospice benefit briefly with Medicare benefit though at this point without weight loss. Mr. Rho remains ambulatory with unsteady gait will continue to follow a monitor, fall risk. Also Mrs Ratterree endorses she is not ready for Hospice services to start helping either. We talked about role of palliative care in plan of care period we talked about quality of life. We talked about Mr Bufford continuing to rearrange furniture, bother the cat, play with his toys and scatters them around. Mrs Januszewski endorses he had a blanket the other night that he tide in many knots that kept him busy for some time. We  talked about overall functional and cognitive decline. We talked about further planning should something happen to Mrs Tuminello what would happen to Mr Balles. Mrs Pulliam talked at length about her brother helping, Mr Ham would have to be placed. We talked about should something occur palliative can continue help with Mr Oestreich and placement if needed. We talked about follow up palliative care visit in eight weeks if needed or sooner should he decline. Mrs Deloatch in agreement and appointment scheduled. Therapeutic listening, emotional support provided. Contact information. Questions answered to satisfaction.  History obtained from review of EMR, discussion with  Mrs and  Mr. Kwan.  I reviewed available labs, medications, imaging, studies and related documents from the EMR.  Records reviewed and summarized above.   ROS Full 14 system review of systems performed and negative with exception of: as per HPI.  Physical Exam: Constitutional: NAD General: frail appearing, thin, confused male EYES:  lids intact ENMT: oral mucous membranes moist CV: S1S2, RRR, no LE edema Pulmonary: LCTA, no increased work of breathing, no cough, room air Abdomen: intake 75-100%, normo-active BS + 4 quadrants, soft and non tender GU: deferred MSK: ambulatory with unsteady gait Skin: warm and dry, no rashes or wounds on visible skin Neuro:  + generalized weakness,  + cognitive impairment Psych: confused, irritable at times  Questions and concerns were addressed. The patient/family was encouraged to call with questions and/or concerns. My contact information was provided. Provided general support and encouragement, no other unmet needs identified  Thank you for the opportunity to participate in the care of Mr. Westerman.  The palliative care team will continue to follow. Please call our office at [phone] if we can be of additional assistance.   This chart was dictated using voice recognition software. Despite best efforts to proofread, errors can occur which can change the documentation meaning.   Kevan Prouty Z Rylin Seavey, NP , MSN,  ACHPN  COVID-19 PATIENT SCREENING TOOL Asked and negative response unless otherwise noted:   Have you had symptoms of covid, tested positive or been in contact with someone with symptoms/positive test in the past 5-10 days? NO

## 2020-12-13 ENCOUNTER — Telehealth: Payer: Self-pay

## 2020-12-13 MED ORDER — ELIQUIS 5 MG PO TABS: 5.0000 mg | ORAL_TABLET | Freq: Two times a day (BID) | ORAL | 1 refills | Status: AC

## 2020-12-13 NOTE — Telephone Encounter (Signed)
Pt's age 85, wt 72.8 kg, SCr 0.7, CrCl 83.78, last ov w/ SK 06/15/20.

## 2020-12-13 NOTE — Addendum Note (Signed)
Addended by: Dede Query R on: 12/13/2020 10:35 AM   Modules accepted: Orders

## 2020-12-13 NOTE — Telephone Encounter (Signed)
*  STAT* If patient is at the pharmacy, call can be transferred to refill team.   1. Which medications need to be refilled? (please list name of each medication and dose if known) Eliquis     2. Which pharmacy/location (including street and city if local pharmacy) is medication to be sent to? [facility]  3. Do they need a 30 day or 90 day supply? [facility]

## 2020-12-20 NOTE — Progress Notes (Signed)
Remote pacemaker transmission.   

## 2020-12-23 ENCOUNTER — Encounter: Payer: Self-pay | Admitting: Internal Medicine

## 2020-12-23 ENCOUNTER — Telehealth (INDEPENDENT_AMBULATORY_CARE_PROVIDER_SITE_OTHER): Payer: PPO | Admitting: Internal Medicine

## 2020-12-23 VITALS — BP 116/64 | HR 62 | Ht 67.0 in | Wt 158.0 lb

## 2020-12-23 DIAGNOSIS — I495 Sick sinus syndrome: Secondary | ICD-10-CM

## 2020-12-23 DIAGNOSIS — Z950 Presence of cardiac pacemaker: Secondary | ICD-10-CM | POA: Diagnosis not present

## 2020-12-23 DIAGNOSIS — R001 Bradycardia, unspecified: Secondary | ICD-10-CM

## 2020-12-23 DIAGNOSIS — I480 Paroxysmal atrial fibrillation: Secondary | ICD-10-CM

## 2020-12-23 DIAGNOSIS — I10 Essential (primary) hypertension: Secondary | ICD-10-CM

## 2020-12-23 NOTE — Progress Notes (Signed)
Electrophysiology TeleHealth Note   Due to national recommendations of social distancing due to COVID 19, an audio/video telehealth visit is felt to be most appropriate for this patient at this time.  See MyChart message from today for the patient's consent to telehealth for [facility].   Date:  12/23/2020   ID:  Ernest [name], DOB 1935-11-01, MRN [id number]  Location: patient's home  Provider location: [address] [place]  Evaluation Performed: Follow-up visit  PCP:  Derinda Late, MD  Cardiologist:   BAE Electrophysiologist:  SK   Chief Complaint:  Afib and sinus node dysfunction   History of Present Illness:    Ernest [name] is a 85 y.o. male who presents via audio/video conferencing for a telehealth visit today.  Since last being seen in our clinic atrial fibrillation-paroxysmal with rapid ventricular response associated with sinus bradycardia  status post pacemaker 2/17 [employer]tr[employer] anticoagulation with apixaban and significant deterioration in status secondary to dementia, the patient wife reports dementia continues to worsen, weight stable , started on seroquel  tolerating   Melatonin helpfully   signed up for palliative care   [place] patient denies chest pain, shortness of breath, nocturnal dyspnea, orthopnea or peripheral edema.  There have been no palpitations, lightheadedness or syncope.  Complains of shoulder pain.     DATE TEST EF    2015 Cath   70% Marginal  9/16 echo  55-60 mild LAE   1/21 Echo   NOT READ           Date TSH Cr K Hgb  9/17  3.98        12/17  4.12        12/18 5.016 0.8 4.3 14.3  7/19 4.462 0.8 4.5 14.5  6/20 1.96 0.9 4.2 14.7  1/21 0.355 0.67 4.1 13.5  9/21 2.78 (5/21) (CE)    0.7 4.2 14.9  3/22 0.9 (1/22) 0.87 4.2 14.1         Past Medical History:  Diagnosis Date   Aortic atherosclerosis (HCC)    Arthritis    BPH (benign prostatic hypertrophy)    Coronary artery disease    a. Cath  12/2013->[facility]->Med rx.   Deviated nasal septum    Diabetes mellitus without complication (HCC)    type 2   Essential hypertension    GERD (gastroesophageal reflux disease)    Hearing deficit    wears hearing aids   Hyperlipidemia    Lupus (HCC)    discoid of scalp only   PAF (paroxysmal atrial fibrillation) ([place])    a. CHA2DS2VASc = 7-->eliquis;  b. 03/2015 Echo: EF 55-60%, Gr1 DD, mild MR, mildly dil LA, PASP 50mmHg.   Pneumonia    as a child   Presence of permanent cardiac pacemaker 08/16/2015   PVD (peripheral vascular disease) ([place])    Rosacea    Stroke ([place])    Thyroid disease    Urine frequency     Past Surgical History:  Procedure Laterality Date   CARDIAC CATHETERIZATION     [facility] 12/24/13: 30% LM , pLAD, ostial CX. 20% mLAD, mRCA. 70% OM2 (small vessel). Med RX.    CARDIAC CATHETERIZATION     [facility]   [facility]   CATARACT EXTRACTION W/ INTRAOCULAR LENS IMPLANT     right   COLONOSCOPY     EP IMPLANTABLE DEVICE N/A 08/16/2015   Procedure: Pacemaker Implant;  Surgeon: Deboraha Sprang, MD;  Location: [facility]  INVASIVE CV LAB;  Service: Cardiovascular;  Laterality: N/A;   LUMBAR LAMINECTOMY/DECOMPRESSION MICRODISCECTOMY N/A 01/04/2015   Procedure: LUMBAR LAMINECTOMY/DECOMPRESSION MICRODISCECTOMY 3 LEVELS;  Surgeon: Newman Pies, MD;  Location: [place] NEURO ORS;  Service: Neurosurgery;  Laterality: N/A;  L23 L34 L45 laminectomies and foraminotomies   MULTIPLE TOOTH EXTRACTIONS     PROSTATE BIOPSY      Current Outpatient Medications  Medication Sig Dispense Refill   apixaban (ELIQUIS) 5 MG TABS tablet Take 1 tablet (5 mg total) by mouth 2 (two) times daily. 180 tablet 1   diltiazem (CARDIZEM CD) 120 MG 24 hr capsule Take 1 capsule (120 mg total) by mouth as needed (Take as needed for HR greater than 140 bpm). Take as needed for HR greater than 140 bpm 30 capsule 3   divalproex (DEPAKOTE ER) 250 MG 24 hr tablet Take 250 mg by mouth daily.      glipiZIDE  (GLUCOTROL XL) 5 MG 24 hr tablet Take 10 mg by mouth daily with breakfast.     levothyroxine (SYNTHROID) 125 MCG tablet Take 125 mcg by mouth daily before breakfast.      lovastatin (MEVACOR) 40 MG tablet Take 40 mg by mouth at bedtime.      memantine (NAMENDA) 5 MG tablet Take 5 mg by mouth 2 (two) times daily.      metronidazole (NORITATE) 1 % cream Apply 1 application topically daily.     Multiple Vitamin (MULTIVITAMIN) tablet Take 1 tablet by mouth daily.     QUEtiapine (SEROQUEL) 50 MG tablet TAKE 1 TABLET(50 MG) BY MOUTH EVERY NIGHT     sertraline (ZOLOFT) 25 MG tablet Take 25 mg by mouth daily.      tamsulosin (FLOMAX) 0.4 MG CAPS capsule Take 0.4 mg by mouth 2 (two) times daily.     No current facility-administered medications for this visit.    Allergies:   Patient has no known allergies.   Social History:  The patient  reports that he has quit smoking. His smoking use included cigarettes. He has never used smokeless tobacco. He reports that he does not drink alcohol and does not use drugs.   Family History:  The patient's   family history includes Cancer in his brother; Cancer - Lung in his brother, brother, and sister; Diabetes in his sister; Heart attack in his father and mother.   ROS:  Please see the history of present illness.   All other systems are personally reviewed and negative.    Exam:    Vital Signs:  BP 116/64   Pulse 62   Ht 5\' 7"  (1.702 m)   Wt 158 lb (71.7 kg)   SpO2 99%   BMI 24.75 kg/m      Labs/Other Tests and Data Reviewed:    Recent Labs: 03/20/2020: ALT 20; B Natriuretic Peptide 64.8; Magnesium 1.9 09/24/2020: BUN 24; Creatinine, Ser 0.87; Hemoglobin 14.1; Platelets 171; Potassium 4.2; Sodium 138   Wt Readings from Last 3 Encounters:  12/23/20 158 lb (71.7 kg)  09/24/20 160 lb 9.6 oz (72.8 kg)  06/15/20 166 lb (75.3 kg)     Other studies personally reviewed: Additional studies/ records that were reviewed today include: (As above)     Last  device remote is reviewed from [place] PDF dated 5/22 which reveals normal device function,   arrhythmias -intermittent atrial fibrillation with reasonably controlled rates and spontaneously terminating   ASSESSMENT & PLAN:   Atrial fibrillation-paroxysmal   Coronary artery disease- nonobstructive   Pacemaker-Medtronic  Prior stroke   Sinus bradycardia   Gait instability    Dementia   Hypotension   Infrequent interval atrial fibrillation    No bleeding on Apixoban  continue at 5 mg bid  CAD without angina, continue dilt 120 and mevacor 40 mg; last LDL at target  1/22 at 56mg /dl   Hypotension better so will continue dilt at curent dosing   Has discussed with PCP re palliative care     COVID 19 screen The patient denies symptoms of COVID 19 at this time.  The importance of social distancing was discussed today.  Follow-up:  12 m    Current medicines are reviewed at length with the patient today.   The patient does not have concerns regarding his medicines.  The following changes were made today:  none  Labs/ tests ordered today include:   No orders of the defined types were placed in this encounter.     Patient Risk:  after full review of this patients clinical status, I feel that they are at moderate  risk at this time.  Today, I have spent 9 minutes with the patient with telehealth technology discussing the above.   Avie Echevaria  12/23/2020 10:06 AM     [facility] [address] [place] [address] [phone] (office) [phone] (fax) Was is let you them are I did not assume that-HIV sometimes your) question before if if see replyPatient ID: Ernest [name], male   DOB: 1935-11-06, 85 y.o.   MRN: [id number]

## 2020-12-23 NOTE — Patient Instructions (Signed)
Medication Instructions:  - Your physician recommends that you continue on your current medications as directed. Please refer to the Current Medication list given to you today.  *If you need a refill on your cardiac medications before your next appointment, please call your pharmacy*   Lab Work: - none ordered  If you have labs (blood work) drawn today and your tests are completely normal, you will receive your results only by: [place] (if you have MyChart) OR A paper copy in the mail If you have any lab test that is abnormal or we need to change your treatment, we will call you to review the results.   Testing/Procedures: - none ordered   Follow-Up: At [facility], you and your health needs are our priority.  As part of our continuing mission to provide you with exceptional heart care, we have created designated Provider Care Teams.  These Care Teams include your primary Cardiologist (physician) and Advanced Practice Providers (APPs -  Physician Assistants and Nurse Practitioners) who all work together to provide you with the care you need, when you need it.  We recommend signing up for the patient portal called "MyChart".  Sign up information is provided on this After Visit Summary.  MyChart is used to connect with patients for Virtual Visits (Telemedicine).  Patients are able to view lab/test results, encounter notes, upcoming appointments, etc.  Non-urgent messages can be sent to your provider as well.   To learn more about what you can do with MyChart, go to [email or web address].    Your next appointment:   1 year(s)  The format for your next appointment:   Virtual Visit   Provider:   Virl Axe, MD   Other Instructions N/a

## 2021-01-14 ENCOUNTER — Emergency Department: Payer: PPO

## 2021-01-14 ENCOUNTER — Other Ambulatory Visit: Payer: Self-pay

## 2021-01-14 ENCOUNTER — Observation Stay
Admission: EM | Admit: 2021-01-14 | Discharge: 2021-01-15 | Disposition: A | Discharge status: Home / Self Care | Payer: PPO | Attending: Internal Medicine | Admitting: Internal Medicine

## 2021-01-14 ENCOUNTER — Encounter: Payer: Self-pay | Admitting: Emergency Medicine

## 2021-01-14 DIAGNOSIS — Z79899 Other long term (current) drug therapy: Secondary | ICD-10-CM | POA: Insufficient documentation

## 2021-01-14 DIAGNOSIS — I10 Essential (primary) hypertension: Secondary | ICD-10-CM | POA: Insufficient documentation

## 2021-01-14 DIAGNOSIS — R404 Transient alteration of awareness: Secondary | ICD-10-CM | POA: Diagnosis not present

## 2021-01-14 DIAGNOSIS — I2510 Atherosclerotic heart disease of native coronary artery without angina pectoris: Secondary | ICD-10-CM | POA: Diagnosis not present

## 2021-01-14 DIAGNOSIS — I4891 Unspecified atrial fibrillation: Secondary | ICD-10-CM | POA: Diagnosis not present

## 2021-01-14 DIAGNOSIS — Z87891 Personal history of nicotine dependence: Secondary | ICD-10-CM | POA: Diagnosis not present

## 2021-01-14 DIAGNOSIS — N400 Enlarged prostate without lower urinary tract symptoms: Secondary | ICD-10-CM | POA: Diagnosis present

## 2021-01-14 DIAGNOSIS — I499 Cardiac arrhythmia, unspecified: Secondary | ICD-10-CM | POA: Diagnosis not present

## 2021-01-14 DIAGNOSIS — E119 Type 2 diabetes mellitus without complications: Secondary | ICD-10-CM

## 2021-01-14 DIAGNOSIS — Z7984 Long term (current) use of oral hypoglycemic drugs: Secondary | ICD-10-CM | POA: Diagnosis not present

## 2021-01-14 DIAGNOSIS — R079 Chest pain, unspecified: Secondary | ICD-10-CM | POA: Diagnosis not present

## 2021-01-14 DIAGNOSIS — F0390 Unspecified dementia without behavioral disturbance: Secondary | ICD-10-CM | POA: Diagnosis not present

## 2021-01-14 DIAGNOSIS — E039 Hypothyroidism, unspecified: Secondary | ICD-10-CM | POA: Insufficient documentation

## 2021-01-14 DIAGNOSIS — Z7901 Long term (current) use of anticoagulants: Secondary | ICD-10-CM | POA: Diagnosis not present

## 2021-01-14 DIAGNOSIS — F0391 Unspecified dementia with behavioral disturbance: Secondary | ICD-10-CM | POA: Diagnosis present

## 2021-01-14 DIAGNOSIS — I480 Paroxysmal atrial fibrillation: Principal | ICD-10-CM | POA: Insufficient documentation

## 2021-01-14 DIAGNOSIS — Z20822 Contact with and (suspected) exposure to covid-19: Secondary | ICD-10-CM | POA: Insufficient documentation

## 2021-01-14 DIAGNOSIS — R739 Hyperglycemia, unspecified: Secondary | ICD-10-CM | POA: Diagnosis not present

## 2021-01-14 DIAGNOSIS — F03918 Unspecified dementia, unspecified severity, with other behavioral disturbance: Secondary | ICD-10-CM | POA: Diagnosis present

## 2021-01-14 DIAGNOSIS — R0602 Shortness of breath: Secondary | ICD-10-CM | POA: Diagnosis not present

## 2021-01-14 LAB — CBC WITH DIFFERENTIAL/PLATELET
Abs Immature Granulocytes: 0.02 10*3/uL (ref 0.00–0.07)
Basophils Absolute: 0.1 10*3/uL (ref 0.0–0.1)
Basophils Relative: 1 %
Eosinophils Absolute: 0.1 10*3/uL (ref 0.0–0.5)
Eosinophils Relative: 1 %
HCT: 38 % — ABNORMAL LOW (ref 39.0–52.0)
Hemoglobin: 13.6 g/dL (ref 13.0–17.0)
Immature Granulocytes: 0 %
Lymphocytes Relative: 26 %
Lymphs Abs: 2 10*3/uL (ref 0.7–4.0)
MCH: 30.6 pg (ref 26.0–34.0)
MCHC: 35.8 g/dL (ref 30.0–36.0)
MCV: 85.4 fL (ref 80.0–100.0)
Monocytes Absolute: 0.5 10*3/uL (ref 0.1–1.0)
Monocytes Relative: 7 %
Neutro Abs: 4.9 10*3/uL (ref 1.7–7.7)
Neutrophils Relative %: 65 %
Platelets: 141 10*3/uL — ABNORMAL LOW (ref 150–400)
RBC: 4.45 MIL/uL (ref 4.22–5.81)
RDW: 12.5 % (ref 11.5–15.5)
WBC: 7.5 10*3/uL (ref 4.0–10.5)
nRBC: 0 % (ref 0.0–0.2)

## 2021-01-14 LAB — RESP PANEL BY RT-PCR (FLU A&B, COVID) ARPGX2
Influenza A by PCR: NEGATIVE
Influenza B by PCR: NEGATIVE
SARS Coronavirus 2 by RT PCR: NEGATIVE

## 2021-01-14 LAB — COMPREHENSIVE METABOLIC PANEL
ALT: 22 U/L (ref 0–44)
AST: 21 U/L (ref 15–41)
Albumin: 3.6 g/dL (ref 3.5–5.0)
Alkaline Phosphatase: 86 U/L (ref 38–126)
Anion gap: 5 (ref 5–15)
BUN: 25 mg/dL — ABNORMAL HIGH (ref 8–23)
CO2: 25 mmol/L (ref 22–32)
Calcium: 9.4 mg/dL (ref 8.9–10.3)
Chloride: 104 mmol/L (ref 98–111)
Creatinine, Ser: 0.84 mg/dL (ref 0.61–1.24)
GFR, Estimated: >60 mL/min (ref >60)
Glucose, Bld: 261 mg/dL — ABNORMAL HIGH (ref 70–99)
Potassium: 4.1 mmol/L (ref 3.5–5.1)
Sodium: 134 mmol/L — ABNORMAL LOW (ref 135–145)
Total Bilirubin: 0.6 mg/dL (ref 0.3–1.2)
Total Protein: 6.3 g/dL — ABNORMAL LOW (ref 6.5–8.1)

## 2021-01-14 LAB — BRAIN NATRIURETIC PEPTIDE: B Natriuretic Peptide: 87.6 pg/mL (ref 0.0–100.0)

## 2021-01-14 LAB — TROPONIN I (HIGH SENSITIVITY): Troponin I (High Sensitivity): 6 ng/L (ref <18)

## 2021-01-14 LAB — TSH: TSH: 0.913 u[IU]/mL (ref 0.350–4.500)

## 2021-01-14 LAB — MAGNESIUM: Magnesium: 1.9 mg/dL (ref 1.7–2.4)

## 2021-01-14 MED ORDER — ACETAMINOPHEN 325 MG PO TABS: 650.0000 mg | ORAL_TABLET | Freq: Four times a day (QID) | ORAL | Status: DC | PRN

## 2021-01-14 MED ORDER — ACETAMINOPHEN 650 MG RE SUPP: 650.0000 mg | Freq: Four times a day (QID) | RECTAL | Status: DC | PRN

## 2021-01-14 MED ORDER — ONDANSETRON HCL 4 MG PO TABS: 4.0000 mg | ORAL_TABLET | Freq: Four times a day (QID) | ORAL | Status: DC | PRN

## 2021-01-14 MED ORDER — DILTIAZEM HCL-DEXTROSE 125-5 MG/125ML-% IV SOLN (PREMIX)
5.0000 mg/h | INTRAVENOUS | Status: DC
  Administered 2021-01-14: 5 mg/h via INTRAVENOUS
  Filled 2021-01-14: qty 125

## 2021-01-14 MED ORDER — INSULIN ASPART 100 UNIT/ML IJ SOLN
0.0000 [IU] | Freq: Three times a day (TID) | INTRAMUSCULAR | Status: DC
  Administered 2021-01-15: 3 [IU] via SUBCUTANEOUS
  Administered 2021-01-15: 2 [IU] via SUBCUTANEOUS
  Filled 2021-01-14 (×2): qty 1

## 2021-01-14 MED ORDER — HALOPERIDOL LACTATE 5 MG/ML IJ SOLN
1.0000 mg | Freq: Once | INTRAMUSCULAR | Status: AC
  Administered 2021-01-14: 1 mg via INTRAVENOUS

## 2021-01-14 MED ORDER — HALOPERIDOL LACTATE 5 MG/ML IJ SOLN
2.0000 mg | Freq: Once | INTRAMUSCULAR | Status: DC
  Filled 2021-01-14: qty 1

## 2021-01-14 MED ORDER — DILTIAZEM LOAD VIA INFUSION
10.0000 mg | Freq: Once | INTRAVENOUS | Status: AC
  Administered 2021-01-14: 10 mg via INTRAVENOUS
  Filled 2021-01-14: qty 10

## 2021-01-14 MED ORDER — INSULIN ASPART 100 UNIT/ML IJ SOLN
0.0000 [IU] | Freq: Every day | INTRAMUSCULAR | Status: DC
  Administered 2021-01-15: 2 [IU] via SUBCUTANEOUS
  Filled 2021-01-14: qty 1

## 2021-01-14 MED ORDER — HYDROCODONE-ACETAMINOPHEN 5-325 MG PO TABS: 1.0000 | ORAL_TABLET | ORAL | Status: DC | PRN

## 2021-01-14 MED ORDER — ONDANSETRON HCL 4 MG/2ML IJ SOLN: 4.0000 mg | Freq: Four times a day (QID) | INTRAMUSCULAR | Status: DC | PRN

## 2021-01-14 NOTE — ED Notes (Signed)
This RN to bedside, pt noted to be pulling all cardiac leads off repeatedly despite patient's wife being at bedside. Pt's wife states she is unable to keep patient from pulling at leads. Bilateral mittens applied by this RN. Pt noted to become increasingly agitated at this time with mittens on. EDP made aware.

## 2021-01-14 NOTE — ED Provider Notes (Signed)
[facility] Emergency Department Provider Note  ____________________________________________   Event Date/Time   First MD Initiated Contact with Patient 01/14/21 2035     (approximate)  I have reviewed the triage vital signs and the nursing notes.   HISTORY  Chief Complaint Chest Pain   HPI Ernest [name] is a 85 y.o. male with a past medical history of dementia, CAD, A. fib on Eliquis and diltiazem, HTN, HDL, discoid lupus, DM, GERD, PVD and CVA who presents EMS from home for assessment of shortness of breath.  Patient is unemployed and history secondary to dementia stating he has no complaints and is not sure where he is or why he is here.  His wife arrived shortly after patient at bedside he states the patient took all his medicines today but developed some shortness of breath earlier this evening.  Per wife no witnessed recent falls, cough, fevers, vomiting, diarrhea or other recent sick symptoms.  No other acute concerns at this time.  In route patient reported received 150 mg of amiodarone for brief run of V. tach.         Past Medical History:  Diagnosis Date   Aortic atherosclerosis (HCC)    Arthritis    BPH (benign prostatic hypertrophy)    Coronary artery disease    a. Cath 12/2013->[facility]->Med rx.   Deviated nasal septum    Diabetes mellitus without complication (HCC)    type 2   Essential hypertension    GERD (gastroesophageal reflux disease)    Hearing deficit    wears hearing aids   Hyperlipidemia    Lupus (HCC)    discoid of scalp only   PAF (paroxysmal atrial fibrillation) ([place])    a. CHA2DS2VASc = 7-->eliquis;  b. 03/2015 Echo: EF 55-60%, Gr1 DD, mild MR, mildly dil LA, PASP 55mmHg.   Pneumonia    as a child   Presence of permanent cardiac pacemaker 08/16/2015   PVD (peripheral vascular disease) ([place])    Rosacea    Stroke [facility])    Thyroid disease    Urine frequency     Patient Active Problem List   Diagnosis Date  Noted   Rapid atrial fibrillation ([place]) 01/14/2021   Dementia with behavioral disturbance ([place]) 08/09/2019   Sinus node dysfunction ([place]) 08/16/2015   Bradycardia 08/16/2015   PAF (paroxysmal atrial fibrillation) ([place])    Coronary artery disease    Essential hypertension    Hypertension 03/11/2015   Type 2 diabetes mellitus ([place]) 03/11/2015   GERD (gastroesophageal reflux disease) 03/11/2015   Hyperlipidemia 03/11/2015   BPH (benign prostatic hyperplasia) 03/11/2015   Atrial fibrillation with rapid ventricular response ([place]) 03/11/2015   Atrial fibrillation with RVR ([place]) 03/10/2015   Lumbar stenosis with neurogenic claudication 01/04/2015    Past Surgical History:  Procedure Laterality Date   CARDIAC CATHETERIZATION     [facility] 12/24/13: 30% LM , pLAD, ostial CX. 20% mLAD, mRCA. 70% OM2 (small vessel). Med RX.    CARDIAC CATHETERIZATION     [facility]   [facility]     [facility]   CATARACT EXTRACTION W/ INTRAOCULAR LENS IMPLANT     right   COLONOSCOPY     EP IMPLANTABLE DEVICE N/A 08/16/2015   Procedure: Pacemaker Implant;  Surgeon: Deboraha Sprang, MD;  Location: [place] CV LAB;  Service: Cardiovascular;  Laterality: N/A;   LUMBAR LAMINECTOMY/DECOMPRESSION MICRODISCECTOMY N/A 01/04/2015   Procedure: LUMBAR LAMINECTOMY/DECOMPRESSION MICRODISCECTOMY 3 LEVELS;  Surgeon: Newman Pies, MD;  Location: [place] NEURO ORS;  Service: Neurosurgery;  Laterality: N/A;  L23 L34 L45 laminectomies and foraminotomies   MULTIPLE TOOTH EXTRACTIONS     PROSTATE BIOPSY      Prior to Admission medications   Medication Sig Start Date End Date Taking? Authorizing Provider  apixaban (ELIQUIS) 5 MG TABS tablet Take 1 tablet (5 mg total) by mouth 2 (two) times daily. 12/13/20   Deboraha Sprang, MD  diltiazem (CARDIZEM CD) 120 MG 24 hr capsule Take 1 capsule (120 mg total) by mouth as needed (Take as needed for HR greater than 140 bpm). Take as needed for HR greater than 140 bpm 07/01/20 09/21/21  Deboraha Sprang, MD  divalproex (DEPAKOTE ER) 250 MG 24 hr tablet Take 250 mg by mouth daily.  12/29/19 12/28/20  [provider]  glipiZIDE (GLUCOTROL XL) 5 MG 24 hr tablet Take 10 mg by mouth daily with breakfast. 01/13/19 12/05/21  [provider]  levothyroxine (SYNTHROID) 125 MCG tablet Take 125 mcg by mouth daily before breakfast.     [provider]  lovastatin (MEVACOR) 40 MG tablet Take 40 mg by mouth at bedtime.     [provider]  memantine (NAMENDA) 5 MG tablet Take 5 mg by mouth 2 (two) times daily.  02/19/18   [provider]  metronidazole (NORITATE) 1 % cream Apply 1 application topically daily.    [provider]  Multiple Vitamin (MULTIVITAMIN) tablet Take 1 tablet by mouth daily.    [provider]  QUEtiapine (SEROQUEL) 50 MG tablet TAKE 1 TABLET(50 MG) BY MOUTH EVERY NIGHT 07/23/20   [provider]  sertraline (ZOLOFT) 25 MG tablet Take 25 mg by mouth daily.  12/12/18   [provider]  tamsulosin (FLOMAX) 0.4 MG CAPS capsule Take 0.4 mg by mouth 2 (two) times daily.    [provider]    Allergies Patient has no known allergies.  Family History  Problem Relation Age of Onset   Heart attack Mother    Heart attack Father    Cancer - Lung Sister    Cancer - Lung Brother    Cancer - Lung Brother    Cancer Brother    Diabetes Sister     Social History Social History   Tobacco Use   Smoking status: Former    Pack years: 0.00    Types: Cigarettes   Smokeless tobacco: Never   Tobacco comments:    " Quit smoking cigarettes in 1983 "  Vaping Use   Vaping Use: Former  Substance Use Topics   Alcohol use: No   Drug use: No    Review of Systems  Review of Systems  Unable to perform ROS: Dementia     ____________________________________________   PHYSICAL EXAM:  VITAL SIGNS: ED Triage Vitals  Enc Vitals Group     BP      Pulse      Resp      Temp      Temp src      SpO2       Weight      Height      Head Circumference      Peak Flow      Pain Score      Pain Loc      Pain Edu?      Excl. in [place]?    Vitals:   01/14/21 2034 01/14/21 2220  BP: 107/65 133/81  Pulse: (!) 120 81  Resp: 18 19  Temp: 98.2 F (  36.8 C)   SpO2: 100% 100%   Physical Exam Vitals and nursing note reviewed.  Constitutional:      Appearance: He is well-developed.  HENT:     Head: Normocephalic and atraumatic.     Right Ear: External ear normal.     Left Ear: External ear normal.     Nose: Nose normal.  Eyes:     Conjunctiva/sclera: Conjunctivae normal.  Cardiovascular:     Rate and Rhythm: Tachycardia present.     Heart sounds: No murmur heard. Pulmonary:     Effort: Pulmonary effort is normal. No respiratory distress.     Breath sounds: Normal breath sounds.  Abdominal:     Palpations: Abdomen is soft.     Tenderness: There is no abdominal tenderness.  Musculoskeletal:     Cervical back: Neck supple.  Skin:    General: Skin is warm and dry.  Neurological:     Mental Status: He is alert. He is disoriented and confused.  Psychiatric:        Mood and Affect: Mood normal.     ____________________________________________   LABS (all labs ordered are listed, but only abnormal results are displayed)  Labs Reviewed  CBC WITH DIFFERENTIAL/PLATELET - Abnormal; Notable for the following components:      Result Value   HCT 38.0 (*)    Platelets 141 (*)    All other components within normal limits  COMPREHENSIVE METABOLIC PANEL - Abnormal; Notable for the following components:   Sodium 134 (*)    Glucose, Bld 261 (*)    BUN 25 (*)    Total Protein 6.3 (*)    All other components within normal limits  RESP PANEL BY RT-PCR (FLU A&B, COVID) ARPGX2  MAGNESIUM  TSH  BRAIN NATRIURETIC PEPTIDE  TROPONIN I (HIGH SENSITIVITY)  TROPONIN I (HIGH SENSITIVITY)   ____________________________________________  EKG  A. fib with a rate of 122, normal axis, unremarkable  intervals without clearance of acute ischemia.  Right axis deviation. ____________________________________________  RADIOLOGY  ED MD interpretation: No focal consolidation, large effusion, significant edema, pneumothorax or any other clear acute intrathoracic process.  Official radiology report(s): DG Chest Portable 1 View  Result Date: 01/14/2021 CLINICAL DATA:  85 year old male with chest pain. EXAM: PORTABLE CHEST 1 VIEW COMPARISON:  Chest radiograph dated 03/20/2020. FINDINGS: Shallow inspiration. No focal consolidation, pleural effusion, pneumothorax. Cardiac silhouette is within. Atherosclerotic calcification of the aorta. Left pectoral pacemaker device. No acute osseous pathology. IMPRESSION: No active cardiopulmonary disease. Electronically Signed   By: Anner Crete M.D.   On: 01/14/2021 20:53    ____________________________________________   PROCEDURES  Procedure(s) performed (including Critical Care):  .Critical Care  Date/Time: 01/14/2021 10:39 PM Performed by: Lucrezia Starch, MD Authorized by: Lucrezia Starch, MD   Critical care provider statement:    Critical care time (minutes):  45   Critical care was necessary to treat or prevent imminent or life-threatening deterioration of the following conditions:  Cardiac failure   Critical care was time spent personally by me on the following activities:  Discussions with consultants, evaluation of patient's response to treatment, examination of patient, ordering and performing treatments and interventions, ordering and review of laboratory studies, ordering and review of radiographic studies, pulse oximetry, re-evaluation of patient's condition, obtaining history from patient or surrogate and review of old charts   ____________________________________________   INITIAL IMPRESSION / [place] / ED COURSE      Patient presents with above to history exam for  assessment of some shortness of breath that he was  complained to his wife earlier this evening.  He was transported EMS report he had a brief run of V. tach which responded 250 mg of amiodarone.  On arrival patient is tachycardic in the 120s with otherwise stable vital signs on room air.  He is denying any acute complaints and is little fidgety but otherwise calm and not in apparent acute distress.  His lungs are clear bilaterally and his abdomen is soft.  Chest x-ray shows no evidence of acute heart failure or pneumonia or other clear acute thoracic process.  CBC shows no leukocytosis or acute anemia.  CMP shows no significant electrode or metabolic derangements with the patient is mildly hyperglycemic at 261.  No evidence of DKA.  Magnesium is 1.9.  Troponin is nonelevated at 6 and overall I have low suspicion for ACS.  COVID and flu is negative.  TSH is unremarkable.  BNP is double digits.  Overall unclear allergy precipitating patient's RVR today although he was appropriately rate controlled on diltiazem.  Will admit to medicine service for further evaluation and management.  He was given a small dose of Haldol to To prevent him from pulling out his IV and pulling wires off his chest wall in the emergency room.  _____________________________   FINAL CLINICAL IMPRESSION(S) / ED DIAGNOSES  Final diagnoses:  Atrial fibrillation with RVR (HCC)    Medications  diltiazem (CARDIZEM) 1 mg/mL load via infusion 10 mg (10 mg Intravenous Bolus from Bag 01/14/21 2219)    And  diltiazem (CARDIZEM) 125 mg in dextrose 5% 125 mL (1 mg/mL) infusion (5 mg/hr Intravenous New Bag/Given 01/14/21 2219)  haloperidol lactate (HALDOL) injection 1 mg (1 mg Intravenous Given 01/14/21 2216)     ED Discharge Orders     None        Note:  This document was prepared using Dragon voice recognition software and may include unintentional dictation errors.    Lucrezia Starch, MD 01/14/21 [phone]

## 2021-01-14 NOTE — ED Notes (Signed)
Rn attempted to get blood work, pt agitated, unable to get blood work at this time.

## 2021-01-14 NOTE — ED Notes (Signed)
Patient trying to remove leads and BP cuff. Mittens placed on patient but after time patient able to get mittens off. Sitter now at bedside to help redirect patient from pulling things off. Wife at bedside.

## 2021-01-14 NOTE — ED Triage Notes (Signed)
Patient arrives via [facility] from home for CP and SOB. Patient was found in A fib with runs of v tach with EMS. Patient is alert but unable to answer questions due to hx of dementia.

## 2021-01-14 NOTE — H&P (Signed)
History and Physical    Ernest [name] VEH:[id number] DOB: 12-28-1935 DOA: 01/14/2021  PCP: Derinda Late, MD   Patient coming from: Home  I have personally briefly reviewed patient's old medical records in [place]  Chief Complaint: Shortness of breath  HPI: Ernest [name] is a 85 y.o. male with medical history significant for Paroxysmal A. fib on Eliquis, dementia, BPH, hypothyroidism, type 2 diabetes and hypertension who was brought to the ED by EMS with a complaint of shortness of breath.  History is limited due to the dementia and is taken mostly from ED provider who went and got history from wife who states that patient was in his usual state of health until earlier in the evening when he developed shortness of breath.  He had no prior cough, fever or chills.  Had no complaints of chest pain, vomiting or diarrhea.  With EMS patient had a brief run of V. tach and received amiodarone 150 mg IV in route. ED course: On arrival he was afebrile with heart rate 130 with A. fib on monitor.  BP 107/65, respiratory rate 18 with O2 sat 100% on room air.  Blood work with troponin of 6 and BNP 87.6.  Magnesium and potassium within normal limits.  TSH normal at 0.913.  Other labs unremarkable EKG, personally viewed and interpreted A. fib at 125 with no acute ST-T wave changes Imaging: Chest x-ray with no active cardiopulmonary disease  Patient was treated with a diltiazem bolus and placed on an infusion.  Hospitalist consulted for admission  Review of Systems: Unreliable due to dementia   Past Medical History:  Diagnosis Date   Aortic atherosclerosis ([place])    Arthritis    BPH (benign prostatic hypertrophy)    Coronary artery disease    a. Cath 12/2013->[facility]->Med rx.   Deviated nasal septum    Diabetes mellitus without complication (HCC)    type 2   Essential hypertension    GERD (gastroesophageal reflux disease)    Hearing deficit    wears hearing aids   Hyperlipidemia     Lupus (HCC)    discoid of scalp only   PAF (paroxysmal atrial fibrillation) ([place])    a. CHA2DS2VASc = 7-->eliquis;  b. 03/2015 Echo: EF 55-60%, Gr1 DD, mild MR, mildly dil LA, PASP 27mmHg.   Pneumonia    as a child   Presence of permanent cardiac pacemaker 08/16/2015   PVD (peripheral vascular disease) ([place])    Rosacea    Stroke ([place])    Thyroid disease    Urine frequency     Past Surgical History:  Procedure Laterality Date   CARDIAC CATHETERIZATION     [facility] 12/24/13: 30% LM , pLAD, ostial CX. 20% mLAD, mRCA. 70% OM2 (small vessel). Med RX.    CARDIAC CATHETERIZATION     [facility]   CARD[facility]AC CAT[facility]TERIZATION     [facility]   CATARACT EXTRACTION W/ INTRAOCULAR LENS IMPLANT     right   COLONOSCOPY     EP IMPLANTABLE DEVICE N/A 08/16/2015   Procedure: Pacemaker Implant;  Surgeon: Deboraha Sprang, MD;  Location: [place] [facility];  Service: Cardiovascular;  Laterality: N/A;   LUMBAR LAMINECTOMY/DECOMPRESSION MICRODISCECTOMY N/A 01/04/2015   Procedure: LUMBAR LAMINECTOMY/DECOMPRESSION MICRODISCECTOMY 3 LEVELS;  Surgeon: Newman Pies, MD;  Location: [place] [facility];  Service: Neurosurgery;  Laterality: N/A;  L23 L34 L45 laminectomies and foraminotomies   MULTIPLE TOOTH EXTRACTIONS     PROSTATE BIOPSY       reports that he  has quit smoking. His smoking use included cigarettes. He has never used smokeless tobacco. He reports that he does not drink alcohol and does not use drugs.  No Known Allergies  Family History  Problem Relation Age of Onset   Heart attack Mother    Heart attack Father    Cancer - Lung Sister    Cancer - Lung Brother    Cancer - Lung Brother    Cancer Brother    Diabetes Sister       Prior to Admission medications   Medication Sig Start Date End Date Taking? Authorizing Provider  apixaban (ELIQUIS) 5 MG TABS tablet Take 1 tablet (5 mg total) by mouth 2 (two) times daily. 12/13/20   Deboraha Sprang, MD  diltiazem (CARDIZEM CD) 120 MG 24 hr capsule Take 1 capsule (120 mg  total) by mouth as needed (Take as needed for HR greater than 140 bpm). Take as needed for HR greater than 140 bpm 07/01/20 09/21/21  Deboraha Sprang, MD  divalproex (DEPAKOTE ER) 250 MG 24 hr tablet Take 250 mg by mouth daily.  12/29/19 12/28/20  [provider]  glipiZIDE (GLUCOTROL XL) 5 MG 24 hr tablet Take 10 mg by mouth daily with breakfast. 01/13/19 12/05/21  [provider]  levothyroxine (SYNTHROID) 125 MCG tablet Take 125 mcg by mouth daily before breakfast.     [provider]  lovastatin (MEVACOR) 40 MG tablet Take 40 mg by mouth at bedtime.     [provider]  memantine (NAMENDA) 5 MG tablet Take 5 mg by mouth 2 (two) times daily.  02/19/18   [provider]  metronidazole (NORITATE) 1 % cream Apply 1 application topically daily.    [provider]  Multiple Vitamin (MULTIVITAMIN) tablet Take 1 tablet by mouth daily.    [provider]  QUEtiapine (SEROQUEL) 50 MG tablet TAKE 1 TABLET(50 MG) BY MOUTH EVERY NIGHT 07/23/20   [provider]  sertraline (ZOLOFT) 25 MG tablet Take 25 mg by mouth daily.  12/12/18   [provider]  tamsulosin (FLOMAX) 0.4 MG CAPS capsule Take 0.4 mg by mouth 2 (two) times daily.    [provider]    Physical Exam: Vitals:   01/14/21 2034 01/14/21 2036 01/14/21 2220  BP: 107/65  133/81  Pulse: (!) 120  81  Resp: 18  19  Temp: 98.2 F (36.8 C)    TempSrc: Oral    SpO2: 100%  100%  Weight:  75.6 kg   Height:  5\' 7"  (1.702 m)      Vitals:   01/14/21 2034 01/14/21 2036 01/14/21 2220  BP: 107/65  133/81  Pulse: (!) 120  81  Resp: 18  19  Temp: 98.2 F (36.8 C)    TempSrc: Oral    SpO2: 100%  100%  Weight:  75.6 kg   Height:  5\' 7"  (1.702 m)       Constitutional: Alert and oriented x 1. Not in any apparent distress HEENT:      Head: Normocephalic and atraumatic.         Eyes: PERLA, EOMI, Conjunctivae are normal. Sclera is non-icteric.       Mouth/Throat:  Mucous membranes are moist.       Neck: Supple with no signs of meningismus. Cardiovascular: Tachycardic, irregular, no murmurs, gallops, or rubs. 2+ symmetrical distal pulses are present . No JVD. No LE edema Respiratory: Respiratory effort normal .Lungs sounds clear bilaterally. No wheezes, crackles, or  rhonchi.  Gastrointestinal: Soft, non tender, and non distended with positive bowel sounds.  Genitourinary: No CVA tenderness. Musculoskeletal: Nontender with normal range of motion in all extremities. No cyanosis, or erythema of extremities. Neurologic:  Face is symmetric. Moving all extremities. No gross focal neurologic deficits . Skin: Skin is warm, dry.  No rash or ulcers Psychiatric: Mood and affect are normal,    Labs on Admission: I have personally reviewed following labs and imaging studies  CBC: Recent Labs  Lab 01/14/21 2041  WBC 7.5  NEUTROABS 4.9  HGB 13.6  HCT 38.0*  MCV 85.4  PLT 626*   Basic Metabolic Panel: Recent Labs  Lab 01/14/21 2041  NA 134*  K 4.1  CL 104  CO2 25  GLUCOSE 261*  BUN 25*  CREATININE 0.84  CALCIUM 9.4  MG 1.9   GFR: Estimated Creatinine Clearance: 63.4 mL/min (by C-G formula based on SCr of 0.84 mg/dL). Liver Function Tests: Recent Labs  Lab 01/14/21 2041  AST 21  ALT 22  ALKPHOS 86  BILITOT 0.6  PROT 6.3*  ALBUMIN 3.6   No results for input(s): LIPASE, AMYLASE in the last 168 hours. No results for input(s): AMMONIA in the last 168 hours. Coagulation Profile: No results for input(s): INR, PROTIME in the last 168 hours. Cardiac Enzymes: No results for input(s): CKTOTAL, CKMB, CKMBINDEX, TROPONINI in the last 168 hours. BNP (last 3 results) No results for input(s): PROBNP in the last 8760 hours. HbA1C: No results for input(s): HGBA1C in the last 72 hours. CBG: No results for input(s): GLUCAP in the last 168 hours. Lipid Profile: No results for input(s): CHOL, HDL, LDLCALC, TRIG, CHOLHDL, LDLDIRECT in the last 72  hours. Thyroid Function Tests: Recent Labs    01/14/21 2045  TSH 0.913   Anemia Panel: No results for input(s): VITAMINB12, FOLATE, FERRITIN, TIBC, IRON, RETICCTPCT in the last 72 hours. Urine analysis:    Component Value Date/Time   COLORURINE YELLOW (A) 08/09/2019 0109   APPEARANCEUR CLEAR (A) 08/09/2019 0109   APPEARANCEUR Clear 09/17/2013 2326   LABSPEC 1.017 08/09/2019 0109   LABSPEC 1.007 09/17/2013 2326   PHURINE 5.0 08/09/2019 0109   GLUCOSEU >=500 (A) 08/09/2019 0109   GLUCOSEU >=500 09/17/2013 2326   HGBUR NEGATIVE 08/09/2019 0109   BILIRUBINUR NEGATIVE 08/09/2019 0109   BILIRUBINUR Negative 09/17/2013 2326   KETONESUR NEGATIVE 08/09/2019 0109   PROTEINUR NEGATIVE 08/09/2019 0109   NITRITE NEGATIVE 08/09/2019 0109   LEUKOCYTESUR NEGATIVE 08/09/2019 0109   LEUKOCYTESUR Negative 09/17/2013 2326    Radiological Exams on Admission: DG Chest Portable 1 View  Result Date: 01/14/2021 CLINICAL DATA:  85 year old male with chest pain. EXAM: PORTABLE CHEST 1 VIEW COMPARISON:  Chest radiograph dated 03/20/2020. FINDINGS: Shallow inspiration. No focal consolidation, pleural effusion, pneumothorax. Cardiac silhouette is within. Atherosclerotic calcification of the aorta. Left pectoral pacemaker device. No acute osseous pathology. IMPRESSION: No active cardiopulmonary disease. Electronically Signed   By: Anner Crete M.D.   On: 01/14/2021 20:53     Assessment/Plan 85 year old male with history of paroxysmal A. fib on Eliquis, dementia, BPH, hypothyroidism, type 2 diabetes and hypertension who was brought to the ED by EMS with a complaint of shortness of breath.     Atrial fibrillation with RVR - Patient presents with shortness of breath, without chest pain, rate of 133 on arrival - Continue diltiazem infusion and transition to oral per parameters - Continue Eliquis for primary stroke prevention  Nonsustained V. tach with EMS - Patient reportedly had a  run of V. tach with  EMS and received amiodarone 150 mg IV - Potassium 4.1 and magnesium 1.9 - Continue cardiac monitoring    Hypertension - Continue with oral diltiazem once transitioned from IV    Type 2 diabetes mellitus (HCC) - Sliding scale insulin for now to replace home oral glipizide    BPH (benign prostatic hyperplasia) - Stable.  Continue tamsulosin  Hypothyroidism - Continue levothyroxine    Dementia with behavioral disturbance ([place]) - Patient was pulling off leads while in the ED - Continue Zoloft, quetiapine, memantine and Depakote pending med rec    DVT prophylaxis: Continue Eliquis Code Status: full code  Family Communication:  none  Disposition Plan: Back to previous home environment Consults called: cardiology Status: Observation    Athena Masse MD [facility]     01/14/2021, 10:49 PM

## 2021-01-15 DIAGNOSIS — R0600 Dyspnea, unspecified: Secondary | ICD-10-CM | POA: Diagnosis not present

## 2021-01-15 DIAGNOSIS — I4891 Unspecified atrial fibrillation: Secondary | ICD-10-CM

## 2021-01-15 LAB — CBC
HCT: 38.6 % — ABNORMAL LOW (ref 39.0–52.0)
Hemoglobin: 13.3 g/dL (ref 13.0–17.0)
MCH: 30.2 pg (ref 26.0–34.0)
MCHC: 34.5 g/dL (ref 30.0–36.0)
MCV: 87.5 fL (ref 80.0–100.0)
Platelets: 162 10*3/uL (ref 150–400)
RBC: 4.41 MIL/uL (ref 4.22–5.81)
RDW: 12.6 % (ref 11.5–15.5)
WBC: 9.5 10*3/uL (ref 4.0–10.5)
nRBC: 0 % (ref 0.0–0.2)

## 2021-01-15 LAB — TROPONIN I (HIGH SENSITIVITY): Troponin I (High Sensitivity): 8 ng/L (ref <18)

## 2021-01-15 LAB — HEMOGLOBIN A1C
Hgb A1c MFr Bld: 8.5 % — ABNORMAL HIGH (ref 4.8–5.6)
Mean Plasma Glucose: 197.25 mg/dL

## 2021-01-15 LAB — CBG MONITORING, ED
Glucose-Capillary: 139 mg/dL — ABNORMAL HIGH (ref 70–99)
Glucose-Capillary: 184 mg/dL — ABNORMAL HIGH (ref 70–99)

## 2021-01-15 LAB — BASIC METABOLIC PANEL
Anion gap: 5 (ref 5–15)
BUN: 23 mg/dL (ref 8–23)
CO2: 27 mmol/L (ref 22–32)
Calcium: 9.8 mg/dL (ref 8.9–10.3)
Chloride: 108 mmol/L (ref 98–111)
Creatinine, Ser: 0.72 mg/dL (ref 0.61–1.24)
GFR, Estimated: >60 mL/min (ref >60)
Glucose, Bld: 155 mg/dL — ABNORMAL HIGH (ref 70–99)
Potassium: 3.9 mmol/L (ref 3.5–5.1)
Sodium: 140 mmol/L (ref 135–145)

## 2021-01-15 MED ORDER — ATORVASTATIN CALCIUM 40 MG PO TABS: 40.0000 mg | ORAL_TABLET | Freq: Every day | ORAL | 11 refills | Status: AC

## 2021-01-15 MED ORDER — APIXABAN 5 MG PO TABS
5.0000 mg | ORAL_TABLET | Freq: Two times a day (BID) | ORAL | Status: DC
  Administered 2021-01-15: 5 mg via ORAL
  Filled 2021-01-15 (×2): qty 1

## 2021-01-15 MED ORDER — HALOPERIDOL LACTATE 5 MG/ML IJ SOLN
1.5000 mg | Freq: Four times a day (QID) | INTRAMUSCULAR | Status: DC | PRN
  Administered 2021-01-15 (×2): 1.5 mg via INTRAVENOUS
  Filled 2021-01-15 (×2): qty 1

## 2021-01-15 MED ORDER — DILTIAZEM HCL ER COATED BEADS 120 MG PO CP24: 120.0000 mg | ORAL_CAPSULE | Freq: Every day | ORAL | 3 refills | Status: AC

## 2021-01-15 MED ORDER — LORAZEPAM 2 MG/ML IJ SOLN: 1.0000 mg | Freq: Once | INTRAMUSCULAR | Status: DC

## 2021-01-15 MED ORDER — DILTIAZEM HCL 60 MG PO TABS
30.0000 mg | ORAL_TABLET | Freq: Four times a day (QID) | ORAL | Status: DC
  Administered 2021-01-15 (×2): 30 mg via ORAL
  Filled 2021-01-15 (×2): qty 1

## 2021-01-15 NOTE — ED Notes (Signed)
Pt noted to be extremely agitated, kicking and hitting at staff and attempting to pull out his IV. Medication administered as ordered. Sitter back to bedside.

## 2021-01-15 NOTE — Progress Notes (Signed)
Discharge instructions given to wife at bedside. Verbalized understanding. No acute distress at this time. Patient dressed and placed in wheelchair with difficulty as patient unable to follow commands. Expressed concern to wife about her ability to take care of patient when they get home and wife stated she would be fine that she would get help to get patient out of car when she got home and that the patient is currently being followed by palliative care.

## 2021-01-15 NOTE — ED Notes (Signed)
Pt resting comfortably in bed at this time with wife at bedside. Pt with NSR at this time with HR of 60. VSS at this time.

## 2021-01-15 NOTE — Discharge Summary (Signed)
Physician Discharge Summary  Ernest [name] HAL:[id number] DOB: 03-18-1936 DOA: 01/14/2021  PCP: Derinda Late, MD  Admit date: 01/14/2021 Discharge date: 01/15/2021  Admitted From: Home Disposition:  Home  Recommendations for Outpatient Follow-up:  Follow up with Cardiologist Dr. Caryl Comes in 1-2 weeks   Discharge Condition: Stable Code Status:   Code Status: Prior Diet recommendation:  Diet Order             Diet - low sodium heart healthy                    Brief/Interim Summary: 85 year old male with Advanced Dementia and PAF and SSS with PPM who was admitted for fatigue found to be in Afib with HR > 120 beats/min.  He denies chest pain, shortness of breath, palpitations, dizziness.  Exam is euvolemic.  He was placed on a Diltiazem Drip.  The following morning I stopped drip and started him on Diltiazem 30 mg q6hrs.  His HR stabilized to 60-70 beats/min.  Patient was evaluated by Cardiology and deemed stable.  He was discharged and will follow up with his Cardiologist Dr. Caryl Comes in 1-2 weeks.  Instructions were provided to take Diltiazem 120 mg daily for rate control and Eliquis 5 mg BID for anticoagulation.  Patient was discharged home with wife in stable condition.   Discharge Diagnoses:  Active Problems:   Atrial fibrillation with RVR (HCC)   Hypertension   Type 2 diabetes mellitus (HCC)   BPH (benign prostatic hyperplasia)   Coronary artery disease   Essential hypertension   Dementia with behavioral disturbance ([place])      Consults: Cardiology  Subjective: Stable. Discharge Exam: Vitals:   01/15/21 1100 01/15/21 1200  BP: (!) 156/105 137/65  Pulse: 73 (!) 59  Resp: 20 (!) 24  Temp:  98.1 F (36.7 C)  SpO2: 100% 99%   General: Pt is alert, awake, not in acute distress Cardiovascular: RRR, S1/S2 +, no rubs, no gallops Respiratory: CTA bilaterally, no wheezing, no rhonchi Abdominal: Soft, NT, ND, bowel sounds + Extremities: no edema, no  cyanosis  Discharge Instructions  Discharge Instructions     Amb referral to AFIB Clinic   Complete by: As directed    Ambulatory referral to Cardiology   Complete by: As directed    Diet - low sodium heart healthy   Complete by: As directed    Discharge instructions   Complete by: As directed    Please take Diltiazem 120 mg once daily.   Please take Eliquis 5 mg twice daily. These medications are for treatment of Atrial Fibrillation.  Please stop Lovastatin. (This medication interacts with Diltiazem). Start Atorvastatin 40 mg daily.  Please follow up with Cardiologist Dr. Caryl Comes in 1-2 weeks.   Increase activity slowly   Complete by: As directed       Allergies as of 01/15/2021   No Known Allergies      Medication List     STOP taking these medications    lovastatin 40 MG tablet Commonly known as: MEVACOR       TAKE these medications    atorvastatin 40 MG tablet Commonly known as: Lipitor Take 1 tablet (40 mg total) by mouth daily.   diltiazem 120 MG 24 hr capsule Commonly known as: CARDIZEM CD Take 1 capsule (120 mg total) by mouth daily. Take as needed for HR greater than 140 bpm What changed:  when to take this reasons to take this   Eliquis 5 MG Tabs  tablet Generic drug: apixaban Take 1 tablet (5 mg total) by mouth 2 (two) times daily.   glipiZIDE 5 MG 24 hr tablet Commonly known as: GLUCOTROL XL Take 10 mg by mouth daily with breakfast.   levothyroxine 125 MCG tablet Commonly known as: SYNTHROID Take 125 mcg by mouth daily before breakfast.   memantine 5 MG tablet Commonly known as: NAMENDA Take 5 mg by mouth 2 (two) times daily.   metronidazole 1 % cream Commonly known as: NORITATE Apply 1 application topically daily.   multivitamin tablet Take 1 tablet by mouth daily.   QUEtiapine 50 MG tablet Commonly known as: SEROQUEL TAKE 1 TABLET(50 MG) BY MOUTH EVERY NIGHT   sertraline 25 MG tablet Commonly known as: ZOLOFT Take 25 mg by  mouth daily.   tamsulosin 0.4 MG Caps capsule Commonly known as: FLOMAX Take 0.4 mg by mouth 2 (two) times daily.        No Known Allergies  The results of significant diagnostics from this hospitalization (including imaging, microbiology, ancillary and laboratory) are listed below for reference.    Microbiology: Recent Results (from the past 240 hour(s))  Resp Panel by RT-PCR (Flu A&B, Covid) Nasopharyngeal Swab     Status: None   Collection Time: 01/14/21  8:41 PM   Specimen: Nasopharyngeal Swab; Nasopharyngeal(NP) swabs in vial transport medium  Result Value Ref Range Status   SARS Coronavirus 2 by RT PCR NEGATIVE NEGATIVE Final    Comment: (NOTE) SARS-CoV-2 target nucleic acids are NOT DETECTED.  The SARS-CoV-2 RNA is generally detectable in upper respiratory specimens during the acute phase of infection. The lowest concentration of SARS-CoV-2 viral copies this assay can detect is 138 copies/mL. A negative result does not preclude SARS-Cov-2 infection and should not be used as the sole basis for treatment or other patient management decisions. A negative result may occur with  improper specimen collection/handling, submission of specimen other than nasopharyngeal swab, presence of viral mutation(s) within the areas targeted by this assay, and inadequate number of viral copies(<138 copies/mL). A negative result must be combined with clinical observations, patient history, and epidemiological information. The expected result is Negative.  Fact Sheet for Patients:  EntrepreneurPulse.com.au  Fact Sheet for Healthcare Providers:  IncredibleEmployment.be  This test is no t yet approved or cleared by the [place] FDA and  has been authorized for detection and/or diagnosis of SARS-CoV-2 by FDA under an Emergency Use Authorization (EUA). This EUA will remain  in effect (meaning this test can be used) for the duration of the COVID-19  declaration under Section 564(b)(1) of the Act, 21 U.S.C.section 360bbb-3(b)(1), unless the authorization is terminated  or revoked sooner.       Influenza A by PCR NEGATIVE NEGATIVE Final   Influenza B by PCR NEGATIVE NEGATIVE Final    Comment: (NOTE) The Xpert Xpress SARS-CoV-2/FLU/RSV plus assay is intended as an aid in the diagnosis of influenza from Nasopharyngeal swab specimens and should not be used as a sole basis for treatment. Nasal washings and aspirates are unacceptable for Xpert Xpress SARS-CoV-2/FLU/RSV testing.  Fact Sheet for Patients: EntrepreneurPulse.com.au  Fact Sheet for Healthcare Providers: IncredibleEmployment.be  This test is not yet approved or cleared by the [place] FDA and has been authorized for detection and/or diagnosis of SARS-CoV-2 by FDA under an Emergency Use Authorization (EUA). This EUA will remain in effect (meaning this test can be used) for the duration of the COVID-19 declaration under Section 564(b)(1) of the Act, 21 U.S.C. section 360bbb-3(b)(1), unless  the authorization is terminated or revoked.  Performed at [facility], [address] [place], [place] [address]     Procedures/Studies: DG Chest Portable 1 View  Result Date: 01/14/2021 CLINICAL DATA:  85 year old male with chest pain. EXAM: PORTABLE CHEST 1 VIEW COMPARISON:  Chest radiograph dated 03/20/2020. FINDINGS: Shallow inspiration. No focal consolidation, pleural effusion, pneumothorax. Cardiac silhouette is within. Atherosclerotic calcification of the aorta. Left pectoral pacemaker device. No acute osseous pathology. IMPRESSION: No active cardiopulmonary disease. Electronically Signed   By: Anner Crete M.D.   On: 01/14/2021 20:53    Labs: BNP (last 3 results) Recent Labs    03/20/20 0356 01/14/21 2041  BNP 64.8 08.1   Basic Metabolic Panel: Recent Labs  Lab 01/14/21 2041 01/15/21 0512  NA 134* 140  K 4.1  3.9  CL 104 108  CO2 25 27  GLUCOSE 261* 155*  BUN 25* 23  CREATININE 0.84 0.72  CALCIUM 9.4 9.8  MG 1.9  --    Liver Function Tests: Recent Labs  Lab 01/14/21 2041  AST 21  ALT 22  ALKPHOS 86  BILITOT 0.6  PROT 6.3*  ALBUMIN 3.6    CBC: Recent Labs  Lab 01/14/21 2041 01/15/21 0512  WBC 7.5 9.5  NEUTROABS 4.9  --   HGB 13.6 13.3  HCT 38.0* 38.6*  MCV 85.4 87.5  PLT 141* 162    CBG: Recent Labs  Lab 01/15/21 0741 01/15/21 1158  GLUCAP 139* 184*    Hgb A1c Recent Labs    01/14/21 2336  HGBA1C 8.5*    Thyroid function studies Recent Labs    01/14/21 2045  TSH 0.913    Urinalysis    Component Value Date/Time   COLORURINE YELLOW (A) 08/09/2019 0109   APPEARANCEUR CLEAR (A) 08/09/2019 0109   APPEARANCEUR Clear 09/17/2013 2326   LABSPEC 1.017 08/09/2019 0109   LABSPEC 1.007 09/17/2013 2326   PHURINE 5.0 08/09/2019 0109   GLUCOSEU >=500 (A) 08/09/2019 0109   GLUCOSEU >=500 09/17/2013 2326   HGBUR NEGATIVE 08/09/2019 0109   BILIRUBINUR NEGATIVE 08/09/2019 0109   BILIRUBINUR Negative 09/17/2013 2326   KETONESUR NEGATIVE 08/09/2019 0109   PROTEINUR NEGATIVE 08/09/2019 0109   NITRITE NEGATIVE 08/09/2019 0109   LEUKOCYTESUR NEGATIVE 08/09/2019 0109   LEUKOCYTESUR Negative 09/17/2013 2326   Microbiology Recent Results (from the past 240 hour(s))  Resp Panel by RT-PCR (Flu A&B, Covid) Nasopharyngeal Swab     Status: None   Collection Time: 01/14/21  8:41 PM   Specimen: Nasopharyngeal Swab; Nasopharyngeal(NP) swabs in vial transport medium  Result Value Ref Range Status   SARS Coronavirus 2 by RT PCR NEGATIVE NEGATIVE Final    Comment: (NOTE) SARS-CoV-2 target nucleic acids are NOT DETECTED.  The SARS-CoV-2 RNA is generally detectable in upper respiratory specimens during the acute phase of infection. The lowest concentration of SARS-CoV-2 viral copies this assay can detect is 138 copies/mL. A negative result does not preclude  SARS-Cov-2 infection and should not be used as the sole basis for treatment or other patient management decisions. A negative result may occur with  improper specimen collection/handling, submission of specimen other than nasopharyngeal swab, presence of viral mutation(s) within the areas targeted by this assay, and inadequate number of viral copies(<138 copies/mL). A negative result must be combined with clinical observations, patient history, and epidemiological information. The expected result is Negative.  Fact Sheet for Patients:  EntrepreneurPulse.com.au  Fact Sheet for Healthcare Providers:  IncredibleEmployment.be  This test is no t yet approved  or cleared by the [place] and  has been authorized for detection and/or diagnosis of SARS-CoV-2 by FDA under an Emergency Use Authorization (EUA). This EUA will remain  in effect (meaning this test can be used) for the duration of the COVID-19 declaration under Section 564(b)(1) of the Act, 21 U.S.C.section 360bbb-3(b)(1), unless the authorization is terminated  or revoked sooner.       Influenza A by PCR NEGATIVE NEGATIVE Final   Influenza B by PCR NEGATIVE NEGATIVE Final    Comment: (NOTE) The Xpert Xpress SARS-CoV-2/FLU/RSV plus assay is intended as an aid in the diagnosis of influenza from Nasopharyngeal swab specimens and should not be used as a sole basis for treatment. Nasal washings and aspirates are unacceptable for Xpert Xpress SARS-CoV-2/FLU/RSV testing.  Fact Sheet for Patients: EntrepreneurPulse.com.au  Fact Sheet for Healthcare Providers: IncredibleEmployment.be  This test is not yet approved or cleared by the [place] FDA and has been authorized for detection and/or diagnosis of SARS-CoV-2 by FDA under an Emergency Use Authorization (EUA). This EUA will remain in effect (meaning this test can be used) for the duration of  the COVID-19 declaration under Section 564(b)(1) of the Act, 21 U.S.C. section 360bbb-3(b)(1), unless the authorization is terminated or revoked.  Performed at [facility], [address] [place], [place] [address]      Time coordinating discharge: > 30 minutes  SIGNED: George Hugh, MD  [facility] 01/15/2021, 7:06 PM  If 7PM-7AM, please contact night-coverage [email or web address]

## 2021-01-15 NOTE — ED Notes (Signed)
Dr. Duncan at bedside 

## 2021-01-15 NOTE — ED Notes (Signed)
Pt incontinent of urine. Pt changed and bed linen changed as well. Pt wife at bedside. Pt has no further needs at this time.

## 2021-01-15 NOTE — Plan of Care (Signed)
Transferring to room 233 (2C).  Cruzita Lederer, RN to continue care once arrives.

## 2021-01-15 NOTE — Consult Note (Signed)
CARDIOLOGY CONSULT NOTE       Patient ID: Ernest [name] MRN: [id number] DOB/AGE: 83-Aug-1937 83 y.o.  Admit date: 01/14/2021 Referring Physician: Damita Dunnings Primary Physician: Derinda Late, MD Primary Cardiologist: [place]  Reason for Consultation: PAF/[place]  Active Problems:   Atrial fibrillation with RVR ([place])   Hypertension   Type 2 diabetes mellitus ([place])   BPH (benign prostatic hyperplasia)   Coronary artery disease   Essential hypertension   Dementia with behavioral disturbance ([place])   HPI:  85 y.o. history of SSS with Medtronic pacer and PAF. Significant decline in MS last year with advanced dementia. Admitted with dyspnea and PAF Currently comfortable after just eating breakfast No dyspnea , palpitations , syncope or chest pain He has no significant CAD by cath in 2015 EF has been normal by TTE in past with mild MR.  Telemetry currently showing AV pacing at a rate of 64 bpm CHADVASC 7 maintained on eliquis with fall risk In regard to his dyspnea he is not volume overloaded CXR with NAD BNP only 87 respiratory panel negative as is troponin   ROS All other systems reviewed and negative except as noted above  Past Medical History:  Diagnosis Date   Aortic atherosclerosis (HCC)    Arthritis    BPH (benign prostatic hypertrophy)    Coronary artery disease    a. Cath 12/2013->[facility]->Med rx.   Deviated nasal septum    Diabetes mellitus without complication (HCC)    type 2   Essential hypertension    GERD (gastroesophageal reflux disease)    Hearing deficit    wears hearing aids   Hyperlipidemia    Lupus (HCC)    discoid of scalp only   PAF (paroxysmal atrial fibrillation) ([place])    a. CHA2DS2VASc = 7-->eliquis;  b. 03/2015 Echo: EF 55-60%, Gr1 DD, mild MR, mildly dil LA, PASP 15mmHg.   Pneumonia    as a child   Presence of permanent cardiac pacemaker 08/16/2015   PVD (peripheral vascular disease) ([place])    Rosacea    Stroke ([place])    Thyroid disease     Urine frequency     Family History  Problem Relation Age of Onset   Heart attack Mother    Heart attack Father    Cancer - Lung Sister    Cancer - Lung Brother    Cancer - Lung Brother    Cancer Brother    Diabetes Sister     Social History   Socioeconomic History   Marital status: Married    Spouse name: Not on file   Number of children: Not on file   Years of education: Not on file   Highest education level: Not on file  Occupational History   Not on file  Tobacco Use   Smoking status: Former    Pack years: 0.00    Types: Cigarettes   Smokeless tobacco: Never   Tobacco comments:    " Quit smoking cigarettes in 1983 "  Vaping Use   Vaping Use: Former  Substance and Sexual Activity   Alcohol use: No   Drug use: No   Sexual activity: Not on file  Other Topics Concern   Not on file  Social History Narrative   Not on file   Social Determinants of Health   Financial Resource Strain: Not on file  Food Insecurity: Not on file  Transportation Needs: Not on file  Physical Activity: Not on file  Stress: Not on file  Social  Connections: Not on file  Intimate Partner Violence: Not on file    Past Surgical History:  Procedure Laterality Date   CARDIAC CATHETERIZATION     [facility] 12/24/13: 30% LM , pLAD, ostial CX. 20% mLAD, mRCA. 70% OM2 (small vessel). Med RX.    CARDIAC CATHETERIZATION     [facility]   CARD[facility]AC [facility]TERIZATION     [facility]   CATARACT EXTRACTION W/ INTRAOCULAR LENS IMPLANT     right   COLONOSCOPY     EP IMPLANTABLE DEVICE N/A 08/16/2015   Procedure: Pacemaker Implant;  Surgeon: Deboraha Sprang, MD;  Location: [place] [facility];  Service: Cardiovascular;  Laterality: N/A;   LUMBAR LAMINECTOMY/DECOMPRESSION MICRODISCECTOMY N/A 01/04/2015   Procedure: LUMBAR LAMINECTOMY/DECOMPRESSION MICRODISCECTOMY 3 LEVELS;  Surgeon: Newman Pies, MD;  Location: [place] [facility]UR[facility];  Service: Neurosurgery;  Laterality: N/A;  L23 L34 L45 laminectomies and foraminotomies   MULTIPLE  TOOTH EXTRACTIONS     PROSTATE BIOPSY        Current Facility-Administered Medications:    acetaminophen (TYLENOL) tablet 650 mg, 650 mg, Oral, Q6H PRN **OR** acetaminophen (TYLENOL) suppository 650 mg, 650 mg, Rectal, Q6H PRN, Athena Masse, MD   apixaban [name] tablet 5 mg, 5 mg, Oral, BID, George Hugh, MD, 5 mg at 01/15/21 0943   diltiazem (CARDIZEM) tablet 30 mg, 30 mg, Oral, Q6H, Masoud, Jarrett Soho, MD, 30 mg at 01/15/21 0742   haloperidol lactate (HALDOL) injection 1.5 mg, 1.5 mg, Intravenous, Q6H PRN, Athena Masse, MD, 1.5 mg at 01/15/21 7035   HYDROcodone-acetaminophen (NORCO/VICODIN) 5-325 MG per tablet 1-2 tablet, 1-2 tablet, Oral, Q4H PRN, Athena Masse, MD   insulin aspart (novoLOG) injection 0-15 Units, 0-15 Units, Subcutaneous, TID WC, Athena Masse, MD, 2 Units at 01/15/21 0747   insulin aspart (novoLOG) injection 0-5 Units, 0-5 Units, Subcutaneous, QHS, Athena Masse, MD, 2 Units at 01/15/21 0050   ondansetron (ZOFRAN) tablet 4 mg, 4 mg, Oral, Q6H PRN **OR** ondansetron (ZOFRAN) injection 4 mg, 4 mg, Intravenous, Q6H PRN, Athena Masse, MD  Current Outpatient Medications:    apixaban (ELIQUIS) 5 MG TABS tablet, Take 1 tablet (5 mg total) by mouth 2 (two) times daily., Disp: 180 tablet, Rfl: 1   diltiazem (CARDIZEM CD) 120 MG 24 hr capsule, Take 1 capsule (120 mg total) by mouth as needed (Take as needed for HR greater than 140 bpm). Take as needed for HR greater than 140 bpm, Disp: 30 capsule, Rfl: 3   divalproex (DEPAKOTE ER) 250 MG 24 hr tablet, Take 250 mg by mouth daily. , Disp: , Rfl:    glipiZIDE (GLUCOTROL XL) 5 MG 24 hr tablet, Take 10 mg by mouth daily with breakfast., Disp: , Rfl:    levothyroxine (SYNTHROID) 125 MCG tablet, Take 125 mcg by mouth daily before breakfast. , Disp: , Rfl:    lovastatin (MEVACOR) 40 MG tablet, Take 40 mg by mouth at bedtime. , Disp: , Rfl:    memantine (NAMENDA) 5 MG tablet, Take 5 mg by mouth 2 (two) times daily. , Disp: ,  Rfl:    metFORMIN (GLUCOPHAGE-XR) 500 MG 24 hr tablet, Take 500 mg by mouth 2 (two) times daily., Disp: , Rfl:    metronidazole (NORITATE) 1 % cream, Apply 1 application topically daily., Disp: , Rfl:    Multiple Vitamin (MULTIVITAMIN) tablet, Take 1 tablet by mouth daily., Disp: , Rfl:    QUEtiapine (SEROQUEL) 50 MG tablet, TAKE 1 TABLET(50 MG) BY MOUTH EVERY NIGHT, Disp: , Rfl:  sertraline (ZOLOFT) 25 MG tablet, Take 25 mg by mouth daily. , Disp: , Rfl:    tamsulosin (FLOMAX) 0.4 MG CAPS capsule, Take 0.4 mg by mouth 2 (two) times daily., Disp: , Rfl:   apixaban  5 mg Oral BID   diltiazem  30 mg Oral Q6H   insulin aspart  0-15 Units Subcutaneous TID WC   insulin aspart  0-5 Units Subcutaneous QHS     Physical Exam: Blood pressure 114/60, pulse (!) 59, temperature (!) 97.2 F (36.2 C), temperature source Oral, resp. rate 15, height 5\' 7"  (1.702 m), weight 75.6 kg, SpO2 100 %.    Elderly male Poor memory  Pacer under left clavicle  No murmur Lungs clear Abdomen soft No edema   Labs:   Lab Results  Component Value Date   WBC 9.5 01/15/2021   HGB 13.3 01/15/2021   HCT 38.6 (L) 01/15/2021   MCV 87.5 01/15/2021   PLT 162 01/15/2021    Recent Labs  Lab 01/14/21 2041 01/15/21 0512  NA 134* 140  K 4.1 3.9  CL 104 108  CO2 25 27  BUN 25* 23  CREATININE 0.84 0.72  CALCIUM 9.4 9.8  PROT 6.3*  --   BILITOT 0.6  --   ALKPHOS 86  --   ALT 22  --   AST 21  --   GLUCOSE 261* 155*   Lab Results  Component Value Date   CKTOTAL 62 09/17/2013   CKMB 1.0 09/18/2013   TROPONINI <0.03 08/11/2015    Lab Results  Component Value Date   CHOL 120 08/09/2019   CHOL 128 08/11/2011   Lab Results  Component Value Date   HDL 34 (L) 08/09/2019   HDL 33 (L) 08/11/2011   Lab Results  Component Value Date   LDLCALC 62 08/09/2019   LDLCALC 63 08/11/2011   Lab Results  Component Value Date   TRIG 119 08/09/2019   TRIG 160 08/11/2011   Lab Results  Component Value Date    CHOLHDL 3.5 08/09/2019   No results found for: LDLDIRECT    Radiology: DG Chest Portable 1 View  Result Date: 01/14/2021 CLINICAL DATA:  85 year old male with chest pain. EXAM: PORTABLE CHEST 1 VIEW COMPARISON:  Chest radiograph dated 03/20/2020. FINDINGS: Shallow inspiration. No focal consolidation, pleural effusion, pneumothorax. Cardiac silhouette is within. Atherosclerotic calcification of the aorta. Left pectoral pacemaker device. No acute osseous pathology. IMPRESSION: No active cardiopulmonary disease. Electronically Signed   By: Anner Crete M.D.   On: 01/14/2021 20:53    EKG: afib rate 122 nonspecific ST changes    ASSESSMENT AND PLAN:   PAF:  admission ECG with afib rate 122 now converted with AV pacing rates 60's continue cardizem 30 mg q6 change to LA in am Continue eliquis He has a history of PAF and Dr Olin Pia notes indicate ? Amiodarone toxicity and not using regular calcium blocker due to low BP  Dyspnea: ? Related to self limited rapid PAF. Stable CXR NAD, BNP low exam clear and sats fine echo pending but EF has been normal in past with no history of obstructive CAD and negative troponin  PPM:  Medtronic normal function    Signed: Jenkins Rouge 01/15/2021, 9:54 AM

## 2021-01-17 ENCOUNTER — Telehealth: Payer: Self-pay | Admitting: Internal Medicine

## 2021-01-17 LAB — CBG MONITORING, ED: Glucose-Capillary: 230 mg/dL — ABNORMAL HIGH (ref 70–99)

## 2021-01-17 NOTE — Telephone Encounter (Signed)
Patient calling for a ED visit wanting to know what type of appointment needed. Please advise

## 2021-01-17 NOTE — Telephone Encounter (Signed)
Chart reviewed. He was recently seen in the ER 7/8-79 for fatigue>> a-fib with HR> 120. He was placed on a diltiazem drip and then converted over to diltiazem 30 mg q6 hours. He was ultimately discharged on diltiazem 120 mg once daily.  The patient has a history of A-fib and his last visit was with Dr. Caryl Comes on 12/23/20. His last few office visits have been virtual due to his advanced dementia.  To Dr. Caryl Comes to advise on follow up.

## 2021-01-19 NOTE — Telephone Encounter (Signed)
MD out this week and no response back. OK to offer a follow up appointment with Dr. Caryl Comes on 02/01/21.  I am not sure his wife can get him to the office with his advanced dementia, it not, ok to schedule for a virtual visit.   I can reach out to the device clinic once confirmed to see if they can co-ordinate a transmission for the AM of 7/26.

## 2021-01-20 NOTE — Telephone Encounter (Signed)
Reviewed the patient's chart.  He is scheduled for a virtual visit with Dr. Caryl Comes on 02/01/21.

## 2021-01-26 DIAGNOSIS — F0281 Dementia in other diseases classified elsewhere with behavioral disturbance: Secondary | ICD-10-CM | POA: Diagnosis not present

## 2021-01-26 DIAGNOSIS — G301 Alzheimer's disease with late onset: Secondary | ICD-10-CM | POA: Diagnosis not present

## 2021-02-01 ENCOUNTER — Telehealth (INDEPENDENT_AMBULATORY_CARE_PROVIDER_SITE_OTHER): Payer: PPO | Admitting: Internal Medicine

## 2021-02-01 ENCOUNTER — Other Ambulatory Visit: Payer: Self-pay

## 2021-02-01 VITALS — HR 61

## 2021-02-01 DIAGNOSIS — I495 Sick sinus syndrome: Secondary | ICD-10-CM | POA: Diagnosis not present

## 2021-02-01 DIAGNOSIS — R001 Bradycardia, unspecified: Secondary | ICD-10-CM | POA: Diagnosis not present

## 2021-02-01 DIAGNOSIS — I480 Paroxysmal atrial fibrillation: Secondary | ICD-10-CM

## 2021-02-01 DIAGNOSIS — Z950 Presence of cardiac pacemaker: Secondary | ICD-10-CM | POA: Diagnosis not present

## 2021-02-01 DIAGNOSIS — I10 Essential (primary) hypertension: Secondary | ICD-10-CM | POA: Diagnosis not present

## 2021-02-01 NOTE — Patient Instructions (Signed)
Medication Instructions:  - Your physician recommends that you continue on your current medications as directed. Please refer to the Current Medication list given to you today.  *If you need a refill on your cardiac medications before your next appointment, please call your pharmacy*   Lab Work: - none ordered  If you have labs (blood work) drawn today and your tests are completely normal, you will receive your results only by: [place] (if you have MyChart) OR A paper copy in the mail If you have any lab test that is abnormal or we need to change your treatment, we will call you to review the results.   Testing/Procedures: - none ordered   Follow-Up: At [facility], you and your health needs are our priority.  As part of our continuing mission to provide you with exceptional heart care, we have created designated Provider Care Teams.  These Care Teams include your primary Cardiologist (physician) and Advanced Practice Providers (APPs -  Physician Assistants and Nurse Practitioners) who all work together to provide you with the care you need, when you need it.  We recommend signing up for the patient portal called "MyChart".  Sign up information is provided on this After Visit Summary.  MyChart is used to connect with patients for Virtual Visits (Telemedicine).  Patients are able to view lab/test results, encounter notes, upcoming appointments, etc.  Non-urgent messages can be sent to your provider as well.   To learn more about what you can do with MyChart, go to [email or web address].    Your next appointment:   June 2023  The format for your next appointment:   Virtual Visit   Provider:   Virl Axe, MD   Other Instructions N/a

## 2021-02-01 NOTE — Progress Notes (Signed)
Electrophysiology TeleHealth Note   Due to national recommendations of social distancing due to COVID 19, an audio/video telehealth visit is felt to be most appropriate for this patient at this time.  See MyChart message from today for the patient'Ernest consent to telehealth for [facility].   Date:  02/01/2021   ID:  Ernest [name], DOB 04-Feb-1936, MRN [id number]  Location: patient'Ernest home  Provider location: [address] [place]  Evaluation Performed: Follow-up visit  PCP:  Derinda Late, MD  Cardiologist:   BAE Electrophysiologist:  SK   Chief Complaint:  Afib and sinus node dysfunction   History of Present Illness:    Ernest [name] is a 85 y.o. male who presents via audio/video conferencing for a telehealth visit today.  Since last being seen in our clinic atrial fibrillation-paroxysmal with rapid ventricular response associated with sinus bradycardia  status post pacemaker 2/17 Medtronic anticoagulation with apixaban and significant deterioration in status secondary to dementia, with recent hospitalization for fatigue with AF RVR Tx with increased dilt , the patient'Ernest  wife reports difficulty in his taking meds   No clear dyspnea.  Dementia continues to worsen.  Palliative care is involved.  Huge struggle for Ernest [name] trying to take care of things.        DATE TEST EF    2015 Cath   70% Marginal  9/16 echo  55-60 mild LAE   1/21 Echo   NOT READ                Date TSH Cr K Hgb  9/17  3.98        12/17  4.12        12/18 5.016 0.8 4.3 14.3  7/19 4.462 0.8 4.5 14.5  6/20 1.96 0.9 4.2 14.7  1/21 0.355 0.67 4.1 13.5  9/21 2.78 (5/21) (CE) 0.7 4.2 14.9  3/22 0.9 (1/22) 0.87 4.2 14.1  7/22  0.913 0.72 3.9 13.3         Past Medical History:  Diagnosis Date   Aortic atherosclerosis (HCC)    Arthritis    BPH (benign prostatic hypertrophy)    Coronary artery disease    a. Cath 12/2013->[facility]->Med rx.   Deviated nasal septum     Diabetes mellitus without complication (HCC)    type 2   Essential hypertension    GERD (gastroesophageal reflux disease)    Hearing deficit    wears hearing aids   Hyperlipidemia    Lupus (HCC)    discoid of scalp only   PAF (paroxysmal atrial fibrillation) ([place])    a. CHA2DS2VASc = 7-->eliquis;  b. 03/2015 Echo: EF 55-60%, Gr1 DD, mild MR, mildly dil LA, PASP 49mHg.   Pneumonia    as a child   Presence of permanent cardiac pacemaker 08/16/2015   PVD (peripheral vascular disease) ([place]    Rosacea    Stroke ([place]    Thyroid disease    Urine frequency     Past Surgical History:  Procedure Laterality Date   [facility] 30% LM , pLAD, ostial CX. 20% mLAD, mRCA. 70% OM2 (small vessel). Med RX.    CARDIAC CATHETERIZATION     [facility]   CARD[facility]AC [facility]IZATION     [facility]   CATARACT EXTRACTION W/ INTRAOCULAR LENS IMPLANT     right   COLONOSCOPY     EP IMPLANTABLE DEVICE N/A 08/16/2015   Procedure: Pacemaker Implant;  Surgeon: SRevonda [name]  Ernest Comes, MD;  Location: [place] CV LAB;  Service: Cardiovascular;  Laterality: N/A;   LUMBAR LAMINECTOMY/DECOMPRESSION MICRODISCECTOMY N/A 01/04/2015   Procedure: LUMBAR LAMINECTOMY/DECOMPRESSION MICRODISCECTOMY 3 LEVELS;  Surgeon: Newman Pies, MD;  Location: [place] [facility];  Service: Neurosurgery;  Laterality: N/A;  L23 L34 L45 laminectomies and foraminotomies   MULTIPLE TOOTH EXTRACTIONS     PROSTATE BIOPSY      Current Outpatient Medications  Medication Sig Dispense Refill   apixaban (ELIQUIS) 5 MG TABS tablet Take 1 tablet (5 mg total) by mouth 2 (two) times daily. 180 tablet 1   atorvastatin (LIPITOR) 40 MG tablet Take 1 tablet (40 mg total) by mouth daily. 30 tablet 11   diltiazem (CARDIZEM CD) 120 MG 24 hr capsule Take 1 capsule (120 mg total) by mouth daily. Take as needed for HR greater than 140 bpm 30 capsule 3   glipiZIDE (GLUCOTROL XL) 5 MG 24 hr tablet Take 10 mg by mouth daily with breakfast.      levothyroxine (SYNTHROID) 125 MCG tablet Take 125 mcg by mouth daily before breakfast.      memantine (NAMENDA) 5 MG tablet Take 5 mg by mouth 2 (two) times daily.      metronidazole (NORITATE) 1 % cream Apply 1 application topically daily.     Multiple Vitamin (MULTIVITAMIN) tablet Take 1 tablet by mouth daily.     QUEtiapine (SEROQUEL) 50 MG tablet TAKE 1 TABLET(50 MG) BY MOUTH EVERY NIGHT     sertraline (ZOLOFT) 25 MG tablet Take 25 mg by mouth daily.      tamsulosin (FLOMAX) 0.4 MG CAPS capsule Take 0.4 mg by mouth 2 (two) times daily.     No current facility-administered medications for this visit.    Allergies:   Patient has no known allergies.   Social History:  The patient  reports that he has quit smoking. His smoking use included cigarettes. He has never used smokeless tobacco. He reports that he does not drink alcohol and does not use drugs.   Family History:  The patient'Ernest   family history includes Cancer in his brother; Cancer - Lung in his brother, brother, and sister; Diabetes in his sister; Heart attack in his father and mother.   ROS:  Please see the history of present illness.   All other systems are personally reviewed and negative.    Exam:    Vital Signs:  Pulse 61   SpO2 94%      Labs/Other Tests and Data Reviewed:    Recent Labs: 01/14/2021: ALT 22; B Natriuretic Peptide 87.6; Magnesium 1.9; TSH 0.913 01/15/2021: BUN 23; Creatinine, Ser 0.72; Hemoglobin 13.3; Platelets 162; Potassium 3.9; Sodium 140   Wt Readings from Last 3 Encounters:  01/14/21 166 lb 9.6 oz (75.6 kg)  12/23/20 158 lb (71.7 kg)  09/24/20 160 lb 9.6 oz (72.8 kg)     Other studies personally reviewed: Additional studies/ records that were reviewed today include: (As above)     Last device remote is reviewed from [facility] PDF dated 5/22 which reveals normal device function,   arrhythmias -intermittent atrial fibrillation with reasonably controlled rates and spontaneously  terminating   ASSESSMENT & PLAN:   Atrial fibrillation-paroxysmal   Coronary artery disease- nonobstructive   Pacemaker-Medtronic      Prior stroke   Sinus bradycardia   Gait instability    Dementia   Hypotension    No interval transmissions of data. Encouraged ongoing pursuit of assistance. No significant cardiovascular issues at present.  We  will see him again in 12 months he will call for questions        Labs/ tests ordered today include:   No orders of the defined types were placed in this encounter.     Patient Risk:  after full review of this patients clinical status, I feel that they are at moderate  risk at this time.  Today, I have spent 8 minutes with the patient with telehealth technology discussing the above.   Signed, Virl Axe, MD  02/01/2021 5:09 PM     [facility] [address] [place] [address] [phone] (office) [phone] (fax) Was is let you them are I did not assume that-HIV sometimes your) question before if if see replyPatient ID: Ernest [name], male   DOB: 03-Feb-1936, 85 y.o.   MRN: [id number]

## 2021-02-10 ENCOUNTER — Other Ambulatory Visit: Payer: Self-pay

## 2021-02-10 ENCOUNTER — Other Ambulatory Visit: Payer: PPO | Admitting: Nurse Practitioner

## 2021-02-11 ENCOUNTER — Encounter: Payer: Self-pay | Admitting: Internal Medicine

## 2021-02-11 ENCOUNTER — Other Ambulatory Visit: Payer: Self-pay

## 2021-02-11 ENCOUNTER — Observation Stay: Payer: PPO

## 2021-02-11 ENCOUNTER — Emergency Department: Payer: PPO

## 2021-02-11 ENCOUNTER — Inpatient Hospital Stay
Admission: EM | Admit: 2021-02-11 | Discharge: 2021-02-16 | DRG: 516 | Disposition: A | Discharge status: Hospice | Payer: PPO | Attending: Internal Medicine | Admitting: Internal Medicine

## 2021-02-11 DIAGNOSIS — R451 Restlessness and agitation: Secondary | ICD-10-CM | POA: Diagnosis present

## 2021-02-11 DIAGNOSIS — Z950 Presence of cardiac pacemaker: Secondary | ICD-10-CM | POA: Diagnosis not present

## 2021-02-11 DIAGNOSIS — I700 Atherosclerosis of aorta: Secondary | ICD-10-CM | POA: Diagnosis present

## 2021-02-11 DIAGNOSIS — S32000B Wedge compression fracture of unspecified lumbar vertebra, initial encounter for open fracture: Secondary | ICD-10-CM

## 2021-02-11 DIAGNOSIS — E039 Hypothyroidism, unspecified: Secondary | ICD-10-CM | POA: Diagnosis present

## 2021-02-11 DIAGNOSIS — L930 Discoid lupus erythematosus: Secondary | ICD-10-CM | POA: Diagnosis present

## 2021-02-11 DIAGNOSIS — N281 Cyst of kidney, acquired: Secondary | ICD-10-CM | POA: Diagnosis not present

## 2021-02-11 DIAGNOSIS — N400 Enlarged prostate without lower urinary tract symptoms: Secondary | ICD-10-CM | POA: Diagnosis present

## 2021-02-11 DIAGNOSIS — Z419 Encounter for procedure for purposes other than remedying health state, unspecified: Secondary | ICD-10-CM

## 2021-02-11 DIAGNOSIS — K219 Gastro-esophageal reflux disease without esophagitis: Secondary | ICD-10-CM | POA: Diagnosis present

## 2021-02-11 DIAGNOSIS — R404 Transient alteration of awareness: Secondary | ICD-10-CM | POA: Diagnosis not present

## 2021-02-11 DIAGNOSIS — H9190 Unspecified hearing loss, unspecified ear: Secondary | ICD-10-CM | POA: Diagnosis present

## 2021-02-11 DIAGNOSIS — M545 Low back pain, unspecified: Secondary | ICD-10-CM | POA: Diagnosis not present

## 2021-02-11 DIAGNOSIS — M47812 Spondylosis without myelopathy or radiculopathy, cervical region: Secondary | ICD-10-CM | POA: Diagnosis present

## 2021-02-11 DIAGNOSIS — S199XXA Unspecified injury of neck, initial encounter: Secondary | ICD-10-CM | POA: Diagnosis not present

## 2021-02-11 DIAGNOSIS — M48062 Spinal stenosis, lumbar region with neurogenic claudication: Secondary | ICD-10-CM | POA: Diagnosis not present

## 2021-02-11 DIAGNOSIS — E785 Hyperlipidemia, unspecified: Secondary | ICD-10-CM | POA: Diagnosis present

## 2021-02-11 DIAGNOSIS — F03918 Unspecified dementia, unspecified severity, with other behavioral disturbance: Secondary | ICD-10-CM | POA: Diagnosis present

## 2021-02-11 DIAGNOSIS — M549 Dorsalgia, unspecified: Secondary | ICD-10-CM | POA: Diagnosis not present

## 2021-02-11 DIAGNOSIS — W19XXXA Unspecified fall, initial encounter: Secondary | ICD-10-CM | POA: Diagnosis present

## 2021-02-11 DIAGNOSIS — R296 Repeated falls: Secondary | ICD-10-CM | POA: Diagnosis present

## 2021-02-11 DIAGNOSIS — S32010A Wedge compression fracture of first lumbar vertebra, initial encounter for closed fracture: Principal | ICD-10-CM | POA: Diagnosis present

## 2021-02-11 DIAGNOSIS — I2510 Atherosclerotic heart disease of native coronary artery without angina pectoris: Secondary | ICD-10-CM | POA: Diagnosis present

## 2021-02-11 DIAGNOSIS — Z87891 Personal history of nicotine dependence: Secondary | ICD-10-CM

## 2021-02-11 DIAGNOSIS — Z7984 Long term (current) use of oral hypoglycemic drugs: Secondary | ICD-10-CM

## 2021-02-11 DIAGNOSIS — W19XXXD Unspecified fall, subsequent encounter: Secondary | ICD-10-CM

## 2021-02-11 DIAGNOSIS — Z7401 Bed confinement status: Secondary | ICD-10-CM | POA: Diagnosis not present

## 2021-02-11 DIAGNOSIS — F0281 Dementia in other diseases classified elsewhere with behavioral disturbance: Secondary | ICD-10-CM

## 2021-02-11 DIAGNOSIS — Z974 Presence of external hearing-aid: Secondary | ICD-10-CM | POA: Diagnosis not present

## 2021-02-11 DIAGNOSIS — R4182 Altered mental status, unspecified: Secondary | ICD-10-CM | POA: Diagnosis not present

## 2021-02-11 DIAGNOSIS — R52 Pain, unspecified: Secondary | ICD-10-CM | POA: Diagnosis not present

## 2021-02-11 DIAGNOSIS — S92425A Nondisplaced fracture of distal phalanx of left great toe, initial encounter for closed fracture: Secondary | ICD-10-CM | POA: Diagnosis not present

## 2021-02-11 DIAGNOSIS — J9811 Atelectasis: Secondary | ICD-10-CM | POA: Diagnosis not present

## 2021-02-11 DIAGNOSIS — Z9889 Other specified postprocedural states: Secondary | ICD-10-CM | POA: Diagnosis not present

## 2021-02-11 DIAGNOSIS — I10 Essential (primary) hypertension: Secondary | ICD-10-CM | POA: Diagnosis present

## 2021-02-11 DIAGNOSIS — Z7189 Other specified counseling: Secondary | ICD-10-CM | POA: Diagnosis not present

## 2021-02-11 DIAGNOSIS — Z8673 Personal history of transient ischemic attack (TIA), and cerebral infarction without residual deficits: Secondary | ICD-10-CM

## 2021-02-11 DIAGNOSIS — Z833 Family history of diabetes mellitus: Secondary | ICD-10-CM | POA: Diagnosis not present

## 2021-02-11 DIAGNOSIS — E1151 Type 2 diabetes mellitus with diabetic peripheral angiopathy without gangrene: Secondary | ICD-10-CM | POA: Diagnosis present

## 2021-02-11 DIAGNOSIS — F0391 Unspecified dementia with behavioral disturbance: Secondary | ICD-10-CM | POA: Diagnosis present

## 2021-02-11 DIAGNOSIS — Z961 Presence of intraocular lens: Secondary | ICD-10-CM | POA: Diagnosis present

## 2021-02-11 DIAGNOSIS — I480 Paroxysmal atrial fibrillation: Secondary | ICD-10-CM | POA: Diagnosis present

## 2021-02-11 DIAGNOSIS — G309 Alzheimer's disease, unspecified: Secondary | ICD-10-CM | POA: Diagnosis not present

## 2021-02-11 DIAGNOSIS — Z20822 Contact with and (suspected) exposure to covid-19: Secondary | ICD-10-CM | POA: Diagnosis present

## 2021-02-11 DIAGNOSIS — S32000A Wedge compression fracture of unspecified lumbar vertebra, initial encounter for closed fracture: Secondary | ICD-10-CM | POA: Diagnosis present

## 2021-02-11 DIAGNOSIS — Z7901 Long term (current) use of anticoagulants: Secondary | ICD-10-CM

## 2021-02-11 DIAGNOSIS — Z79899 Other long term (current) drug therapy: Secondary | ICD-10-CM

## 2021-02-11 DIAGNOSIS — S32010K Wedge compression fracture of first lumbar vertebra, subsequent encounter for fracture with nonunion: Secondary | ICD-10-CM | POA: Diagnosis not present

## 2021-02-11 DIAGNOSIS — Z66 Do not resuscitate: Secondary | ICD-10-CM | POA: Diagnosis present

## 2021-02-11 DIAGNOSIS — S0003XA Contusion of scalp, initial encounter: Secondary | ICD-10-CM | POA: Diagnosis not present

## 2021-02-11 DIAGNOSIS — Z79890 Hormone replacement therapy (postmenopausal): Secondary | ICD-10-CM

## 2021-02-11 DIAGNOSIS — Z9841 Cataract extraction status, right eye: Secondary | ICD-10-CM

## 2021-02-11 DIAGNOSIS — R0902 Hypoxemia: Secondary | ICD-10-CM | POA: Diagnosis not present

## 2021-02-11 DIAGNOSIS — E119 Type 2 diabetes mellitus without complications: Secondary | ICD-10-CM

## 2021-02-11 DIAGNOSIS — Z8249 Family history of ischemic heart disease and other diseases of the circulatory system: Secondary | ICD-10-CM

## 2021-02-11 DIAGNOSIS — S3991XA Unspecified injury of abdomen, initial encounter: Secondary | ICD-10-CM | POA: Diagnosis not present

## 2021-02-11 LAB — CBC WITH DIFFERENTIAL/PLATELET
Abs Immature Granulocytes: 0.03 10*3/uL (ref 0.00–0.07)
Basophils Absolute: 0.1 10*3/uL (ref 0.0–0.1)
Basophils Relative: 1 %
Eosinophils Absolute: 0.2 10*3/uL (ref 0.0–0.5)
Eosinophils Relative: 2 %
HCT: 40.6 % (ref 39.0–52.0)
Hemoglobin: 14.5 g/dL (ref 13.0–17.0)
Immature Granulocytes: 0 %
Lymphocytes Relative: 21 %
Lymphs Abs: 2.1 10*3/uL (ref 0.7–4.0)
MCH: 30.3 pg (ref 26.0–34.0)
MCHC: 35.7 g/dL (ref 30.0–36.0)
MCV: 84.8 fL (ref 80.0–100.0)
Monocytes Absolute: 0.9 10*3/uL (ref 0.1–1.0)
Monocytes Relative: 9 %
Neutro Abs: 6.8 10*3/uL (ref 1.7–7.7)
Neutrophils Relative %: 67 %
Platelets: 189 10*3/uL (ref 150–400)
RBC: 4.79 MIL/uL (ref 4.22–5.81)
RDW: 12.5 % (ref 11.5–15.5)
WBC: 10.1 10*3/uL (ref 4.0–10.5)
nRBC: 0 % (ref 0.0–0.2)

## 2021-02-11 LAB — COMPREHENSIVE METABOLIC PANEL
ALT: 16 U/L (ref 0–44)
AST: 21 U/L (ref 15–41)
Albumin: 3.8 g/dL (ref 3.5–5.0)
Alkaline Phosphatase: 94 U/L (ref 38–126)
Anion gap: 8 (ref 5–15)
BUN: 20 mg/dL (ref 8–23)
CO2: 25 mmol/L (ref 22–32)
Calcium: 10.2 mg/dL (ref 8.9–10.3)
Chloride: 103 mmol/L (ref 98–111)
Creatinine, Ser: 0.67 mg/dL (ref 0.61–1.24)
GFR, Estimated: >60 mL/min (ref >60)
Glucose, Bld: 212 mg/dL — ABNORMAL HIGH (ref 70–99)
Potassium: 4.4 mmol/L (ref 3.5–5.1)
Sodium: 136 mmol/L (ref 135–145)
Total Bilirubin: 1.1 mg/dL (ref 0.3–1.2)
Total Protein: 7 g/dL (ref 6.5–8.1)

## 2021-02-11 LAB — URINALYSIS, COMPLETE (UACMP) WITH MICROSCOPIC
Bacteria, UA: NONE SEEN
Bilirubin Urine: NEGATIVE
Glucose, UA: >=500 mg/dL — AB
Hgb urine dipstick: NEGATIVE
Ketones, ur: 20 mg/dL — AB
Leukocytes,Ua: NEGATIVE
Nitrite: NEGATIVE
Protein, ur: NEGATIVE mg/dL
Specific Gravity, Urine: >1.046 — ABNORMAL HIGH (ref 1.005–1.030)
Squamous Epithelial / HPF: NONE SEEN (ref 0–5)
pH: 6 (ref 5.0–8.0)

## 2021-02-11 LAB — APTT: aPTT: 32 seconds (ref 24–36)

## 2021-02-11 LAB — PROTIME-INR
INR: 1 (ref 0.8–1.2)
Prothrombin Time: 13.3 seconds (ref 11.4–15.2)

## 2021-02-11 LAB — LACTIC ACID, PLASMA
Lactic Acid, Venous: 1.2 mmol/L (ref 0.5–1.9)
Lactic Acid, Venous: 1.1 mmol/L (ref 0.5–1.9)

## 2021-02-11 LAB — CK: Total CK: 17 U/L — ABNORMAL LOW (ref 49–397)

## 2021-02-11 MED ORDER — ONDANSETRON HCL 4 MG/2ML IJ SOLN: 4.0000 mg | Freq: Four times a day (QID) | INTRAMUSCULAR | Status: DC | PRN

## 2021-02-11 MED ORDER — SERTRALINE HCL 50 MG PO TABS
25.0000 mg | ORAL_TABLET | Freq: Every day | ORAL | Status: DC
  Administered 2021-02-11 – 2021-02-16 (×5): 25 mg via ORAL
  Filled 2021-02-11 (×5): qty 1

## 2021-02-11 MED ORDER — ACETAMINOPHEN 325 MG PO TABS
650.0000 mg | ORAL_TABLET | Freq: Four times a day (QID) | ORAL | Status: DC | PRN
  Filled 2021-02-11: qty 2

## 2021-02-11 MED ORDER — SODIUM CHLORIDE 0.9 % IV SOLN: 250.0000 mL | INTRAVENOUS | Status: DC | PRN

## 2021-02-11 MED ORDER — SODIUM CHLORIDE 0.9% FLUSH: 3.0000 mL | INTRAVENOUS | Status: DC | PRN

## 2021-02-11 MED ORDER — HALOPERIDOL LACTATE 5 MG/ML IJ SOLN
5.0000 mg | Freq: Once | INTRAMUSCULAR | Status: AC
  Administered 2021-02-11: 5 mg via INTRAMUSCULAR
  Filled 2021-02-11: qty 1

## 2021-02-11 MED ORDER — SODIUM CHLORIDE 0.9% FLUSH
3.0000 mL | Freq: Two times a day (BID) | INTRAVENOUS | Status: DC
  Administered 2021-02-11 – 2021-02-16 (×8): 3 mL via INTRAVENOUS

## 2021-02-11 MED ORDER — LIDOCAINE 5 % EX PTCH
1.0000 | MEDICATED_PATCH | CUTANEOUS | Status: DC
  Administered 2021-02-11 – 2021-02-16 (×3): 1 via TRANSDERMAL
  Filled 2021-02-11 (×6): qty 1

## 2021-02-11 MED ORDER — DILTIAZEM HCL ER COATED BEADS 120 MG PO CP24
120.0000 mg | ORAL_CAPSULE | Freq: Every day | ORAL | Status: DC
Start: 2021-02-12 — End: 2021-02-17
  Administered 2021-02-12 – 2021-02-16 (×4): 120 mg via ORAL
  Filled 2021-02-11 (×5): qty 1

## 2021-02-11 MED ORDER — IOHEXOL 350 MG/ML SOLN
75.0000 mL | Freq: Once | INTRAVENOUS | Status: AC | PRN
  Administered 2021-02-11: 75 mL via INTRAVENOUS

## 2021-02-11 MED ORDER — OXYCODONE HCL 5 MG PO TABS
5.0000 mg | ORAL_TABLET | Freq: Four times a day (QID) | ORAL | Status: DC | PRN
  Administered 2021-02-12: 5 mg via ORAL
  Filled 2021-02-11: qty 1

## 2021-02-11 MED ORDER — ADULT MULTIVITAMIN W/MINERALS CH
1.0000 | ORAL_TABLET | Freq: Every day | ORAL | Status: DC
  Administered 2021-02-11 – 2021-02-16 (×5): 1 via ORAL
  Filled 2021-02-11 (×5): qty 1

## 2021-02-11 MED ORDER — FENTANYL CITRATE (PF) 100 MCG/2ML IJ SOLN
50.0000 ug | INTRAMUSCULAR | Status: DC | PRN
Start: 2021-02-11 — End: 2021-02-12
  Administered 2021-02-11: 50 ug via INTRAVENOUS
  Filled 2021-02-11: qty 2

## 2021-02-11 MED ORDER — ACETAMINOPHEN 500 MG PO TABS
1000.0000 mg | ORAL_TABLET | Freq: Three times a day (TID) | ORAL | Status: DC
  Administered 2021-02-11 – 2021-02-16 (×9): 1000 mg via ORAL
  Filled 2021-02-11 (×12): qty 2

## 2021-02-11 MED ORDER — ACETAMINOPHEN 650 MG RE SUPP: 650.0000 mg | Freq: Four times a day (QID) | RECTAL | Status: DC | PRN

## 2021-02-11 MED ORDER — HALOPERIDOL LACTATE 5 MG/ML IJ SOLN
1.0000 mg | Freq: Four times a day (QID) | INTRAMUSCULAR | Status: DC | PRN
  Administered 2021-02-11 – 2021-02-14 (×3): 1 mg via INTRAVENOUS
  Filled 2021-02-11 (×3): qty 1

## 2021-02-11 MED ORDER — QUETIAPINE FUMARATE 25 MG PO TABS
50.0000 mg | ORAL_TABLET | Freq: Every day | ORAL | Status: DC
  Administered 2021-02-11 – 2021-02-15 (×5): 50 mg via ORAL
  Filled 2021-02-11 (×5): qty 2

## 2021-02-11 MED ORDER — MIDAZOLAM HCL 5 MG/5ML IJ SOLN
1.0000 mg | Freq: Once | INTRAMUSCULAR | Status: AC
  Administered 2021-02-11: 1 mg via INTRAVENOUS
  Filled 2021-02-11: qty 5

## 2021-02-11 MED ORDER — LEVOTHYROXINE SODIUM 50 MCG PO TABS
125.0000 ug | ORAL_TABLET | Freq: Every day | ORAL | Status: DC
  Administered 2021-02-12 – 2021-02-16 (×4): 125 ug via ORAL
  Filled 2021-02-11 (×5): qty 1

## 2021-02-11 MED ORDER — ATORVASTATIN CALCIUM 20 MG PO TABS
40.0000 mg | ORAL_TABLET | Freq: Every day | ORAL | Status: DC
  Administered 2021-02-11 – 2021-02-16 (×5): 40 mg via ORAL
  Filled 2021-02-11 (×5): qty 2

## 2021-02-11 MED ORDER — MEMANTINE HCL 5 MG PO TABS
5.0000 mg | ORAL_TABLET | Freq: Two times a day (BID) | ORAL | Status: DC
  Administered 2021-02-11 – 2021-02-16 (×9): 5 mg via ORAL
  Filled 2021-02-11 (×10): qty 1

## 2021-02-11 MED ORDER — ONDANSETRON HCL 4 MG PO TABS: 4.0000 mg | ORAL_TABLET | Freq: Four times a day (QID) | ORAL | Status: DC | PRN

## 2021-02-11 MED ORDER — TAMSULOSIN HCL 0.4 MG PO CAPS
0.4000 mg | ORAL_CAPSULE | Freq: Two times a day (BID) | ORAL | Status: DC
  Administered 2021-02-11 – 2021-02-16 (×10): 0.4 mg via ORAL
  Filled 2021-02-11 (×10): qty 1

## 2021-02-11 NOTE — Consult Note (Signed)
NEUROSURGERY CONSULT NOTE  Referring Physician:  No referring provider defined for this encounter.  Primary Physician:  Derinda Late, MD  Chief Complaint:  Back pain after a fall. CT shows L1 compression fracture.   History of Present Illness: 02/11/2021 Ernest [name] is a 85 y.o. male past medical history of A. fib on Eliquis, dementia, BPH, hypothyroidism, type 2 diabetes, and hypertension who presents to the ER via EMS with the chief complaint of back pain in the setting of increased falls. The patient is unable to provide the majority of his history due to his underlying dementia. His wife is at the bedside and states that the patient has had increased issues with falling over the last week and since yesterday has had some decline in his mobility and increased confusion.  She states that he has complained of pain and had grimacing.  She found him crawling on the floor yesterday.  Patient's wife states that at baseline he is ambulatory but is unable to perform his own ADLs and IDLs.   Review of Systems:  A 10 point review of systems is negative, except for the pertinent positives and negatives detailed in the HPI.  Past Medical History: Past Medical History:  Diagnosis Date   Aortic atherosclerosis (HCC)    Arthritis    BPH (benign prostatic hypertrophy)    Coronary artery disease    a. Cath 12/2013->[facility]->Med rx.   Deviated nasal septum    Diabetes mellitus without complication (HCC)    type 2   Essential hypertension    GERD (gastroesophageal reflux disease)    Hearing deficit    wears hearing aids   Hyperlipidemia    Lupus (HCC)    discoid of scalp only   PAF (paroxysmal atrial fibrillation) ([place])    a. CHA2DS2VASc = 7-->eliquis;  b. 03/2015 Echo: EF 55-60%, Gr1 DD, mild MR, mildly dil LA, PASP 28mHg.   Pneumonia    as a child   Presence of permanent cardiac pacemaker 08/16/2015   PVD (peripheral vascular disease) ([place]    Rosacea    Stroke ([facility]    Thyroid  disease    Urine frequency     Past Surgical History: Past Surgical History:  Procedure Laterality Date   [facility] , pLAD, ostial CX. 20% mLAD, mRCA. 70% OM2 (small vessel). Med RX.    CARD[facility]AC [facility]     [facility]   [facility]   CATARACT EXTRACTION W/ INTRAOCULAR LENS IMPLANT     right   COLONOSCOPY     EP IMPLANTABLE DEVICE N/A 08/16/2015   Procedure: Pacemaker Implant;  Surgeon: SDeboraha Sprang MD;  [facility] [place];  Service: Cardiovascular;  Laterality: N/A;   LUMBAR LAMINECTOMY/DECOMPRESSION MICRODISCECTOMY N/A 01/04/2015   Procedure: LUMBAR LAMINECTOMY/DECOMPRESSION MICRODISCECTOMY 3 LEVELS;  Surgeon: JNewman Pies MD;  [place] [place];  Service: Neurosurgery;  Laterality: N/A;  L23 L34 L45 laminectomies and foraminotomies   MULTIPLE TOOTH EXTRACTIONS     PROSTATE BIOPSY      Allergies: Allergies as of 02/11/2021   (No Known Allergies)    Medications:  Current Facility-Administered Medications:    0.9 %  sodium chloride infusion, 250 mL, Intravenous, PRN, Agbata, Tochukwu, MD   acetaminophen (TYLENOL) tablet 650 mg, 650 mg, Oral, Q6H PRN **OR** acetaminophen (TYLENOL) suppository 650 mg, 650 mg, Rectal, Q6H PRN, Agbata, Tochukwu, MD   acetaminophen (TYLENOL) tablet 1,000 mg, 1,000 mg, Oral, TID,  Collier Bullock, MD   atorvastatin (LIPITOR) tablet 40 mg, 40 mg, Oral, Daily, Agbata, Tochukwu, MD   [START ON 02/12/2021] diltiazem (CARDIZEM CD) 24 hr capsule 120 mg, 120 mg, Oral, Daily, Agbata, Tochukwu, MD   fentaNYL (SUBLIMAZE) injection 50 mcg, 50 mcg, Intravenous, Q1H PRN, Merlyn Lot, MD, 50 mcg at 02/11/21 1258   haloperidol lactate (HALDOL) injection 1 mg, 1 mg, Intravenous, Q6H PRN, Agbata, Tochukwu, MD   [START ON 02/12/2021] levothyroxine (SYNTHROID) tablet 125 mcg, 125 mcg, Oral, QAC breakfast, Agbata, Tochukwu, MD   lidocaine (LIDODERM) 5 % 1 patch, 1 patch, Transdermal, Q24H,  Agbata, Tochukwu, MD   memantine (NAMENDA) tablet 5 mg, 5 mg, Oral, BID, Agbata, Tochukwu, MD   multivitamin with minerals tablet 1 tablet, 1 tablet, Oral, Daily, Agbata, Tochukwu, MD   ondansetron (ZOFRAN) tablet 4 mg, 4 mg, Oral, Q6H PRN **OR** ondansetron (ZOFRAN) injection 4 mg, 4 mg, Intravenous, Q6H PRN, Agbata, Tochukwu, MD   oxyCODONE (Oxy IR/ROXICODONE) immediate release tablet 5 mg, 5 mg, Oral, Q6H PRN, Agbata, Tochukwu, MD   QUEtiapine (SEROQUEL) tablet 50 mg, 50 mg, Oral, QHS, Agbata, Tochukwu, MD   sertraline (ZOLOFT) tablet 25 mg, 25 mg, Oral, Daily, Agbata, Tochukwu, MD   sodium chloride flush (NS) 0.9 % injection 3 mL, 3 mL, Intravenous, Q12H, Agbata, Tochukwu, MD   sodium chloride flush (NS) 0.9 % injection 3 mL, 3 mL, Intravenous, PRN, Agbata, Tochukwu, MD   tamsulosin (FLOMAX) capsule 0.4 mg, 0.4 mg, Oral, BID, Agbata, Tochukwu, MD  Current Outpatient Medications:    apixaban (ELIQUIS) 5 MG TABS tablet, Take 1 tablet (5 mg total) by mouth 2 (two) times daily., Disp: 180 tablet, Rfl: 1   atorvastatin (LIPITOR) 40 MG tablet, Take 1 tablet (40 mg total) by mouth daily., Disp: 30 tablet, Rfl: 11   diltiazem (CARDIZEM CD) 120 MG 24 hr capsule, Take 1 capsule (120 mg total) by mouth daily. Take as needed for HR greater than 140 bpm, Disp: 30 capsule, Rfl: 3   glipiZIDE (GLUCOTROL XL) 5 MG 24 hr tablet, Take 10 mg by mouth daily with breakfast., Disp: , Rfl:    levothyroxine (SYNTHROID) 125 MCG tablet, Take 125 mcg by mouth daily before breakfast. , Disp: , Rfl:    memantine (NAMENDA) 5 MG tablet, Take 5 mg by mouth 2 (two) times daily. , Disp: , Rfl:    metronidazole (NORITATE) 1 % cream, Apply 1 application topically daily as needed., Disp: , Rfl:    Multiple Vitamin (MULTIVITAMIN) tablet, Take 1 tablet by mouth daily., Disp: , Rfl:    QUEtiapine (SEROQUEL) 50 MG tablet, Take 50 mg by mouth at bedtime., Disp: , Rfl:    sertraline (ZOLOFT) 25 MG tablet, Take 25 mg by mouth daily. ,  Disp: , Rfl:    tamsulosin (FLOMAX) 0.4 MG CAPS capsule, Take 0.4 mg by mouth 2 (two) times daily., Disp: , Rfl:    Social History: Social History   Tobacco Use   Smoking status: Former    Types: Cigarettes   Smokeless tobacco: Never   Tobacco comments:    " Quit smoking cigarettes in 1983 "  Vaping Use   Vaping Use: Former  Substance Use Topics   Alcohol use: No   Drug use: No    Family Medical History: Family History  Problem Relation Age of Onset   Heart attack Mother    Heart attack Father    Cancer - Lung Sister    Cancer - Lung Brother  Cancer - Lung Brother    Cancer Brother    Diabetes Sister     Physical Examination: Vitals:   02/11/21 1135 02/11/21 1320  BP: 140/77 (!) 141/69  Pulse: 79 88  Resp: 17 17  Temp:    SpO2: 99% 94%     General: Patient is well developed, well nourished, calm, collected.  Psychiatric: Patient is non-anxious.  Head:  Pupils equal, round, and reactive to light.  ENT:  Oral mucosa appears well hydrated.  Neck:   Supple.  Full range of motion.  Respiratory: Patient is breathing without any difficulty.  Extremities: No edema.  Vascular: Palpable pulses in dorsal pedal vessels.  Skin:   On exposed skin, there are no abnormal skin lesions.  NEUROLOGICAL:  General: In no acute distress.   Awake, alert, oriented to person only. Pupils equal round and reactive to light.  Palpation of spine: Lumbar sacral spine is diffuse throughout without any obvious deformity or step-off.  Strength: Pt is unable to participate in strength exam given his cognitive status however he does follow basic commands such as wiggling his toes.  Moves all extremities equally to pain. Able to assess gait  Imaging: IMPRESSION: 1. Acute or subacute L1 compression fracture with mild height loss. 2. No evidence of intra-abdominal injury. 3. Chronic findings are described above. Aortic Atherosclerosis (ICD10-I70.0).   Electronically Signed    By: Monte Fantasia M.D.   On: 02/11/2021 11:57    I have personally reviewed the images and agree with the above interpretation.  Labs: CBC Latest Ref Rng & Units 02/11/2021 01/15/2021 01/14/2021  WBC 4.0 - 10.5 K/uL 10.1 9.5 7.5  Hemoglobin 13.0 - 17.0 g/dL 14.5 13.3 13.6  Hematocrit 39.0 - 52.0 % 40.6 38.6(L) 38.0(L)  Platelets 150 - 400 K/uL 189 162 141(L)    Assessment and Plan: Mr. Gawron is a pleasant 85 y.o. male with significant dementia at baseline who presents after falls.  CT scanning of the abdominal spine shows L1 compression fracture.  - admit to medicine  - pain control  - TLSO brace when upright or OOB - Hold Eliquis  - NPO at midnight Monday (pending orthopedics plan) - Will follow the patient peripherally - Will call or reconsult with any questions or concerns.   I have discussed the condition with the patient's wife.    Cooper Render  Dept. of Neurosurgery

## 2021-02-11 NOTE — Progress Notes (Signed)
Met wife in hallway escorted her bedside to her husband. She was appreciative of the visit no addition needs at the time.

## 2021-02-11 NOTE — ED Notes (Signed)
Pt yelling oput when changing pts brief, pt trying to hit ed rn

## 2021-02-11 NOTE — ED Provider Notes (Signed)
[facility] Emergency Department Provider Note    Event Date/Time   First MD Initiated Contact with Patient 02/11/21 [phone]     (approximate)  I have reviewed the triage vital signs and the nursing notes.   HISTORY  Chief Complaint Altered Mental Status (Pt has had multiple falls over the past few days most recently 8/4 when he was down for a couple of hours. Today pt was weak and c/o lower back pain. Pt's daughter reports hx of dementia and states has been more altered lately. Pt confused on arrival and unable to answer questions.)  Level V caveat:  AMS - dementia  HPI Ernest [name] is a 85 y.o. male below listed past medical history presents to the ER for evaluation of altered mental status frequent falls over the past 24 hours.  Was found down for unknown amount of time last night.  Patient unable to provide much additional history due to dementia.  Past Medical History:  Diagnosis Date   Aortic atherosclerosis (HCC)    Arthritis    BPH (benign prostatic hypertrophy)    Coronary artery disease    a. Cath 12/2013->[facility]->Med rx.   Deviated nasal septum    Diabetes mellitus without complication (HCC)    type 2   Essential hypertension    GERD (gastroesophageal reflux disease)    Hearing deficit    wears hearing aids   Hyperlipidemia    Lupus (HCC)    discoid of scalp only   PAF (paroxysmal atrial fibrillation) ([place])    a. CHA2DS2VASc = 7-->eliquis;  b. 03/2015 Echo: EF 55-60%, Gr1 DD, mild MR, mildly dil LA, PASP 26mHg.   Pneumonia    as a child   Presence of permanent cardiac pacemaker 08/16/2015   PVD (peripheral vascular disease) ([place]    Rosacea    Stroke ([place]    Thyroid disease    Urine frequency    Family History  Problem Relation Age of Onset   Heart attack Mother    Heart attack Father    Cancer - Lung Sister    Cancer - Lung Brother    Cancer - Lung Brother    Cancer Brother    Diabetes Sister    Past Surgical History:   Procedure Laterality Date   CARDIAC CATHETERIZATION     [facility] 12/24/13: 30% LM , pLAD, ostial CX. 20% mLAD, mRCA. 70% OM2 (small vessel). Med RX.    CARDIAC CATHETERIZATION     [facility]   [facility]     [facility]   CATARACT EXTRACTION W/ INTRAOCULAR LENS IMPLANT     right   COLONOSCOPY     EP IMPLANTABLE DEVICE N/A 08/16/2015   Procedure: Pacemaker Implant;  Surgeon: SDeboraha Sprang MD;  [facility];  Service: Cardiovascular;  Laterality: N/A;   LUMBAR LAMINECTOMY/DECOMPRESSION MICRODISCECTOMY N/A 01/04/2015   Procedure: LUMBAR LAMINECTOMY/DECOMPRESSION MICRODISCECTOMY 3 LEVELS;  Surgeon: JNewman Pies MD;  [place];  Service: Neurosurgery;  Laterality: N/A;  L23 L34 L45 laminectomies and foraminotomies   MULTIPLE TOOTH EXTRACTIONS     PROSTATE BIOPSY     Patient Active Problem List   Diagnosis Date Noted   Dementia with behavioral disturbance ([place] 08/09/2019   Sinus node dysfunction ([place] 08/16/2015   Bradycardia 08/16/2015   PAF (paroxysmal atrial fibrillation) ([place]    Coronary artery disease    Essential hypertension    Hypertension 03/11/2015   Type 2 diabetes mellitus ([place] 03/11/2015   GERD (gastroesophageal  reflux disease) 03/11/2015   Hyperlipidemia 03/11/2015   BPH (benign prostatic hyperplasia) 03/11/2015   Atrial fibrillation with rapid ventricular response ([place]) 03/11/2015   Atrial fibrillation with RVR ([place]) 03/10/2015   Lumbar stenosis with neurogenic claudication 01/04/2015      Prior to Admission medications   Medication Sig Start Date End Date Taking? Authorizing Provider  apixaban (ELIQUIS) 5 MG TABS tablet Take 1 tablet (5 mg total) by mouth 2 (two) times daily. 12/13/20   Deboraha Sprang, MD  atorvastatin (LIPITOR) 40 MG tablet Take 1 tablet (40 mg total) by mouth daily. 01/15/21 01/15/22  George Hugh, MD  diltiazem (CARDIZEM CD) 120 MG 24 hr capsule Take 1 capsule (120 mg total) by mouth daily. Take as needed for HR  greater than 140 bpm 01/15/21 02/14/21  George Hugh, MD  glipiZIDE (GLUCOTROL XL) 5 MG 24 hr tablet Take 10 mg by mouth daily with breakfast. 01/13/19 12/05/21  [provider]  levothyroxine (SYNTHROID) 125 MCG tablet Take 125 mcg by mouth daily before breakfast.     [provider]  memantine (NAMENDA) 5 MG tablet Take 5 mg by mouth 2 (two) times daily.  02/19/18   [provider]  metronidazole (NORITATE) 1 % cream Apply 1 application topically daily.    [provider]  Multiple Vitamin (MULTIVITAMIN) tablet Take 1 tablet by mouth daily.    [provider]  QUEtiapine (SEROQUEL) 50 MG tablet TAKE 1 TABLET(50 MG) BY MOUTH EVERY NIGHT 07/23/20   [provider]  sertraline (ZOLOFT) 25 MG tablet Take 25 mg by mouth daily.  12/12/18   [provider]  tamsulosin (FLOMAX) 0.4 MG CAPS capsule Take 0.4 mg by mouth 2 (two) times daily.    [provider]  lovastatin (MEVACOR) 40 MG tablet Take 40 mg by mouth at bedtime.   01/15/21  [provider]    Allergies Patient has no known allergies.    Social History Social History   Tobacco Use   Smoking status: Former    Types: Cigarettes   Smokeless tobacco: Never   Tobacco comments:    " Quit smoking cigarettes in 1983 "  Vaping Use   Vaping Use: Former  Substance Use Topics   Alcohol use: No   Drug use: No    Review of Systems Patient denies headaches, rhinorrhea, blurry vision, numbness, shortness of breath, chest pain, edema, cough, abdominal pain, nausea, vomiting, diarrhea, dysuria, fevers, rashes or hallucinations unless otherwise stated above in HPI. ____________________________________________   PHYSICAL EXAM:  VITAL SIGNS: Vitals:   02/11/21 1033 02/11/21 1135  BP: (!) 143/112 140/77  Pulse: 95 79  Resp: 17 17  Temp:    SpO2: 96% 99%    Constitutional: Alert, disoriented  Eyes: Conjunctivae are normal.  Head: Atraumatic. Nose: No  congestion/rhinnorhea. Mouth/Throat: Mucous membranes are moist.   Neck: No stridor. Painless ROM.  Cardiovascular: Normal rate, regular rhythm. Grossly normal heart sounds.  Good peripheral circulation. Respiratory: Normal respiratory effort.  No retractions. Lungs CTAB. Gastrointestinal: Soft and nontender. No distention. No abdominal bruits. No CVA tenderness. Genitourinary: deferred Musculoskeletal: pain with palpation of low back, no step offs or deformity.  No lower extremity tenderness nor edema.  No joint effusions. Neurologic:  Normal speech and language. No gross focal neurologic deficits are appreciated. No facial droop Skin:  Skin is warm, dry and intact. No rash noted. Psychiatric: currently calm and cooperative  ____________________________________________   LABS (all labs ordered are listed, but only abnormal  results are displayed)  Results for orders placed or performed during the hospital encounter of 02/11/21 (from the past 24 hour(s))  Comprehensive metabolic panel     Status: Abnormal   Collection Time: 02/11/21  8:26 AM  Result Value Ref Range   Sodium 136 135 - 145 mmol/L   Potassium 4.4 3.5 - 5.1 mmol/L   Chloride 103 98 - 111 mmol/L   CO2 25 22 - 32 mmol/L   Glucose, Bld 212 (H) 70 - 99 mg/dL   BUN 20 8 - 23 mg/dL   Creatinine, Ser 0.67 0.61 - 1.24 mg/dL   Calcium 10.2 8.9 - 10.3 mg/dL   Total Protein 7.0 6.5 - 8.1 g/dL   Albumin 3.8 3.5 - 5.0 g/dL   AST 21 15 - 41 U/L   ALT 16 0 - 44 U/L   Alkaline Phosphatase 94 38 - 126 U/L   Total Bilirubin 1.1 0.3 - 1.2 mg/dL   GFR, Estimated >60 >60 mL/min   Anion gap 8 5 - 15  CBC WITH DIFFERENTIAL     Status: None   Collection Time: 02/11/21  8:26 AM  Result Value Ref Range   WBC 10.1 4.0 - 10.5 K/uL   RBC 4.79 4.22 - 5.81 MIL/uL   Hemoglobin 14.5 13.0 - 17.0 g/dL   HCT 40.6 39.0 - 52.0 %   MCV 84.8 80.0 - 100.0 fL   MCH 30.3 26.0 - 34.0 pg   MCHC 35.7 30.0 - 36.0 g/dL   RDW 12.5 11.5 - 15.5 %    Platelets 189 150 - 400 K/uL   nRBC 0.0 0.0 - 0.2 %   Neutrophils Relative % 67 %   Neutro Abs 6.8 1.7 - 7.7 K/uL   Lymphocytes Relative 21 %   Lymphs Abs 2.1 0.7 - 4.0 K/uL   Monocytes Relative 9 %   Monocytes Absolute 0.9 0.1 - 1.0 K/uL   Eosinophils Relative 2 %   Eosinophils Absolute 0.2 0.0 - 0.5 K/uL   Basophils Relative 1 %   Basophils Absolute 0.1 0.0 - 0.1 K/uL   Immature Granulocytes 0 %   Abs Immature Granulocytes 0.03 0.00 - 0.07 K/uL  Protime-INR     Status: None   Collection Time: 02/11/21  8:26 AM  Result Value Ref Range   Prothrombin Time 13.3 11.4 - 15.2 seconds   INR 1.0 0.8 - 1.2  APTT     Status: None   Collection Time: 02/11/21  8:26 AM  Result Value Ref Range   aPTT 32 24 - 36 seconds  CK     Status: Abnormal   Collection Time: 02/11/21  8:26 AM  Result Value Ref Range   Total CK 17 (L) 49 - 397 U/L  Lactic acid, plasma     Status: None   Collection Time: 02/11/21  9:58 AM  Result Value Ref Range   Lactic Acid, Venous 1.2 0.5 - 1.9 mmol/L   ____________________________________________  EKG My review and personal interpretation at Time: 10:22   Indication: fall  Rate: 100  Rhythm: sinus Axis: normal Other: normal intervals, no stemi ____________________________________________  RADIOLOGY  I personally reviewed all radiographic images ordered to evaluate for the above acute complaints and reviewed radiology reports and findings.  These findings were personally discussed with the patient.  Please see medical record for radiology report.  ____________________________________________   PROCEDURES  Procedure(s) performed:  Procedures    Critical Care performed: no ____________________________________________   INITIAL IMPRESSION / ASSESSMENT AND PLAN /  ED COURSE  Pertinent labs & imaging results that were available during my care of the patient were reviewed by me and considered in my medical decision making (see chart for details).   DDX:  Dehydration, sepsis, pna, uti, hypoglycemia, cva, drug effect, withdrawal, fracture, contusion   Ernest [name] is a 85 y.o. who presents to the ED with presentation as described above.  Uncomfortable but nontoxic-appearing.  History limited due to his advanced dementia.  He is coming from home where he is cared for by his elderly wife.  Has had increasing falls.  Blood will be sent for the but differential.  Will order CT imaging.  Clinical Course as of 02/11/21 1321  Fri Feb 11, 2021  1301 Patient becoming increasingly agitated clearly is in some discomfort likely from compression fracture.  Neurovascular intact distally.  Is is coming from home with this new fracture family does not feel that she is able to care for him at home.  I will consult hospitalist. [PR]    Clinical Course User Index [PR] Merlyn Lot, MD    The patient was evaluated in Emergency Department today for the symptoms described in the history of present illness. He/she was evaluated in the context of the global COVID-19 pandemic, which necessitated consideration that the patient might be at risk for infection with the SARS-CoV-2 virus that causes COVID-19. Institutional protocols and algorithms that pertain to the evaluation of patients at risk for COVID-19 are in a state of rapid change based on information released by regulatory bodies including the CDC and federal and state organizations. These policies and algorithms were followed during the patient's care in the ED.  As part of my medical decision making, I reviewed the following data within the [facility] notes reviewed and incorporated, Labs reviewed, notes from prior ED visits and [place] Controlled Substance Database   ____________________________________________   FINAL CLINICAL IMPRESSION(S) / ED DIAGNOSES  Final diagnoses:  Altered mental status, unspecified altered mental status type  Compression fracture of lumbar vertebra,  open, initial encounter ([place])      NEW MEDICATIONS STARTED DURING THIS VISIT:  New Prescriptions   No medications on file     Note:  This document was prepared using Dragon voice recognition software and may include unintentional dictation errors.    Merlyn Lot, MD 02/11/21 1321

## 2021-02-11 NOTE — H&P (Signed)
History and Physical    Ernest [name] [id number] DOB: 07-22-1935 DOA: 02/11/2021  PCP: Derinda Late, MD   Patient coming from: Home  I have personally briefly reviewed patient's old medical records in [place]  Chief Complaint: Change in mental status  Most of the history was obtained from patient's wife at the bedside.  Patient is unable to provide any history due to his underlying dementia.  HPI: Ernest [name] is a 85 y.o. male with medical history significant for Paroxysmal A. fib on Eliquis, dementia, BPH, hypothyroidism, type 2 diabetes and hypertension who was brought to the ED by EMS for evaluation of low back pain and worsening confusion.  Patient's wife notes that he fell about a week ago and was able to get back up with assistance from family.  The night prior to his admission he fell again landing on his right side and was unable to get up.  His nephew was able to get him up and put him in bed but patient started complaining of pain in his lower back and inability to get up.  She also notes that he has become increasingly confused and agitated and is difficult to redirect.  She is his only caregiver and states that his worsening condition has taken a toll on her health. I am unable to do review of systems due to his underlying dementia. Labs show sodium 136, potassium 4.4 chloride 103, bicarb 25, glucose 212, BUN 20, creatinine 0.67, calcium 10.2, alkaline phosphatase 94, albumin 3.8, AST 21, ALT 16, total protein 7.0, total CK17, lactic acid 1.2, white count 10.1, hemoglobin 14.5, hematocrit 40.6, MCV 84.8, RDW 12.5, platelet count 189, PT 13.3, INR 1.0 Chest x-ray reviewed by me shows low lung volumes with subsegmental atelectasis. CT scan of head and cervical spine shows no acute intracranial findings.Advanced chronic microvascular ischemic change and cerebral volume loss, progressed from prior. No acute fracture or traumatic listhesis of the cervical spine.  Mild multilevel cervical spondylosis, most pronounced at C3-4. CT scan of abdomen and pelvis with contrast shows acute or subacute L1 compression fracture with mild height loss.No evidence of intra-abdominal injury. Chronic findings are described above. Twelve-lead EKG reviewed by me shows normal sinus rhythm.  ED Course: Patient is an 85 year old male with a history of dementia who was brought into the ER for evaluation of mental status changes and frequent falls. He has an acute/subacute L1 compression fracture and is unable to ambulate due to pain. He will be referred to observation status for further evaluation.     Review of Systems: As per HPI otherwise all other systems reviewed and negative.    Past Medical History:  Diagnosis Date   Aortic atherosclerosis (HCC)    Arthritis    BPH (benign prostatic hypertrophy)    Coronary artery disease    a. Cath 12/2013->[facility]->Med rx.   Deviated nasal septum    Diabetes mellitus without complication (HCC)    type 2   Essential hypertension    GERD (gastroesophageal reflux disease)    Hearing deficit    wears hearing aids   Hyperlipidemia    Lupus (HCC)    discoid of scalp only   PAF (paroxysmal atrial fibrillation) ([place])    a. CHA2DS2VASc = 7-->eliquis;  b. 03/2015 Echo: EF 55-60%, Gr1 DD, mild MR, mildly dil LA, PASP 42mHg.   Pneumonia    as a child   Presence of permanent cardiac pacemaker 08/16/2015   PVD (peripheral vascular disease) ([place]  Rosacea    Stroke [facility])    Thyroid disease    Urine frequency     Past Surgical History:  Procedure Laterality Date   CARDIAC CATHETERIZATION     [facility] 12/24/13: 30% LM , pLAD, ostial CX. 20% mLAD, mRCA. 70% OM2 (small vessel). Med RX.    CARDIAC CATHETERIZATION     [facility]   CARDIAC CATHETERIZATION     [facility]   CATARACT EXTRACTION W/ INTRAOCULAR LENS IMPLANT     right   COLONOSCOPY     EP IMPLANTABLE DEVICE N/A 08/16/2015   Procedure: Pacemaker Implant;  Surgeon: Deboraha Sprang,  MD;  Location: [place] [facility];  Service: Cardiovascular;  Laterality: N/A;   LUMBAR LAMINECTOMY/DECOMPRESSION MICRODISCECTOMY N/A 01/04/2015   Procedure: LUMBAR LAMINECTOMY/DECOMPRESSION MICRODISCECTOMY 3 LEVELS;  Surgeon: Newman Pies, MD;  Location: [place] [facility];  Service: Neurosurgery;  Laterality: N/A;  L23 L34 L45 laminectomies and foraminotomies   MULTIPLE TOOTH EXTRACTIONS     PROSTATE BIOPSY       reports that he has quit smoking. His smoking use included cigarettes. He has never used smokeless tobacco. He reports that he does not drink alcohol and does not use drugs.  No Known Allergies  Family History  Problem Relation Age of Onset   Heart attack Mother    Heart attack Father    Cancer - Lung Sister    Cancer - Lung Brother    Cancer - Lung Brother    Cancer Brother    Diabetes Sister       Prior to Admission medications   Medication Sig Start Date End Date Taking? Authorizing Provider  apixaban (ELIQUIS) 5 MG TABS tablet Take 1 tablet (5 mg total) by mouth 2 (two) times daily. 12/13/20   Deboraha Sprang, MD  atorvastatin (LIPITOR) 40 MG tablet Take 1 tablet (40 mg total) by mouth daily. 01/15/21 01/15/22  George Hugh, MD  diltiazem (CARDIZEM CD) 120 MG 24 hr capsule Take 1 capsule (120 mg total) by mouth daily. Take as needed for HR greater than 140 bpm 01/15/21 02/14/21  George Hugh, MD  glipiZIDE (GLUCOTROL XL) 5 MG 24 hr tablet Take 10 mg by mouth daily with breakfast. 01/13/19 12/05/21  [provider]  levothyroxine (SYNTHROID) 125 MCG tablet Take 125 mcg by mouth daily before breakfast.     [provider]  memantine (NAMENDA) 5 MG tablet Take 5 mg by mouth 2 (two) times daily.  02/19/18   [provider]  metronidazole (NORITATE) 1 % cream Apply 1 application topically daily.    [provider]  Multiple Vitamin (MULTIVITAMIN) tablet Take 1 tablet by mouth daily.    [provider]  QUEtiapine (SEROQUEL) 50 MG tablet TAKE 1  TABLET(50 MG) BY MOUTH EVERY NIGHT 07/23/20   [provider]  sertraline (ZOLOFT) 25 MG tablet Take 25 mg by mouth daily.  12/12/18   [provider]  tamsulosin (FLOMAX) 0.4 MG CAPS capsule Take 0.4 mg by mouth 2 (two) times daily.    [provider]  lovastatin (MEVACOR) 40 MG tablet Take 40 mg by mouth at bedtime.   01/15/21  [provider]    Physical Exam: Vitals:   02/11/21 0921 02/11/21 1033 02/11/21 1135 02/11/21 1320  BP:  (!) 143/112 140/77 (!) 141/69  Pulse:  95 79 88  Resp:  '17 17 17  '$ Temp:      TempSrc:      SpO2:  96% 99% 94%  Weight: 75.6  kg     Height: '5\' 7"'$  (1.702 m)        Vitals:   02/11/21 0921 02/11/21 1033 02/11/21 1135 02/11/21 1320  BP:  (!) 143/112 140/77 (!) 141/69  Pulse:  95 79 88  Resp:  '17 17 17  '$ Temp:      TempSrc:      SpO2:  96% 99% 94%  Weight: 75.6 kg     Height: '5\' 7"'$  (1.702 m)         Constitutional: Sedated. Not in any apparent distress HEENT:      Head: Normocephalic and atraumatic.         Eyes: PERLA, EOMI, Conjunctivae are normal. Sclera is non-icteric.       Mouth/Throat: Mucous membranes are moist.       Neck: Supple with no signs of meningismus. Cardiovascular: Regular rate and rhythm. No murmurs, gallops, or rubs. 2+ symmetrical distal pulses are present . No JVD. No LE edema Respiratory: Respiratory effort normal .Lungs sounds clear bilaterally. No wheezes, crackles, or rhonchi.  Gastrointestinal: Soft, non tender, and non distended with positive bowel sounds.  Genitourinary: No CVA tenderness. Musculoskeletal: Nontender with normal range of motion in all extremities. No cyanosis, or erythema of extremities. Neurologic:  Face is symmetric. Moving all extremities. No gross focal neurologic deficits . Skin: Skin is warm, dry.  No rash or ulcers Psychiatric: Unable to assess   Labs on Admission: I have personally reviewed following labs and imaging studies  CBC: Recent Labs  Lab  02/11/21 0826  WBC 10.1  NEUTROABS 6.8  HGB 14.5  HCT 40.6  MCV 84.8  PLT 99991111   Basic Metabolic Panel: Recent Labs  Lab 02/11/21 0826  NA 136  K 4.4  CL 103  CO2 25  GLUCOSE 212*  BUN 20  CREATININE 0.67  CALCIUM 10.2   GFR: Estimated Creatinine Clearance: 66.6 mL/min (by C-G formula based on SCr of 0.67 mg/dL). Liver Function Tests: Recent Labs  Lab 02/11/21 0826  AST 21  ALT 16  ALKPHOS 94  BILITOT 1.1  PROT 7.0  ALBUMIN 3.8   No results for input(s): LIPASE, AMYLASE in the last 168 hours. No results for input(s): AMMONIA in the last 168 hours. Coagulation Profile: Recent Labs  Lab 02/11/21 0826  INR 1.0   Cardiac Enzymes: Recent Labs  Lab 02/11/21 0826  CKTOTAL 17*   BNP (last 3 results) No results for input(s): PROBNP in the last 8760 hours. HbA1C: No results for input(s): HGBA1C in the last 72 hours. CBG: No results for input(s): GLUCAP in the last 168 hours. Lipid Profile: No results for input(s): CHOL, HDL, LDLCALC, TRIG, CHOLHDL, LDLDIRECT in the last 72 hours. Thyroid Function Tests: No results for input(s): TSH, T4TOTAL, FREET4, T3FREE, THYROIDAB in the last 72 hours. Anemia Panel: No results for input(s): VITAMINB12, FOLATE, FERRITIN, TIBC, IRON, RETICCTPCT in the last 72 hours. Urine analysis:    Component Value Date/Time   COLORURINE YELLOW (A) 08/09/2019 0109   APPEARANCEUR CLEAR (A) 08/09/2019 0109   APPEARANCEUR Clear 09/17/2013 2326   LABSPEC 1.017 08/09/2019 0109   LABSPEC 1.007 09/17/2013 2326   PHURINE 5.0 08/09/2019 0109   GLUCOSEU >=500 (A) 08/09/2019 0109   GLUCOSEU >=500 09/17/2013 2326   HGBUR NEGATIVE 08/09/2019 0109   BILIRUBINUR NEGATIVE 08/09/2019 0109   BILIRUBINUR Negative 09/17/2013 2326   KETONESUR NEGATIVE 08/09/2019 0109   PROTEINUR NEGATIVE 08/09/2019 0109   NITRITE NEGATIVE 08/09/2019 0109   LEUKOCYTESUR NEGATIVE 08/09/2019 0109  LEUKOCYTESUR Negative 09/17/2013 2326    Radiological Exams on  Admission: CT HEAD WO CONTRAST (5MM)  Result Date: 02/11/2021 CLINICAL DATA:  Head trauma, minor (Age >= 65y); Neck trauma (Age >= 65y) EXAM: CT HEAD WITHOUT CONTRAST CT CERVICAL SPINE WITHOUT CONTRAST TECHNIQUE: Multidetector CT imaging of the head and cervical spine was performed following the standard protocol without intravenous contrast. Multiplanar CT image reconstructions of the cervical spine were also generated. COMPARISON:  08/10/2011 FINDINGS: CT HEAD FINDINGS Brain: No evidence of acute infarction, hemorrhage, hydrocephalus, extra-axial collection or mass lesion/mass effect. Extensive low-density changes within the periventricular and subcortical white matter compatible with chronic microvascular ischemic change, progressed from prior. Mild-moderate diffuse cerebral volume loss. Vascular: Atherosclerotic calcifications involving the large vessels of the skull base. No unexpected hyperdense vessel. Skull: Normal. Negative for fracture or focal lesion. Sinuses/Orbits: No acute finding. Other: Negative for scalp hematoma. CT CERVICAL SPINE FINDINGS Alignment: Facet joints are aligned without dislocation or traumatic listhesis. Dens and lateral masses are aligned. Skull base and vertebrae: No acute fracture. No primary bone lesion or focal pathologic process. Soft tissues and spinal canal: No prevertebral fluid or swelling. No visible canal hematoma. Disc levels: Degenerative disc disease most pronounced at C3-4 with associated uncovertebral spurring and disc height loss. Mild multilevel facet arthrosis. Upper chest: Negative. Other: Bilateral carotid atherosclerosis. IMPRESSION: 1. No acute intracranial findings. 2. Advanced chronic microvascular ischemic change and cerebral volume loss, progressed from prior. 3. No acute fracture or traumatic listhesis of the cervical spine. 4. Mild multilevel cervical spondylosis, most pronounced at C3-4. Electronically Signed   By: Davina Poke D.O.   On:  02/11/2021 11:55   CT Cervical Spine Wo Contrast  Result Date: 02/11/2021 CLINICAL DATA:  Head trauma, minor (Age >= 65y); Neck trauma (Age >= 65y) EXAM: CT HEAD WITHOUT CONTRAST CT CERVICAL SPINE WITHOUT CONTRAST TECHNIQUE: Multidetector CT imaging of the head and cervical spine was performed following the standard protocol without intravenous contrast. Multiplanar CT image reconstructions of the cervical spine were also generated. COMPARISON:  08/10/2011 FINDINGS: CT HEAD FINDINGS Brain: No evidence of acute infarction, hemorrhage, hydrocephalus, extra-axial collection or mass lesion/mass effect. Extensive low-density changes within the periventricular and subcortical white matter compatible with chronic microvascular ischemic change, progressed from prior. Mild-moderate diffuse cerebral volume loss. Vascular: Atherosclerotic calcifications involving the large vessels of the skull base. No unexpected hyperdense vessel. Skull: Normal. Negative for fracture or focal lesion. Sinuses/Orbits: No acute finding. Other: Negative for scalp hematoma. CT CERVICAL SPINE FINDINGS Alignment: Facet joints are aligned without dislocation or traumatic listhesis. Dens and lateral masses are aligned. Skull base and vertebrae: No acute fracture. No primary bone lesion or focal pathologic process. Soft tissues and spinal canal: No prevertebral fluid or swelling. No visible canal hematoma. Disc levels: Degenerative disc disease most pronounced at C3-4 with associated uncovertebral spurring and disc height loss. Mild multilevel facet arthrosis. Upper chest: Negative. Other: Bilateral carotid atherosclerosis. IMPRESSION: 1. No acute intracranial findings. 2. Advanced chronic microvascular ischemic change and cerebral volume loss, progressed from prior. 3. No acute fracture or traumatic listhesis of the cervical spine. 4. Mild multilevel cervical spondylosis, most pronounced at C3-4. Electronically Signed   By: Davina Poke D.O.    On: 02/11/2021 11:55   CT ABDOMEN PELVIS W CONTRAST  Result Date: 02/11/2021 CLINICAL DATA:  Abdominal trauma, minor. EXAM: CT ABDOMEN AND PELVIS WITH CONTRAST TECHNIQUE: Multidetector CT imaging of the abdomen and pelvis was performed using the standard protocol following bolus administration of intravenous  contrast. CONTRAST:  45m OMNIPAQUE IOHEXOL 350 MG/ML SOLN COMPARISON:  11/15/2017 FINDINGS: Lower chest: No posttraumatic finding. Atherosclerosis of the aorta and coronaries. Dual-chamber pacer with leads looping into the IVC. Hepatobiliary: Steatosis of the liver.No evidence of biliary obstruction or stone. Pancreas: Generalized fatty infiltration. Spleen: Unremarkable. Adrenals/Urinary Tract: Negative adrenals. No hydronephrosis or stone. Small renal cystic densities. Prominent bladder wall thickness, likely related to chronic prostate enlargement. Stomach/Bowel: No obstruction. No appendicitis. Innumerable sigmoid diverticula. No evidence of bowel injury. Vascular/Lymphatic: No acute vascular abnormality. Extensive atheromatous calcification of the aorta and iliacs. Chronic narrowing at the celiac origin which is accentuated compared to prior, shape implicating median arcuate ligament in addition to atherosclerosis. No mass or adenopathy. Reproductive:No pathologic findings. Other: No ascites or pneumoperitoneum. Musculoskeletal: No acute abnormalities. Laminectomies at L3 and L4. L1 inferior endplate fracture with horizontal lucency following the depressed inferior endplate, acute to subacute in appearance IMPRESSION: 1. Acute or subacute L1 compression fracture with mild height loss. 2. No evidence of intra-abdominal injury. 3. Chronic findings are described above. Aortic Atherosclerosis (ICD10-I70.0). Electronically Signed   By: JMonte FantasiaM.D.   On: 02/11/2021 11:57   DG Chest Port 1 View  Result Date: 02/11/2021 CLINICAL DATA:  Altered mental status. EXAM: PORTABLE CHEST 1 VIEW COMPARISON:   01/14/2021 FINDINGS: Numerous leads and wires project over the chest. Pacer with leads at right atrium and right ventricle. No lead discontinuity. Midline trachea. Normal heart size and mediastinal contours for age. Atherosclerosis in the transverse aorta. Mild right hemidiaphragm elevation. No pleural effusion or pneumothorax. Mild biapical pleural thickening. Low lung volumes with resultant pulmonary interstitial prominence. No congestive failure. Mild bibasilar subsegmental atelectasis, similar. IMPRESSION: Low lung volumes with mild subsegmental atelectasis. No evidence of pneumonia or other explanation for altered mental status. Aortic Atherosclerosis (ICD10-I70.0). Electronically Signed   By: KAbigail MiyamotoM.D.   On: 02/11/2021 10:14     Assessment/Plan Principal Problem:   Compression fracture of lumbar vertebra (HCC) Active Problems:   Type 2 diabetes mellitus (HCC)   PAF (paroxysmal atrial fibrillation) (HCC)   Coronary artery disease   Essential hypertension   Dementia with behavioral disturbance (HCC)   Falls   Frequent falls With compression fracture of lumbar vertebrae Pain control with scheduled Tylenol, Lidoderm patch and as needed oxycodone Orthopedic surgery has been consulted and plans on doing kyphoplasty on 02/14/21 Place patient on fall precautions    Dementia with behavioral disturbances Continue Namenda, Seroquel and Zoloft Will place patient on as needed Haldol for agitation He will need a safety sitter    Hypothyroidism Continue Synthroid    History of paroxysmal atrial fibrillation Will hold Eliquis for planned procedure Continue Cardizem for rate control   BPH Continue Flomax   DVT prophylaxis: SCD  Code Status: DNR Family Communication: Greater than 50% of time was spent discussing patient's condition and plan of care with his wife at the bedside.  All questions and concerns have been addressed.  She verbalizes understanding and agrees with the  plan.  CODE STATUS was discussed and he is a DO NOT RESUSCITATE. Disposition: Skilled nursing facility Consults called: Orthopedic surgery Status: Observation    Flemon Kelty MD [facility]     02/11/2021, 2:17 PM

## 2021-02-11 NOTE — Progress Notes (Signed)
Orthopedic Tech Progress Note Patient Details:  Ernest [name] Aug 28, 1935 [id number] Ordered brace Patient ID: Ernest [name], male   DOB: January 29, 1936, 85 y.o.   MRN: [id number]  Ernest [name] 02/11/2021, 8:30 PM

## 2021-02-12 ENCOUNTER — Other Ambulatory Visit: Payer: Self-pay

## 2021-02-12 DIAGNOSIS — S32010A Wedge compression fracture of first lumbar vertebra, initial encounter for closed fracture: Secondary | ICD-10-CM | POA: Diagnosis present

## 2021-02-12 DIAGNOSIS — S32000A Wedge compression fracture of unspecified lumbar vertebra, initial encounter for closed fracture: Secondary | ICD-10-CM | POA: Diagnosis present

## 2021-02-12 DIAGNOSIS — Z66 Do not resuscitate: Secondary | ICD-10-CM | POA: Diagnosis present

## 2021-02-12 DIAGNOSIS — R451 Restlessness and agitation: Secondary | ICD-10-CM | POA: Diagnosis present

## 2021-02-12 DIAGNOSIS — I700 Atherosclerosis of aorta: Secondary | ICD-10-CM | POA: Diagnosis present

## 2021-02-12 DIAGNOSIS — L930 Discoid lupus erythematosus: Secondary | ICD-10-CM | POA: Diagnosis present

## 2021-02-12 DIAGNOSIS — I2510 Atherosclerotic heart disease of native coronary artery without angina pectoris: Secondary | ICD-10-CM | POA: Diagnosis present

## 2021-02-12 DIAGNOSIS — Z8673 Personal history of transient ischemic attack (TIA), and cerebral infarction without residual deficits: Secondary | ICD-10-CM | POA: Diagnosis not present

## 2021-02-12 DIAGNOSIS — E039 Hypothyroidism, unspecified: Secondary | ICD-10-CM | POA: Diagnosis present

## 2021-02-12 DIAGNOSIS — Z961 Presence of intraocular lens: Secondary | ICD-10-CM | POA: Diagnosis present

## 2021-02-12 DIAGNOSIS — N400 Enlarged prostate without lower urinary tract symptoms: Secondary | ICD-10-CM | POA: Diagnosis present

## 2021-02-12 DIAGNOSIS — H9190 Unspecified hearing loss, unspecified ear: Secondary | ICD-10-CM | POA: Diagnosis present

## 2021-02-12 DIAGNOSIS — Z8249 Family history of ischemic heart disease and other diseases of the circulatory system: Secondary | ICD-10-CM | POA: Diagnosis not present

## 2021-02-12 DIAGNOSIS — F0391 Unspecified dementia with behavioral disturbance: Secondary | ICD-10-CM | POA: Diagnosis present

## 2021-02-12 DIAGNOSIS — Z7189 Other specified counseling: Secondary | ICD-10-CM | POA: Diagnosis not present

## 2021-02-12 DIAGNOSIS — S32010K Wedge compression fracture of first lumbar vertebra, subsequent encounter for fracture with nonunion: Secondary | ICD-10-CM | POA: Diagnosis not present

## 2021-02-12 DIAGNOSIS — M47812 Spondylosis without myelopathy or radiculopathy, cervical region: Secondary | ICD-10-CM | POA: Diagnosis present

## 2021-02-12 DIAGNOSIS — I10 Essential (primary) hypertension: Secondary | ICD-10-CM | POA: Diagnosis present

## 2021-02-12 DIAGNOSIS — Z950 Presence of cardiac pacemaker: Secondary | ICD-10-CM | POA: Diagnosis not present

## 2021-02-12 DIAGNOSIS — Z974 Presence of external hearing-aid: Secondary | ICD-10-CM | POA: Diagnosis not present

## 2021-02-12 DIAGNOSIS — E785 Hyperlipidemia, unspecified: Secondary | ICD-10-CM | POA: Diagnosis present

## 2021-02-12 DIAGNOSIS — I480 Paroxysmal atrial fibrillation: Secondary | ICD-10-CM | POA: Diagnosis present

## 2021-02-12 DIAGNOSIS — Z833 Family history of diabetes mellitus: Secondary | ICD-10-CM | POA: Diagnosis not present

## 2021-02-12 DIAGNOSIS — Z20822 Contact with and (suspected) exposure to covid-19: Secondary | ICD-10-CM | POA: Diagnosis present

## 2021-02-12 DIAGNOSIS — K219 Gastro-esophageal reflux disease without esophagitis: Secondary | ICD-10-CM | POA: Diagnosis present

## 2021-02-12 DIAGNOSIS — E1151 Type 2 diabetes mellitus with diabetic peripheral angiopathy without gangrene: Secondary | ICD-10-CM | POA: Diagnosis present

## 2021-02-12 DIAGNOSIS — R296 Repeated falls: Secondary | ICD-10-CM | POA: Diagnosis present

## 2021-02-12 LAB — RESP PANEL BY RT-PCR (FLU A&B, COVID) ARPGX2
Influenza A by PCR: NEGATIVE
Influenza B by PCR: NEGATIVE
SARS Coronavirus 2 by RT PCR: NEGATIVE

## 2021-02-12 MED ORDER — ENOXAPARIN SODIUM 40 MG/0.4ML IJ SOSY
40.0000 mg | PREFILLED_SYRINGE | INTRAMUSCULAR | Status: DC
  Administered 2021-02-12: 40 mg via SUBCUTANEOUS
  Filled 2021-02-12: qty 0.4

## 2021-02-12 NOTE — Plan of Care (Signed)
Patient resting in bed at this time.Pt alert to self,disoriented to place,time and situation d/t dementia.Pt restless and agitated since the beginning of shift.Attempts to reorient pt was unsuccessful.Pt attempting to get OOB,by elevating legs on the bedside railings.Pt was offered fluids and apple sauce,Pt spit the apple sauce on sitter and this nurse,was also swinging arms.Mitts placed on hands to prevent hitting at objects or pulling out IV line.will cont to monitor.Bed alarm checked for function w/o any malfunction noted.

## 2021-02-12 NOTE — Progress Notes (Signed)
Patient alert to self, Disoriented to time, place, and situation. Wife at bedside. Patient cooperative with staff. TLSO brace ordered and should arrive tomorrow morning.    2145: Patient trying to get out of bed and combative with nursing staff. PRN medication administered, relaxation techniques and safety precautions in place. call bell and phone within patient reach. Will continue to monitor.

## 2021-02-12 NOTE — Progress Notes (Addendum)
PROGRESS NOTE    Ernest [name]  [id number] DOB: 1935-12-23 DOA: 02/11/2021 PCP: Derinda Late, MD  150A/150A-AA   Assessment & Plan:   Principal Problem:   Compression fracture of lumbar vertebra ([place]) Active Problems:   Type 2 diabetes mellitus (HCC)   PAF (paroxysmal atrial fibrillation) (HCC)   Coronary artery disease   Essential hypertension   Dementia with behavioral disturbance (HCC)   Falls   Hypothyroidism   Compression fracture of lumbosacral spine (HCC)   Ernest [name] is a 85 y.o. male with medical history significant for Paroxysmal A. fib on Eliquis, dementia, BPH, hypothyroidism, type 2 diabetes and hypertension who was brought to the ED by EMS for evaluation of low back pain and worsening confusion. Patient's wife notes that he fell about a week ago and was able to get back up with assistance from family.  The night prior to his admission he fell again landing on his right side and was unable to get up.  His nephew was able to get him up and put him in bed but patient started complaining of pain in his lower back and inability to get up.  She also notes that he has become increasingly confused and agitated and is difficult to redirect.  She is his only caregiver and states that his worsening condition has taken a toll on her health.   compression fracture of lumbar vertebrae Pain control with scheduled Tylenol, Lidoderm patch and as needed oxycodone --ortho plan for kyphoplasty on 02/14/21   Dementia with behavioral disturbances --on home Namenda, Seroquel and Zoloft --cont home regimen --cont mittens, sitter if pt is a danger to himself or others   Hypothyroidism --cont Syndhroid   History of paroxysmal atrial fibrillation --cont cardizem --hold Eliquis    BPH Continue Flomax  Frequent falls --PT/OT after kyphoplasty    DVT prophylaxis: Lovenox SQ Code Status: DNR  Family Communication: wife updated at bedside Level of care:  Med-Surg Dispo:   The patient is from: home Anticipated d/c is to: undetermined Anticipated d/c date is: undetermined Patient currently is not medically ready to d/c due to: pending kyphoplasty   Subjective and Interval History:  Pt has been confused, intermittently agitated.     Objective: Vitals:   02/12/21 0534 02/12/21 0823 02/12/21 1205 02/12/21 1622  BP: (!) 147/93 (!) 159/65 131/81 (!) 156/60  Pulse: 74 72 78 82  Resp: '18 16 17 18  '$ Temp: 98 F (36.7 C) 98.8 F (37.1 C) 99.1 F (37.3 C) 98.8 F (37.1 C)  TempSrc:  Oral Oral Oral  SpO2: 98% 95% 99% 96%  Weight:      Height:        Intake/Output Summary (Last 24 hours) at 02/12/2021 1720 Last data filed at 02/11/2021 2200 Gross per 24 hour  Intake 5 ml  Output --  Net 5 ml   Filed Weights   02/11/21 0921 02/11/21 1504 02/11/21 1947  Weight: 75.6 kg 74.8 kg 71.7 kg    Examination:   Constitutional: NAD, awake, not oriented, mittens on HEENT: conjunctivae and lids normal, EOMI CV: No cyanosis.   RESP: normal respiratory effort, on RA Extremities: No effusions, edema in BLE SKIN: warm, dry   Data Reviewed: I have personally reviewed following labs and imaging studies  CBC: Recent Labs  Lab 02/11/21 0826  WBC 10.1  NEUTROABS 6.8  HGB 14.5  HCT 40.6  MCV 84.8  PLT 99991111   Basic Metabolic Panel: Recent Labs  Lab 02/11/21 0826  NA 136  K 4.4  CL 103  CO2 25  GLUCOSE 212*  BUN 20  CREATININE 0.67  CALCIUM 10.2   GFR: Estimated Creatinine Clearance: 66.6 mL/min (by C-G formula based on SCr of 0.67 mg/dL). Liver Function Tests: Recent Labs  Lab 02/11/21 0826  AST 21  ALT 16  ALKPHOS 94  BILITOT 1.1  PROT 7.0  ALBUMIN 3.8   No results for input(s): LIPASE, AMYLASE in the last 168 hours. No results for input(s): AMMONIA in the last 168 hours. Coagulation Profile: Recent Labs  Lab 02/11/21 0826  INR 1.0   Cardiac Enzymes: Recent Labs  Lab 02/11/21 0826  CKTOTAL 17*   BNP (last 3  results) No results for input(s): PROBNP in the last 8760 hours. HbA1C: No results for input(s): HGBA1C in the last 72 hours. CBG: No results for input(s): GLUCAP in the last 168 hours. Lipid Profile: No results for input(s): CHOL, HDL, LDLCALC, TRIG, CHOLHDL, LDLDIRECT in the last 72 hours. Thyroid Function Tests: No results for input(s): TSH, T4TOTAL, FREET4, T3FREE, THYROIDAB in the last 72 hours. Anemia Panel: No results for input(s): VITAMINB12, FOLATE, FERRITIN, TIBC, IRON, RETICCTPCT in the last 72 hours. Sepsis Labs: Recent Labs  Lab 02/11/21 0958 02/11/21 1501  LATICACIDVEN 1.2 1.1    Recent Results (from the past 240 hour(s))  Resp Panel by RT-PCR (Flu A&B, Covid)     Status: None   Collection Time: 02/12/21  1:50 AM  Result Value Ref Range Status   SARS Coronavirus 2 by RT PCR NEGATIVE NEGATIVE Final    Comment: (NOTE) SARS-CoV-2 target nucleic acids are NOT DETECTED.  The SARS-CoV-2 RNA is generally detectable in upper respiratory specimens during the acute phase of infection. The lowest concentration of SARS-CoV-2 viral copies this assay can detect is 138 copies/mL. A negative result does not preclude SARS-Cov-2 infection and should not be used as the sole basis for treatment or other patient management decisions. A negative result may occur with  improper specimen collection/handling, submission of specimen other than nasopharyngeal swab, presence of viral mutation(s) within the areas targeted by this assay, and inadequate number of viral copies(<138 copies/mL). A negative result must be combined with clinical observations, patient history, and epidemiological information. The expected result is Negative.  Fact Sheet for Patients:  EntrepreneurPulse.com.au  Fact Sheet for Healthcare Providers:  IncredibleEmployment.be  This test is no t yet approved or cleared by the [place] FDA and  has been authorized for  detection and/or diagnosis of SARS-CoV-2 by FDA under an Emergency Use Authorization (EUA). This EUA will remain  in effect (meaning this test can be used) for the duration of the COVID-19 declaration under Section 564(b)(1) of the Act, 21 U.S.C.section 360bbb-3(b)(1), unless the authorization is terminated  or revoked sooner.       Influenza A by PCR NEGATIVE NEGATIVE Final   Influenza B by PCR NEGATIVE NEGATIVE Final    Comment: (NOTE) The Xpert Xpress SARS-CoV-2/FLU/RSV plus assay is intended as an aid in the diagnosis of influenza from Nasopharyngeal swab specimens and should not be used as a sole basis for treatment. Nasal washings and aspirates are unacceptable for Xpert Xpress SARS-CoV-2/FLU/RSV testing.  Fact Sheet for Patients: EntrepreneurPulse.com.au  Fact Sheet for Healthcare Providers: IncredibleEmployment.be  This test is not yet approved or cleared by the [place] FDA and has been authorized for detection and/or diagnosis of SARS-CoV-2 by FDA under an Emergency Use Authorization (EUA). This EUA will remain in effect (meaning this test can  be used) for the duration of the COVID-19 declaration under Section 564(b)(1) of the Act, 21 U.S.C. section 360bbb-3(b)(1), unless the authorization is terminated or revoked.  Performed at [facility], [address], [place], [place] [address]       Radiology Studies: CT HEAD WO CONTRAST (5MM)  Result Date: 02/11/2021 CLINICAL DATA:  Head trauma, minor (Age >= 65y); Neck trauma (Age >= 65y) EXAM: CT HEAD WITHOUT CONTRAST CT CERVICAL SPINE WITHOUT CONTRAST TECHNIQUE: Multidetector CT imaging of the head and cervical spine was performed following the standard protocol without intravenous contrast. Multiplanar CT image reconstructions of the cervical spine were also generated. COMPARISON:  08/10/2011 FINDINGS: CT HEAD FINDINGS Brain: No evidence of acute infarction, hemorrhage,  hydrocephalus, extra-axial collection or mass lesion/mass effect. Extensive low-density changes within the periventricular and subcortical white matter compatible with chronic microvascular ischemic change, progressed from prior. Mild-moderate diffuse cerebral volume loss. Vascular: Atherosclerotic calcifications involving the large vessels of the skull base. No unexpected hyperdense vessel. Skull: Normal. Negative for fracture or focal lesion. Sinuses/Orbits: No acute finding. Other: Negative for scalp hematoma. CT CERVICAL SPINE FINDINGS Alignment: Facet joints are aligned without dislocation or traumatic listhesis. Dens and lateral masses are aligned. Skull base and vertebrae: No acute fracture. No primary bone lesion or focal pathologic process. Soft tissues and spinal canal: No prevertebral fluid or swelling. No visible canal hematoma. Disc levels: Degenerative disc disease most pronounced at C3-4 with associated uncovertebral spurring and disc height loss. Mild multilevel facet arthrosis. Upper chest: Negative. Other: Bilateral carotid atherosclerosis. IMPRESSION: 1. No acute intracranial findings. 2. Advanced chronic microvascular ischemic change and cerebral volume loss, progressed from prior. 3. No acute fracture or traumatic listhesis of the cervical spine. 4. Mild multilevel cervical spondylosis, most pronounced at C3-4. Electronically Signed   By: Davina Poke D.O.   On: 02/11/2021 11:55   CT Cervical Spine Wo Contrast  Result Date: 02/11/2021 CLINICAL DATA:  Head trauma, minor (Age >= 65y); Neck trauma (Age >= 65y) EXAM: CT HEAD WITHOUT CONTRAST CT CERVICAL SPINE WITHOUT CONTRAST TECHNIQUE: Multidetector CT imaging of the head and cervical spine was performed following the standard protocol without intravenous contrast. Multiplanar CT image reconstructions of the cervical spine were also generated. COMPARISON:  08/10/2011 FINDINGS: CT HEAD FINDINGS Brain: No evidence of acute infarction,  hemorrhage, hydrocephalus, extra-axial collection or mass lesion/mass effect. Extensive low-density changes within the periventricular and subcortical white matter compatible with chronic microvascular ischemic change, progressed from prior. Mild-moderate diffuse cerebral volume loss. Vascular: Atherosclerotic calcifications involving the large vessels of the skull base. No unexpected hyperdense vessel. Skull: Normal. Negative for fracture or focal lesion. Sinuses/Orbits: No acute finding. Other: Negative for scalp hematoma. CT CERVICAL SPINE FINDINGS Alignment: Facet joints are aligned without dislocation or traumatic listhesis. Dens and lateral masses are aligned. Skull base and vertebrae: No acute fracture. No primary bone lesion or focal pathologic process. Soft tissues and spinal canal: No prevertebral fluid or swelling. No visible canal hematoma. Disc levels: Degenerative disc disease most pronounced at C3-4 with associated uncovertebral spurring and disc height loss. Mild multilevel facet arthrosis. Upper chest: Negative. Other: Bilateral carotid atherosclerosis. IMPRESSION: 1. No acute intracranial findings. 2. Advanced chronic microvascular ischemic change and cerebral volume loss, progressed from prior. 3. No acute fracture or traumatic listhesis of the cervical spine. 4. Mild multilevel cervical spondylosis, most pronounced at C3-4. Electronically Signed   By: Davina Poke D.O.   On: 02/11/2021 11:55   CT ABDOMEN PELVIS W CONTRAST  Result  Date: 02/11/2021 CLINICAL DATA:  Abdominal trauma, minor. EXAM: CT ABDOMEN AND PELVIS WITH CONTRAST TECHNIQUE: Multidetector CT imaging of the abdomen and pelvis was performed using the standard protocol following bolus administration of intravenous contrast. CONTRAST:  6m OMNIPAQUE IOHEXOL 350 MG/ML SOLN COMPARISON:  11/15/2017 FINDINGS: Lower chest: No posttraumatic finding. Atherosclerosis of the aorta and coronaries. Dual-chamber pacer with leads looping  into the IVC. Hepatobiliary: Steatosis of the liver.No evidence of biliary obstruction or stone. Pancreas: Generalized fatty infiltration. Spleen: Unremarkable. Adrenals/Urinary Tract: Negative adrenals. No hydronephrosis or stone. Small renal cystic densities. Prominent bladder wall thickness, likely related to chronic prostate enlargement. Stomach/Bowel: No obstruction. No appendicitis. Innumerable sigmoid diverticula. No evidence of bowel injury. Vascular/Lymphatic: No acute vascular abnormality. Extensive atheromatous calcification of the aorta and iliacs. Chronic narrowing at the celiac origin which is accentuated compared to prior, shape implicating median arcuate ligament in addition to atherosclerosis. No mass or adenopathy. Reproductive:No pathologic findings. Other: No ascites or pneumoperitoneum. Musculoskeletal: No acute abnormalities. Laminectomies at L3 and L4. L1 inferior endplate fracture with horizontal lucency following the depressed inferior endplate, acute to subacute in appearance IMPRESSION: 1. Acute or subacute L1 compression fracture with mild height loss. 2. No evidence of intra-abdominal injury. 3. Chronic findings are described above. Aortic Atherosclerosis (ICD10-I70.0). Electronically Signed   By: JMonte FantasiaM.D.   On: 02/11/2021 11:57   DG Chest Port 1 View  Result Date: 02/11/2021 CLINICAL DATA:  Altered mental status. EXAM: PORTABLE CHEST 1 VIEW COMPARISON:  01/14/2021 FINDINGS: Numerous leads and wires project over the chest. Pacer with leads at right atrium and right ventricle. No lead discontinuity. Midline trachea. Normal heart size and mediastinal contours for age. Atherosclerosis in the transverse aorta. Mild right hemidiaphragm elevation. No pleural effusion or pneumothorax. Mild biapical pleural thickening. Low lung volumes with resultant pulmonary interstitial prominence. No congestive failure. Mild bibasilar subsegmental atelectasis, similar. IMPRESSION: Low lung  volumes with mild subsegmental atelectasis. No evidence of pneumonia or other explanation for altered mental status. Aortic Atherosclerosis (ICD10-I70.0). Electronically Signed   By: KAbigail MiyamotoM.D.   On: 02/11/2021 10:14   DG Toe Great Left  Result Date: 02/11/2021 CLINICAL DATA:  Toe injury EXAM: LEFT GREAT TOE COMPARISON:  None. FINDINGS: Nondisplaced fracture of the distal phalanx of the great toe. No significant arthropathy. IMPRESSION: Nondisplaced fracture distal first phalanx left foot Electronically Signed   By: CFranchot GalloM.D.   On: 02/11/2021 14:23     Scheduled Meds:  acetaminophen  1,000 mg Oral TID   atorvastatin  40 mg Oral Daily   diltiazem  120 mg Oral Daily   levothyroxine  125 mcg Oral QAC breakfast   lidocaine  1 patch Transdermal Q24H   memantine  5 mg Oral BID   multivitamin with minerals  1 tablet Oral Daily   QUEtiapine  50 mg Oral QHS   sertraline  25 mg Oral Daily   sodium chloride flush  3 mL Intravenous Q12H   tamsulosin  0.4 mg Oral BID   Continuous Infusions:  sodium chloride       LOS: 0 days     TEnzo Bi MD [facility] If 7PM-7AM, please contact night-coverage 02/12/2021, 5:20 PM

## 2021-02-13 LAB — BASIC METABOLIC PANEL
Anion gap: 4 — ABNORMAL LOW (ref 5–15)
BUN: 19 mg/dL (ref 8–23)
CO2: 29 mmol/L (ref 22–32)
Calcium: 10.3 mg/dL (ref 8.9–10.3)
Chloride: 106 mmol/L (ref 98–111)
Creatinine, Ser: 0.77 mg/dL (ref 0.61–1.24)
GFR, Estimated: >60 mL/min (ref >60)
Glucose, Bld: 246 mg/dL — ABNORMAL HIGH (ref 70–99)
Potassium: 4.8 mmol/L (ref 3.5–5.1)
Sodium: 139 mmol/L (ref 135–145)

## 2021-02-13 LAB — CBC
HCT: 44.4 % (ref 39.0–52.0)
Hemoglobin: 15.6 g/dL (ref 13.0–17.0)
MCH: 30.4 pg (ref 26.0–34.0)
MCHC: 35.1 g/dL (ref 30.0–36.0)
MCV: 86.5 fL (ref 80.0–100.0)
Platelets: 178 10*3/uL (ref 150–400)
RBC: 5.13 MIL/uL (ref 4.22–5.81)
RDW: 12.5 % (ref 11.5–15.5)
WBC: 8.8 10*3/uL (ref 4.0–10.5)
nRBC: 0 % (ref 0.0–0.2)

## 2021-02-13 LAB — MAGNESIUM: Magnesium: 2.2 mg/dL (ref 1.7–2.4)

## 2021-02-13 MED ORDER — POLYETHYLENE GLYCOL 3350 17 G PO PACK
17.0000 g | PACK | Freq: Every day | ORAL | Status: DC
  Administered 2021-02-13 – 2021-02-16 (×3): 17 g via ORAL
  Filled 2021-02-13 (×3): qty 1

## 2021-02-13 NOTE — Progress Notes (Signed)
PROGRESS NOTE    Ernest [name]  [id number] DOB: 02-27-1936 DOA: 02/11/2021 PCP: Derinda Late, MD  150A/150A-AA   Assessment & Plan:   Principal Problem:   Compression fracture of lumbar vertebra ([place]) Active Problems:   Type 2 diabetes mellitus (HCC)   PAF (paroxysmal atrial fibrillation) (HCC)   Coronary artery disease   Essential hypertension   Dementia with behavioral disturbance (HCC)   Falls   Hypothyroidism   Compression fracture of lumbosacral spine (HCC)   Ernest [name] is a 85 y.o. male with medical history significant for Paroxysmal A. fib on Eliquis, dementia, BPH, hypothyroidism, type 2 diabetes and hypertension who was brought to the ED by EMS for evaluation of low back pain and worsening confusion. Patient's wife notes that he fell about a week ago and was able to get back up with assistance from family.  The night prior to his admission he fell again landing on his right side and was unable to get up.  His nephew was able to get him up and put him in bed but patient started complaining of pain in his lower back and inability to get up.  She also notes that he has become increasingly confused and agitated and is difficult to redirect.  She is his only caregiver and states that his worsening condition has taken a toll on her health.   compression fracture of lumbar vertebrae Pain control with scheduled Tylenol, Lidoderm patch and as needed oxycodone --ortho plan for kyphoplasty on 02/14/21   Dementia with behavioral disturbances --on home Namenda, Seroquel and Zoloft --cont home regimen --cont mittens, sitter if pt is a danger to himself or others   Hypothyroidism --cont Syndhroid   History of paroxysmal atrial fibrillation --cont cardizem --hold Eliquis    BPH Continue Flomax  Frequent falls --PT/OT after kyphoplasty    DVT prophylaxis: Lovenox SQ Code Status: DNR  Family Communication: wife updated at bedside Level of care:  Med-Surg Dispo:   The patient is from: home Anticipated d/c is to: undetermined Anticipated d/c date is: undetermined Patient currently is not medically ready to d/c due to: pending kyphoplasty   Subjective and Interval History:  RN noted to be agitated overnight.  Much better during the day.  Ate some food, per wife.   Objective: Vitals:   02/13/21 0500 02/13/21 0736 02/13/21 1217 02/13/21 1554  BP: (!) 155/81 (!) 144/81 (!) 113/96 139/70  Pulse: 97 92 100 90  Resp: '18 15  15  '$ Temp: 98.7 F (37.1 C) 98.3 F (36.8 C)  98.1 F (36.7 C)  TempSrc: Oral     SpO2: 96% 99% 95% 90%  Weight:      Height:       No intake or output data in the 24 hours ending 02/13/21 1825  Filed Weights   02/11/21 0921 02/11/21 1504 02/11/21 1947  Weight: 75.6 kg 74.8 kg 71.7 kg    Examination:   Constitutional: NAD, sleepy, not oriented CV: No cyanosis.   RESP: normal respiratory effort, on RA Extremities: No effusions, edema in BLE SKIN: warm, dry   Data Reviewed: I have personally reviewed following labs and imaging studies  CBC: Recent Labs  Lab 02/11/21 0826 02/13/21 0509  WBC 10.1 8.8  NEUTROABS 6.8  --   HGB 14.5 15.6  HCT 40.6 44.4  MCV 84.8 86.5  PLT 189 0000000   Basic Metabolic Panel: Recent Labs  Lab 02/11/21 0826 02/13/21 0509  NA 136 139  K 4.4 4.8  CL 103 106  CO2 25 29  GLUCOSE 212* 246*  BUN 20 19  CREATININE 0.67 0.77  CALCIUM 10.2 10.3  MG  --  2.2   GFR: Estimated Creatinine Clearance: 66.6 mL/min (by C-G formula based on SCr of 0.77 mg/dL). Liver Function Tests: Recent Labs  Lab 02/11/21 0826  AST 21  ALT 16  ALKPHOS 94  BILITOT 1.1  PROT 7.0  ALBUMIN 3.8   No results for input(s): LIPASE, AMYLASE in the last 168 hours. No results for input(s): AMMONIA in the last 168 hours. Coagulation Profile: Recent Labs  Lab 02/11/21 0826  INR 1.0   Cardiac Enzymes: Recent Labs  Lab 02/11/21 0826  CKTOTAL 17*   BNP (last 3 results) No  results for input(s): PROBNP in the last 8760 hours. HbA1C: No results for input(s): HGBA1C in the last 72 hours. CBG: No results for input(s): GLUCAP in the last 168 hours. Lipid Profile: No results for input(s): CHOL, HDL, LDLCALC, TRIG, CHOLHDL, LDLDIRECT in the last 72 hours. Thyroid Function Tests: No results for input(s): TSH, T4TOTAL, FREET4, T3FREE, THYROIDAB in the last 72 hours. Anemia Panel: No results for input(s): VITAMINB12, FOLATE, FERRITIN, TIBC, IRON, RETICCTPCT in the last 72 hours. Sepsis Labs: Recent Labs  Lab 02/11/21 0958 02/11/21 1501  LATICACIDVEN 1.2 1.1    Recent Results (from the past 240 hour(s))  Resp Panel by RT-PCR (Flu A&B, Covid)     Status: None   Collection Time: 02/12/21  1:50 AM  Result Value Ref Range Status   SARS Coronavirus 2 by RT PCR NEGATIVE NEGATIVE Final    Comment: (NOTE) SARS-CoV-2 target nucleic acids are NOT DETECTED.  The SARS-CoV-2 RNA is generally detectable in upper respiratory specimens during the acute phase of infection. The lowest concentration of SARS-CoV-2 viral copies this assay can detect is 138 copies/mL. A negative result does not preclude SARS-Cov-2 infection and should not be used as the sole basis for treatment or other patient management decisions. A negative result may occur with  improper specimen collection/handling, submission of specimen other than nasopharyngeal swab, presence of viral mutation(s) within the areas targeted by this assay, and inadequate number of viral copies(<138 copies/mL). A negative result must be combined with clinical observations, patient history, and epidemiological information. The expected result is Negative.  Fact Sheet for Patients:  EntrepreneurPulse.com.au  Fact Sheet for Healthcare Providers:  IncredibleEmployment.be  This test is no t yet approved or cleared by the [place] FDA and  has been authorized for detection and/or  diagnosis of SARS-CoV-2 by FDA under an Emergency Use Authorization (EUA). This EUA will remain  in effect (meaning this test can be used) for the duration of the COVID-19 declaration under Section 564(b)(1) of the Act, 21 U.S.C.section 360bbb-3(b)(1), unless the authorization is terminated  or revoked sooner.       Influenza A by PCR NEGATIVE NEGATIVE Final   Influenza B by PCR NEGATIVE NEGATIVE Final    Comment: (NOTE) The Xpert Xpress SARS-CoV-2/FLU/RSV plus assay is intended as an aid in the diagnosis of influenza from Nasopharyngeal swab specimens and should not be used as a sole basis for treatment. Nasal washings and aspirates are unacceptable for Xpert Xpress SARS-CoV-2/FLU/RSV testing.  Fact Sheet for Patients: EntrepreneurPulse.com.au  Fact Sheet for Healthcare Providers: IncredibleEmployment.be  This test is not yet approved or cleared by the [place] FDA and has been authorized for detection and/or diagnosis of SARS-CoV-2 by FDA under an Emergency Use Authorization (EUA). This EUA will remain  in effect (meaning this test can be used) for the duration of the COVID-19 declaration under Section 564(b)(1) of the Act, 21 U.S.C. section 360bbb-3(b)(1), unless the authorization is terminated or revoked.  Performed at [facility], [address], [place], [place] [address]       Radiology Studies: No results found.   Scheduled Meds:  acetaminophen  1,000 mg Oral TID   atorvastatin  40 mg Oral Daily   diltiazem  120 mg Oral Daily   enoxaparin (LOVENOX) injection  40 mg Subcutaneous Q24H   levothyroxine  125 mcg Oral QAC breakfast   lidocaine  1 patch Transdermal Q24H   memantine  5 mg Oral BID   multivitamin with minerals  1 tablet Oral Daily   polyethylene glycol  17 g Oral Daily   QUEtiapine  50 mg Oral QHS   sertraline  25 mg Oral Daily   sodium chloride flush  3 mL Intravenous Q12H   tamsulosin  0.4 mg  Oral BID   Continuous Infusions:  sodium chloride       LOS: 1 day     Enzo Bi, MD [facility] If 7PM-7AM, please contact night-coverage 02/13/2021, 6:25 PM

## 2021-02-14 ENCOUNTER — Encounter: Payer: Self-pay | Admitting: Hospitalist

## 2021-02-14 ENCOUNTER — Inpatient Hospital Stay: Payer: PPO | Admitting: Anesthesiology

## 2021-02-14 ENCOUNTER — Encounter
Admission: EM | Disposition: A | Discharge status: Hospice | Payer: Self-pay | Source: Home / Self Care | Attending: Hospitalist

## 2021-02-14 ENCOUNTER — Inpatient Hospital Stay: Payer: PPO

## 2021-02-14 DIAGNOSIS — S32010A Wedge compression fracture of first lumbar vertebra, initial encounter for closed fracture: Secondary | ICD-10-CM | POA: Diagnosis not present

## 2021-02-14 HISTORY — PX: KYPHOPLASTY: SHX5884

## 2021-02-14 LAB — BASIC METABOLIC PANEL
Anion gap: 5 (ref 5–15)
BUN: 26 mg/dL — ABNORMAL HIGH (ref 8–23)
CO2: 26 mmol/L (ref 22–32)
Calcium: 10.6 mg/dL — ABNORMAL HIGH (ref 8.9–10.3)
Chloride: 106 mmol/L (ref 98–111)
Creatinine, Ser: 0.88 mg/dL (ref 0.61–1.24)
GFR, Estimated: >60 mL/min (ref >60)
Glucose, Bld: 335 mg/dL — ABNORMAL HIGH (ref 70–99)
Potassium: 4.4 mmol/L (ref 3.5–5.1)
Sodium: 137 mmol/L (ref 135–145)

## 2021-02-14 LAB — CBC
HCT: 42 % (ref 39.0–52.0)
Hemoglobin: 15.2 g/dL (ref 13.0–17.0)
MCH: 31.7 pg (ref 26.0–34.0)
MCHC: 36.2 g/dL — ABNORMAL HIGH (ref 30.0–36.0)
MCV: 87.5 fL (ref 80.0–100.0)
Platelets: 176 10*3/uL (ref 150–400)
RBC: 4.8 MIL/uL (ref 4.22–5.81)
RDW: 12.5 % (ref 11.5–15.5)
WBC: 9.4 10*3/uL (ref 4.0–10.5)
nRBC: 0 % (ref 0.0–0.2)

## 2021-02-14 LAB — SURGICAL PCR SCREEN
MRSA, PCR: NEGATIVE
Staphylococcus aureus: NEGATIVE

## 2021-02-14 LAB — GLUCOSE, CAPILLARY
Glucose-Capillary: 313 mg/dL — ABNORMAL HIGH (ref 70–99)
Glucose-Capillary: 311 mg/dL — ABNORMAL HIGH (ref 70–99)
Glucose-Capillary: 272 mg/dL — ABNORMAL HIGH (ref 70–99)
Glucose-Capillary: 199 mg/dL — ABNORMAL HIGH (ref 70–99)

## 2021-02-14 LAB — MAGNESIUM: Magnesium: 2.5 mg/dL — ABNORMAL HIGH (ref 1.7–2.4)

## 2021-02-14 SURGERY — KYPHOPLASTY
Anesthesia: General

## 2021-02-14 MED ORDER — FENTANYL CITRATE (PF) 100 MCG/2ML IJ SOLN
INTRAMUSCULAR | Status: AC
  Filled 2021-02-14: qty 2

## 2021-02-14 MED ORDER — SODIUM CHLORIDE (PF) 0.9 % IJ SOLN
INTRAMUSCULAR | Status: DC | PRN
  Administered 2021-02-14: 20 mL

## 2021-02-14 MED ORDER — 0.9 % SODIUM CHLORIDE (POUR BTL) OPTIME
TOPICAL | Status: DC | PRN
  Administered 2021-02-14: 100 mL

## 2021-02-14 MED ORDER — PROPOFOL 500 MG/50ML IV EMUL
INTRAVENOUS | Status: DC | PRN
  Administered 2021-02-14: 50 ug/kg/min via INTRAVENOUS

## 2021-02-14 MED ORDER — SODIUM CHLORIDE FLUSH 0.9 % IV SOLN
INTRAVENOUS | Status: AC
  Filled 2021-02-14: qty 20

## 2021-02-14 MED ORDER — INSULIN ASPART 100 UNIT/ML IJ SOLN
INTRAMUSCULAR | Status: AC
  Filled 2021-02-14: qty 1

## 2021-02-14 MED ORDER — FENTANYL CITRATE (PF) 100 MCG/2ML IJ SOLN: 25.0000 ug | INTRAMUSCULAR | Status: DC | PRN

## 2021-02-14 MED ORDER — FENTANYL CITRATE (PF) 100 MCG/2ML IJ SOLN
INTRAMUSCULAR | Status: DC | PRN
  Administered 2021-02-14: 50 ug via INTRAVENOUS

## 2021-02-14 MED ORDER — LIDOCAINE HCL 1 % IJ SOLN
INTRAMUSCULAR | Status: DC | PRN
  Administered 2021-02-14: 10 mL

## 2021-02-14 MED ORDER — APIXABAN 5 MG PO TABS: 5.0000 mg | ORAL_TABLET | Freq: Two times a day (BID) | ORAL | Status: DC

## 2021-02-14 MED ORDER — IOHEXOL 180 MG/ML  SOLN
INTRAMUSCULAR | Status: DC | PRN
  Administered 2021-02-14: 10 mL

## 2021-02-14 MED ORDER — LIDOCAINE HCL (PF) 1 % IJ SOLN
INTRAMUSCULAR | Status: AC
  Filled 2021-02-14: qty 60

## 2021-02-14 MED ORDER — PHENYLEPHRINE HCL (PRESSORS) 10 MG/ML IV SOLN
INTRAVENOUS | Status: DC | PRN
  Administered 2021-02-14: 100 ug via INTRAVENOUS

## 2021-02-14 MED ORDER — LIDOCAINE HCL (PF) 1 % IJ SOLN
INTRAMUSCULAR | Status: DC | PRN
  Administered 2021-02-14: 30 mL

## 2021-02-14 MED ORDER — BUPIVACAINE-EPINEPHRINE (PF) 0.5% -1:200000 IJ SOLN
INTRAMUSCULAR | Status: AC
  Filled 2021-02-14: qty 30

## 2021-02-14 MED ORDER — INSULIN ASPART 100 UNIT/ML IJ SOLN
0.0000 [IU] | Freq: Every day | INTRAMUSCULAR | Status: DC
Start: 2021-02-14 — End: 2021-02-17
  Administered 2021-02-15: 3 [IU] via SUBCUTANEOUS
  Filled 2021-02-14: qty 1

## 2021-02-14 MED ORDER — ONDANSETRON HCL 4 MG/2ML IJ SOLN: 4.0000 mg | Freq: Once | INTRAMUSCULAR | Status: DC | PRN

## 2021-02-14 MED ORDER — INSULIN ASPART 100 UNIT/ML IJ SOLN
0.0000 [IU] | Freq: Three times a day (TID) | INTRAMUSCULAR | Status: DC
  Administered 2021-02-14: 8 [IU] via SUBCUTANEOUS
  Administered 2021-02-14: 11 [IU] via SUBCUTANEOUS
  Administered 2021-02-15 (×2): 8 [IU] via SUBCUTANEOUS
  Administered 2021-02-15: 11 [IU] via SUBCUTANEOUS
  Administered 2021-02-16: 5 [IU] via SUBCUTANEOUS
  Administered 2021-02-16: 8 [IU] via SUBCUTANEOUS
  Administered 2021-02-16: 15 [IU] via SUBCUTANEOUS
  Filled 2021-02-14 (×7): qty 1

## 2021-02-14 MED ORDER — MUPIROCIN 2 % EX OINT
1.0000 "application " | TOPICAL_OINTMENT | Freq: Two times a day (BID) | CUTANEOUS | Status: DC
  Filled 2021-02-14: qty 22

## 2021-02-14 MED ORDER — CEFAZOLIN SODIUM-DEXTROSE 2-4 GM/100ML-% IV SOLN: 2.0000 g | Freq: Once | INTRAVENOUS | Status: DC

## 2021-02-14 SURGICAL SUPPLY — 20 items
CEMENT KYPHON CX01A KIT/MIXER (Cement) ×2 IMPLANT
DERMABOND ADVANCED (GAUZE/BANDAGES/DRESSINGS) ×1
DERMABOND ADVANCED .7 DNX12 (GAUZE/BANDAGES/DRESSINGS) ×1 IMPLANT
DEVICE BIOPSY BONE KYPHX (INSTRUMENTS) ×2 IMPLANT
DRAPE C-ARM XRAY 36X54 (DRAPES) ×2 IMPLANT
DURAPREP 26ML APPLICATOR (WOUND CARE) ×2 IMPLANT
GAUZE 4X4 16PLY ~~LOC~~+RFID DBL (SPONGE) ×2 IMPLANT
GLOVE SURG SYN 9.0  PF PI (GLOVE) ×1
GLOVE SURG SYN 9.0 PF PI (GLOVE) ×1 IMPLANT
GOWN SRG 2XL LVL 4 RGLN SLV (GOWNS) ×1 IMPLANT
GOWN STRL NON-REIN 2XL LVL4 (GOWNS) ×1
GOWN STRL REUS W/ TWL LRG LVL3 (GOWN DISPOSABLE) ×1 IMPLANT
GOWN STRL REUS W/TWL LRG LVL3 (GOWN DISPOSABLE) ×1
MANIFOLD NEPTUNE II (INSTRUMENTS) ×2 IMPLANT
PACK KYPHOPLASTY (MISCELLANEOUS) ×2 IMPLANT
RENTAL RFA GENERATOR (MISCELLANEOUS) IMPLANT
STRAP SAFETY 5IN WIDE (MISCELLANEOUS) ×2 IMPLANT
SWABSTK COMLB BENZOIN TINCTURE (MISCELLANEOUS) ×2 IMPLANT
TRAY KYPHOPAK 15/3 EXPRESS 1ST (MISCELLANEOUS) IMPLANT
TRAY KYPHOPAK 20/3 EXPRESS 1ST (MISCELLANEOUS) ×2 IMPLANT

## 2021-02-14 NOTE — Transfer of Care (Signed)
Immediate Anesthesia Transfer of Care Note  Patient: Ernest [name]  Procedure(s) Performed: Anne Ng  Patient Location: PACU  Anesthesia Type:General  Level of Consciousness: drowsy  Airway & Oxygen Therapy: Patient Spontanous Breathing and Patient connected to face mask oxygen  Post-op Assessment: Report given to RN and Post -op Vital signs reviewed and stable  Post vital signs: Reviewed and stable  Last Vitals:  Vitals Value Taken Time  BP 94/54 02/14/21 1627  Temp 36.2 C 02/14/21 1627  Pulse 87 02/14/21 1629  Resp 12 02/14/21 1629  SpO2 99 % 02/14/21 1629  Vitals shown include unvalidated device data.  Last Pain:  Vitals:   02/14/21 1505  TempSrc: Tympanic  PainSc:          Complications: No notable events documented.

## 2021-02-14 NOTE — Consult Note (Signed)
Patient is an 85 year old white male with confusion has been suffering repeated falls.  His wife cares for him at home and he had increasing confusion difficulty walking and agitation.  He was brought to the emergency room and found to have an L1 compression fracture.  He was seen in the emergency room and back brace applied but has been unable to tolerate that.  He is moaning in the shows significant pain with any motion.  He normally is very active walking around with his walker when he remembers it.  On exam he seems to be neurologically intact in lower extremities he cannot follow commands will flex extend the toes and responds to light touch.  He is tender to palpation in the l mid back with no kyphotic deformity noted  CT review shows L1 compression fracture.  This appears acute.  No other fracture noted.  He has a pacemaker and is unable to have MRI.  Impression is painful L1 compression fracture contributing to inability to have him care for himself or allow his wife to care for him at home.  He was admitted for this  Plan is for L1 kyphoplasty.  Had lengthy discussion with his wife showing bone model and brochure regarding the procedure.  Plan on that procedure later today.  We will resume Eliquis tonight.

## 2021-02-14 NOTE — Anesthesia Preprocedure Evaluation (Addendum)
Anesthesia Evaluation  Patient identified by MRN, date of birth, ID band Patient awake    Reviewed: Allergy & Precautions, NPO status , Patient's Chart, lab work & pertinent test results  History of Anesthesia Complications Negative for: history of anesthetic complications  Airway       Comment: Pt not cooperative with exam Dental  (+) Upper Dentures   Pulmonary neg sleep apnea, neg COPD, former smoker,    breath sounds clear to auscultation- rhonchi (-) wheezing      Cardiovascular hypertension, + Peripheral Vascular Disease  (-) CAD, (-) Past MI, (-) Cardiac Stents and (-) CABG + dysrhythmias Atrial Fibrillation + pacemaker  Rhythm:Irregular Rate:Normal - Systolic murmurs and - Diastolic murmurs    Neuro/Psych neg Seizures PSYCHIATRIC DISORDERS Dementia CVA, No Residual Symptoms    GI/Hepatic Neg liver ROS, GERD  ,  Endo/Other  diabetes, Oral Hypoglycemic AgentsHypothyroidism   Renal/GU negative Renal ROS     Musculoskeletal  (+) Arthritis ,   Abdominal (+) - obese,   Peds  Hematology negative hematology ROS (+)   Anesthesia Other Findings Past Medical History: No date: Aortic atherosclerosis (HCC) No date: Arthritis No date: BPH (benign prostatic hypertrophy) No date: Coronary artery disease     Comment:  a. Cath 12/2013->[facility]->Med rx. 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 CHA2DS2VASc = 7-->eliquis;  b. 03/2015 Echo: EF               55-60%, Gr1 DD, mild MR, mildly dil LA, PASP 16mHg. No date: Pneumonia     Comment:  as a child 08/16/2015: Presence  of permanent cardiac pacemaker No date: PVD (peripheral vascular disease) (HCC) No date: Rosacea No date: Stroke ([facility] No date: Thyroid disease No date: Urine frequency   Reproductive/Obstetrics                            Anesthesia Physical Anesthesia Plan  ASA: 3  Anesthesia Plan: General   Post-op Pain Management:    Induction: Intravenous  PONV Risk Score and Plan: 1 and Propofol infusion  Airway Management Planned: Natural Airway  Additional Equipment:   Intra-op Plan:   Post-operative Plan:   Informed Consent: I have reviewed the patients History and Physical, chart, labs and discussed the procedure including the risks, benefits and alternatives for the proposed anesthesia with the patient or authorized representative who has indicated his/her understanding and acceptance.   Patient has DNR.  Discussed DNR with power of attorney and Suspend DNR.   Dental advisory given and Consent reviewed with POA (consent obtained over the phone from pt's wife)  Plan Discussed with: CRNA and Anesthesiologist  Anesthesia Plan Comments:         Anesthesia Quick Evaluation

## 2021-02-14 NOTE — Plan of Care (Signed)
No acute events during the night. VSS. Patient showing signs of aggression when staff attempted to assess and provide peri care. NPO pending Kyphoplasty today. Random FSBS obtained and was 313. Safety maintained. Bed low. Wheels locked. Patient unable to use call light.  Problem: Education: Goal: Knowledge of General Education information will improve Description: Including pain rating scale, medication(s)/side effects and non-pharmacologic comfort measures Outcome: Progressing   Problem: Health Behavior/Discharge Planning: Goal: Ability to manage health-related needs will improve Outcome: Progressing   Problem: Clinical Measurements: Goal: Ability to maintain clinical measurements within normal limits will improve Outcome: Progressing Goal: Will remain free from infection Outcome: Progressing Goal: Diagnostic test results will improve Outcome: Progressing Goal: Respiratory complications will improve Outcome: Progressing Goal: Cardiovascular complication will be avoided Outcome: Progressing   Problem: Activity: Goal: Risk for activity intolerance will decrease Outcome: Progressing   Problem: Nutrition: Goal: Adequate nutrition will be maintained Outcome: Progressing   Problem: Coping: Goal: Level of anxiety will decrease Outcome: Progressing   Problem: Elimination: Goal: Will not experience complications related to bowel motility Outcome: Progressing Goal: Will not experience complications related to urinary retention Outcome: Progressing   Problem: Pain Managment: Goal: General experience of comfort will improve Outcome: Progressing   Problem: Safety: Goal: Ability to remain free from injury will improve Outcome: Progressing   Problem: Skin Integrity: Goal: Risk for impaired skin integrity will decrease Outcome: Progressing   Problem: Clinical Measurements: Goal: Remain free from any harm during hospitalization Outcome: Progressing   Problem: Nutrition: Goal:  Adequate fluids and nutrition will be maintained Outcome: Progressing   Problem: Coping: Goal: Ability to disclose and discuss thoughts of suicide and self-harm will improve Outcome: Progressing   Problem: Medication Management: Goal: Adhere to prescribed medication regimen Outcome: Progressing   Problem: Sleep Hygiene: Goal: Ability to obtain adequate restful sleep will improve Outcome: Progressing

## 2021-02-14 NOTE — Plan of Care (Signed)
  Problem: Education: Goal: Knowledge of General Education information will improve Description: Including pain rating scale, medication(s)/side effects and non-pharmacologic comfort measures Outcome: Progressing   Problem: Health Behavior/Discharge Planning: Goal: Ability to manage health-related needs will improve Outcome: Progressing   Problem: Clinical Measurements: Goal: Ability to maintain clinical measurements within normal limits will improve Outcome: Progressing Goal: Will remain free from infection Outcome: Progressing Goal: Diagnostic test results will improve Outcome: Progressing Goal: Respiratory complications will improve Outcome: Progressing Goal: Cardiovascular complication will be avoided Outcome: Progressing   Problem: Activity: Goal: Risk for activity intolerance will decrease Outcome: Progressing   Problem: Nutrition: Goal: Adequate nutrition will be maintained Outcome: Progressing   Problem: Coping: Goal: Level of anxiety will decrease Outcome: Progressing   Problem: Elimination: Goal: Will not experience complications related to bowel motility Outcome: Progressing Goal: Will not experience complications related to urinary retention Outcome: Progressing   Problem: Pain Managment: Goal: General experience of comfort will improve Outcome: Progressing   Problem: Safety: Goal: Ability to remain free from injury will improve Outcome: Progressing   Problem: Skin Integrity: Goal: Risk for impaired skin integrity will decrease Outcome: Progressing   Problem: Clinical Measurements: Goal: Remain free from any harm during hospitalization Outcome: Progressing   Problem: Nutrition: Goal: Adequate fluids and nutrition will be maintained Outcome: Progressing   Problem: Coping: Goal: Ability to disclose and discuss thoughts of suicide and self-harm will improve Outcome: Progressing   Problem: Medication Management: Goal: Adhere to prescribed  medication regimen Outcome: Progressing   Problem: Sleep Hygiene: Goal: Ability to obtain adequate restful sleep will improve Outcome: Progressing

## 2021-02-14 NOTE — Progress Notes (Signed)
Pt returned from surgery in stable condition.  Vs taken.

## 2021-02-14 NOTE — Progress Notes (Signed)
PROGRESS NOTE    Ernest [name]  [id number] DOB: February 09, 1936 DOA: 02/11/2021 PCP: Ernest Late, MD  ARPO/None   Assessment & Plan:   Principal Problem:   Compression fracture of lumbar vertebra ([place]) Active Problems:   Type 2 diabetes mellitus (HCC)   PAF (paroxysmal atrial fibrillation) (HCC)   Coronary artery disease   Essential hypertension   Dementia with behavioral disturbance (HCC)   Falls   Hypothyroidism   Compression fracture of lumbosacral spine (HCC)   Ernest [name] is a 85 y.o. male with medical history significant for Paroxysmal A. fib on Eliquis, dementia, BPH, hypothyroidism, type 2 diabetes and hypertension who was brought to the ED by EMS for evaluation of low back pain and worsening confusion. Patient's wife notes that he fell about a week ago and was able to get back up with assistance from family.  The night prior to his admission he fell again landing on his right side and was unable to get up.  His nephew was able to get him up and put him in bed but patient started complaining of pain in his lower back and inability to get up.  She also notes that he has become increasingly confused and agitated and is difficult to redirect.  She is his only caregiver and states that his worsening condition has taken a toll on her health.   compression fracture of lumbar vertebrae Pain control with scheduled Tylenol, Lidoderm patch and as needed oxycodone --kyphoplasty today   Dementia with behavioral disturbances --on home Namenda, Seroquel and Zoloft --cont home regimen --cont mittens, sitter if pt is a danger to himself or others   Hypothyroidism --cont Syndhroid   History of paroxysmal atrial fibrillation --cont cardizem --hold Eliquis    BPH Continue Flomax  Frequent falls --PT/OT after kyphoplasty    DVT prophylaxis: Lovenox SQ Code Status: DNR  Family Communication: wife updated at bedside Level of care: Med-Surg Dispo:   The patient is  from: home Anticipated d/c is to: undetermined Anticipated d/c date is: undetermined Patient currently is not medically ready to d/c due to: pending kyphoplasty   Subjective and Interval History:  Pt remained confused, and swiping at at anyone touching him.    Going for Kyphoplasty this afternoon.   Objective: Vitals:   02/14/21 0546 02/14/21 0749 02/14/21 1141 02/14/21 1505  BP: 116/60 (!) 121/93 117/88 133/73  Pulse: 71 76 86 82  Resp: '20 18 16 16  '$ Temp: 98.3 F (36.8 C) 98.2 F (36.8 C) 97.6 F (36.4 C) 97.6 F (36.4 C)  TempSrc:    Tympanic  SpO2: 95% 93% 90% 98%  Weight:    71.7 kg  Height:    '5\' 7"'$  (1.702 m)   No intake or output data in the 24 hours ending 02/14/21 1536  Filed Weights   02/11/21 1504 02/11/21 1947 02/14/21 1505  Weight: 74.8 kg 71.7 kg 71.7 kg    Examination:   Constitutional: NAD, alert, not oriented HEENT: conjunctivae and lids normal, EOMI CV: No cyanosis.   RESP: normal respiratory effort, on RA Extremities: No effusions, edema in BLE SKIN: warm, dry   Data Reviewed: I have personally reviewed following labs and imaging studies  CBC: Recent Labs  Lab 02/11/21 0826 02/13/21 0509 02/14/21 0442  WBC 10.1 8.8 9.4  NEUTROABS 6.8  --   --   HGB 14.5 15.6 15.2  HCT 40.6 44.4 42.0  MCV 84.8 86.5 87.5  PLT 189 178 0000000   Basic Metabolic Panel:  Recent Labs  Lab 02/11/21 0826 02/13/21 0509 02/14/21 0442  NA 136 139 137  K 4.4 4.8 4.4  CL 103 106 106  CO2 '25 29 26  '$ GLUCOSE 212* 246* 335*  BUN 20 19 26*  CREATININE 0.67 0.77 0.88  CALCIUM 10.2 10.3 10.6*  MG  --  2.2 2.5*   GFR: Estimated Creatinine Clearance: 60.5 mL/min (by C-G formula based on SCr of 0.88 mg/dL). Liver Function Tests: Recent Labs  Lab 02/11/21 0826  AST 21  ALT 16  ALKPHOS 94  BILITOT 1.1  PROT 7.0  ALBUMIN 3.8   No results for input(s): LIPASE, AMYLASE in the last 168 hours. No results for input(s): AMMONIA in the last 168 hours. Coagulation  Profile: Recent Labs  Lab 02/11/21 0826  INR 1.0   Cardiac Enzymes: Recent Labs  Lab 02/11/21 0826  CKTOTAL 17*   BNP (last 3 results) No results for input(s): PROBNP in the last 8760 hours. HbA1C: No results for input(s): HGBA1C in the last 72 hours. CBG: Recent Labs  Lab 02/14/21 0554 02/14/21 1206  GLUCAP 313* 311*   Lipid Profile: No results for input(s): CHOL, HDL, LDLCALC, TRIG, CHOLHDL, LDLDIRECT in the last 72 hours. Thyroid Function Tests: No results for input(s): TSH, T4TOTAL, FREET4, T3FREE, THYROIDAB in the last 72 hours. Anemia Panel: No results for input(s): VITAMINB12, FOLATE, FERRITIN, TIBC, IRON, RETICCTPCT in the last 72 hours. Sepsis Labs: Recent Labs  Lab 02/11/21 0958 02/11/21 1501  LATICACIDVEN 1.2 1.1    Recent Results (from the past 240 hour(s))  Resp Panel by RT-PCR (Flu A&B, Covid)     Status: None   Collection Time: 02/12/21  1:50 AM  Result Value Ref Range Status   SARS Coronavirus 2 by RT PCR NEGATIVE NEGATIVE Final    Comment: (NOTE) SARS-CoV-2 target nucleic acids are NOT DETECTED.  The SARS-CoV-2 RNA is generally detectable in upper respiratory specimens during the acute phase of infection. The lowest concentration of SARS-CoV-2 viral copies this assay can detect is 138 copies/mL. A negative result does not preclude SARS-Cov-2 infection and should not be used as the sole basis for treatment or other patient management decisions. A negative result may occur with  improper specimen collection/handling, submission of specimen other than nasopharyngeal swab, presence of viral mutation(s) within the areas targeted by this assay, and inadequate number of viral copies(<138 copies/mL). A negative result must be combined with clinical observations, patient history, and epidemiological information. The expected result is Negative.  Fact Sheet for Patients:  EntrepreneurPulse.com.au  Fact Sheet for Healthcare  Providers:  IncredibleEmployment.be  This test is no t yet approved or cleared by the [place] FDA and  has been authorized for detection and/or diagnosis of SARS-CoV-2 by FDA under an Emergency Use Authorization (EUA). This EUA will remain  in effect (meaning this test can be used) for the duration of the COVID-19 declaration under Section 564(b)(1) of the Act, 21 U.S.C.section 360bbb-3(b)(1), unless the authorization is terminated  or revoked sooner.       Influenza A by PCR NEGATIVE NEGATIVE Final   Influenza B by PCR NEGATIVE NEGATIVE Final    Comment: (NOTE) The Xpert Xpress SARS-CoV-2/FLU/RSV plus assay is intended as an aid in the diagnosis of influenza from Nasopharyngeal swab specimens and should not be used as a sole basis for treatment. Nasal washings and aspirates are unacceptable for Xpert Xpress SARS-CoV-2/FLU/RSV testing.  Fact Sheet for Patients: EntrepreneurPulse.com.au  Fact Sheet for Healthcare Providers: IncredibleEmployment.be  This test is  not yet approved or cleared by the [place] and has been authorized for detection and/or diagnosis of SARS-CoV-2 by FDA under an Emergency Use Authorization (EUA). This EUA will remain in effect (meaning this test can be used) for the duration of the COVID-19 declaration under Section 564(b)(1) of the Act, 21 U.S.C. section 360bbb-3(b)(1), unless the authorization is terminated or revoked.  Performed at [facility], [address] [place], [place] [address]   Surgical PCR screen     Status: None   Collection Time: 02/14/21  3:01 AM   Specimen: Nasal Mucosa; Nasal Swab  Result Value Ref Range Status   MRSA, PCR NEGATIVE NEGATIVE Final   Staphylococcus aureus NEGATIVE NEGATIVE Final    Comment: (NOTE) The Xpert SA Assay (FDA approved for NASAL specimens in patients 45 years of age and older), is one component of a  comprehensive surveillance program. It is not intended to diagnose infection nor to guide or monitor treatment. Performed at [facility], [address], [place], [place] [address]       Radiology Studies: No results found.   Scheduled Meds:  [MAR Hold] acetaminophen  1,000 mg Oral TID   [MAR Hold] atorvastatin  40 mg Oral Daily   [MAR Hold] diltiazem  120 mg Oral Daily   [MAR Hold] insulin aspart  0-15 Units Subcutaneous TID WC   [MAR Hold] insulin aspart  0-5 Units Subcutaneous QHS   [MAR Hold] levothyroxine  125 mcg Oral QAC breakfast   [MAR Hold] lidocaine  1 patch Transdermal Q24H   [MAR Hold] memantine  5 mg Oral BID   [MAR Hold] multivitamin with minerals  1 tablet Oral Daily   [MAR Hold] polyethylene glycol  17 g Oral Daily   [MAR Hold] QUEtiapine  50 mg Oral QHS   [MAR Hold] sertraline  25 mg Oral Daily   [MAR Hold] sodium chloride flush  3 mL Intravenous Q12H   [MAR Hold] tamsulosin  0.4 mg Oral BID   Continuous Infusions:  [MAR Hold] sodium chloride     [MAR Hold]  ceFAZolin (ANCEF) IV       LOS: 2 days     Enzo Bi, MD [facility] If 7PM-7AM, please contact night-coverage 02/14/2021, 3:36 PM

## 2021-02-14 NOTE — Anesthesia Postprocedure Evaluation (Signed)
Anesthesia Post Note  Patient: Ernest [name]  Procedure(s) Performed: [facility]  Patient location during evaluation: PACU Anesthesia Type: General Level of consciousness: awake and alert Pain management: pain level controlled Vital Signs Assessment: post-procedure vital signs reviewed and stable Respiratory status: spontaneous breathing, nonlabored ventilation, respiratory function stable and patient connected to nasal cannula oxygen Cardiovascular status: blood pressure returned to baseline and stable Postop Assessment: no apparent nausea or vomiting Anesthetic complications: no   No notable events documented.   Last Vitals:  Vitals:   02/14/21 1726 02/14/21 1750  BP: (!) 124/97 128/80  Pulse: 81 78  Resp: 17 16  Temp:  36.7 C  SpO2: 95% 96%    Last Pain:  Vitals:   02/14/21 1700  TempSrc:   PainSc: 0-No pain                 Precious Haws Paislea Hatton

## 2021-02-14 NOTE — Op Note (Signed)
02/14/2021  4:24 PM  PATIENT:  Ernest [name]   MRN: [id number]   PRE-OPERATIVE DIAGNOSIS:  closed wedge compression fracture of L1   POST-OPERATIVE DIAGNOSIS:  closed wedge compression fracture of L1   PROCEDURE:  Procedure(s): KYPHOPLASTY L1  SURGEON: Laurene Footman, MD   ASSISTANTS: None   ANESTHESIA:   local and MAC   EBL:  No intake/output data recorded.   BLOOD ADMINISTERED:none   DRAINS: none    LOCAL MEDICATIONS USED:  MARCAINE    and XYLOCAINE    SPECIMEN: None   DISPOSITION OF SPECIMEN: Not applicable   COUNTS:  YES   TOURNIQUET:  * No tourniquets in log *   IMPLANTS: Bone cement   DICTATION: .Dragon Dictation  patient was brought to the operating room and after adequate anesthesia was obtained the patient was placed prone.  C arm was brought in in good visualization of the affected level obtained on both AP and lateral projections.  After patient identification and timeout procedures were completed, local anesthetic was infiltrated with 10 cc 1% Xylocaine infiltrated subcutaneously.  This is done the area on the each side of the planned approach.  The back was then prepped and draped in the usual sterile manner and repeat timeout procedure carried out.  A spinal needle was brought down to the pedicle on the right side of L1 and a 50-50 mix of 1% Xylocaine half percent Sensorcaine with epinephrine total of 20 cc injected on the right side.  After allowing this to set a small incision was made and the trocar was advanced into the vertebral body in an extrapedicular fashion.  Biopsy was not obtained as the bone was very soft.  Drilling was carried out balloon inserted with inflation to 4-1/2 cc on the right and across the midline so left-sided stick was not required.  When the cement was appropriate consistency 6 cc were injected on the rightt into the vertebral body without extravasation, good fill superior to inferior endplates and from right to left sides along the  inferior endplate.  After the cement had set the trochar was removed and permanent C-arm views obtained.  The wound was closed with Dermabond followed by Band-Aid   PLAN OF CARE: Continue as inpatient   PATIENT DISPOSITION:  PACU - hemodynamically stable.

## 2021-02-15 ENCOUNTER — Encounter: Payer: Self-pay | Admitting: Orthopedic Surgery

## 2021-02-15 DIAGNOSIS — Z7189 Other specified counseling: Secondary | ICD-10-CM

## 2021-02-15 LAB — GLUCOSE, CAPILLARY
Glucose-Capillary: 259 mg/dL — ABNORMAL HIGH (ref 70–99)
Glucose-Capillary: 309 mg/dL — ABNORMAL HIGH (ref 70–99)
Glucose-Capillary: 296 mg/dL — ABNORMAL HIGH (ref 70–99)
Glucose-Capillary: 353 mg/dL — ABNORMAL HIGH (ref 70–99)

## 2021-02-15 LAB — CBC
HCT: 44.6 % (ref 39.0–52.0)
Hemoglobin: 15.5 g/dL (ref 13.0–17.0)
MCH: 30.1 pg (ref 26.0–34.0)
MCHC: 34.8 g/dL (ref 30.0–36.0)
MCV: 86.6 fL (ref 80.0–100.0)
Platelets: 191 10*3/uL (ref 150–400)
RBC: 5.15 MIL/uL (ref 4.22–5.81)
RDW: 12.4 % (ref 11.5–15.5)
WBC: 10.7 10*3/uL — ABNORMAL HIGH (ref 4.0–10.5)
nRBC: 0 % (ref 0.0–0.2)

## 2021-02-15 LAB — MAGNESIUM: Magnesium: 2 mg/dL (ref 1.7–2.4)

## 2021-02-15 LAB — BASIC METABOLIC PANEL
Anion gap: 7 (ref 5–15)
BUN: 35 mg/dL — ABNORMAL HIGH (ref 8–23)
CO2: 25 mmol/L (ref 22–32)
Calcium: 10.1 mg/dL (ref 8.9–10.3)
Chloride: 106 mmol/L (ref 98–111)
Creatinine, Ser: 0.77 mg/dL (ref 0.61–1.24)
GFR, Estimated: >60 mL/min (ref >60)
Glucose, Bld: 240 mg/dL — ABNORMAL HIGH (ref 70–99)
Potassium: 4.5 mmol/L (ref 3.5–5.1)
Sodium: 138 mmol/L (ref 135–145)

## 2021-02-15 MED ORDER — TRAZODONE HCL 100 MG PO TABS
100.0000 mg | ORAL_TABLET | Freq: Every day | ORAL | Status: DC
  Administered 2021-02-15: 100 mg via ORAL
  Filled 2021-02-15: qty 1

## 2021-02-15 NOTE — Progress Notes (Cosign Needed Addendum)
Patient has Dementia and Compression fracture of the spine which requires upper and lower body to be positioned in ways not feasible with a normal bed. Head must be elevated at least 30 degrees or the patient has additional pain.  Dementia requires frequent changes in body position which cannot be achieved with a normal bed.

## 2021-02-15 NOTE — Progress Notes (Signed)
PROGRESS NOTE    Ernest [name]  [id number] DOB: 04-Jun-1936 DOA: 02/11/2021 PCP: Derinda Late, MD  150A/150A-AA   Assessment & Plan:   Principal Problem:   Compression fracture of lumbar vertebra ([place]) Active Problems:   Type 2 diabetes mellitus (HCC)   PAF (paroxysmal atrial fibrillation) (HCC)   Coronary artery disease   Essential hypertension   Dementia with behavioral disturbance (HCC)   Falls   Hypothyroidism   Compression fracture of lumbosacral spine (HCC)   Ernest [name] is a 85 y.o. male with medical history significant for Paroxysmal A. fib on Eliquis, dementia, BPH, hypothyroidism, type 2 diabetes and hypertension who was brought to the ED by EMS for evaluation of low back pain and worsening confusion. Patient's wife notes that he fell about a week ago and was able to get back up with assistance from family.  The night prior to his admission he fell again landing on his right side and was unable to get up.  His nephew was able to get him up and put him in bed but patient started complaining of pain in his lower back and inability to get up.  She also notes that he has become increasingly confused and agitated and is difficult to redirect.  She is his only caregiver and states that his worsening condition has taken a toll on her health.   compression fracture of lumbar vertebrae Pain control with scheduled Tylenol, Lidoderm patch and as needed oxycodone --kyphoplasty on 8/8 with apparent improvement in pain.   Dementia with behavioral disturbances --on home Namenda, Seroquel and Zoloft --cont home regimen --cont mittens, sitter if pt is a danger to himself or others --add trazodone nightly to help with night-time agitation.   Hypothyroidism --cont Syndhroid   History of paroxysmal atrial fibrillation --cont cardizem --hold Eliquis    BPH Continue Flomax  Frequent falls --HH PT/OT not ordered because pt can't follow commands    DVT prophylaxis:  Lovenox SQ Code Status: DNR  Family Communication: wife updated at bedside Level of care: Med-Surg Dispo:   The patient is from: home Anticipated d/c is to: home with hospice Anticipated d/c date is: tomorrow Patient currently is medically ready to d/c.   Subjective and Interval History:  Kyphoplasty yesterday, which wife believed improved pt's back pain because pt has been moving around a lot more in bed.    Palliative care consulted.  Pt is not rehab-dable.  Wife decided to take pt home with hospice.     Objective: Vitals:   02/15/21 0412 02/15/21 0748 02/15/21 1140 02/15/21 1654  BP: (!) 153/99 116/84 (!) 109/56 (!) 122/99  Pulse: 97 97 90 76  Resp:  '19 20 16  '$ Temp: 98.1 F (36.7 C) 98.4 F (36.9 C) 97.7 F (36.5 C) 98.5 F (36.9 C)  TempSrc:  Oral Oral   SpO2: 98% 99% 97% 100%  Weight:      Height:        Intake/Output Summary (Last 24 hours) at 02/15/2021 1717 Last data filed at 02/15/2021 1337 Gross per 24 hour  Intake 400 ml  Output --  Net 400 ml    Filed Weights   02/11/21 1504 02/11/21 1947 02/14/21 1505  Weight: 74.8 kg 71.7 kg 71.7 kg    Examination:   Constitutional: NAD, not oriented, sleeping but arousable, legs hanging out of bed, moving about a lot in bed CV: No cyanosis.   RESP: normal respiratory effort, on RA Extremities: No effusions, edema in BLE SKIN:  warm, dry   Data Reviewed: I have personally reviewed following labs and imaging studies  CBC: Recent Labs  Lab 02/11/21 0826 02/13/21 0509 02/14/21 0442 02/15/21 0355  WBC 10.1 8.8 9.4 10.7*  NEUTROABS 6.8  --   --   --   HGB 14.5 15.6 15.2 15.5  HCT 40.6 44.4 42.0 44.6  MCV 84.8 86.5 87.5 86.6  PLT 189 178 176 [id number]   Basic Metabolic Panel: Recent Labs  Lab 02/11/21 0826 02/13/21 0509 02/14/21 0442 02/15/21 0355  NA 136 139 137 138  K 4.4 4.8 4.4 4.5  CL 103 106 106 106  CO2 '25 29 26 25  '$ GLUCOSE 212* 246* 335* 240*  BUN 20 19 26* 35*  CREATININE 0.67 0.77 0.88 0.77   CALCIUM 10.2 10.3 10.6* 10.1  MG  --  2.2 2.5* 2.0   GFR: Estimated Creatinine Clearance: 66.6 mL/min (by C-G formula based on SCr of 0.77 mg/dL). Liver Function Tests: Recent Labs  Lab 02/11/21 0826  AST 21  ALT 16  ALKPHOS 94  BILITOT 1.1  PROT 7.0  ALBUMIN 3.8   No results for input(s): LIPASE, AMYLASE in the last 168 hours. No results for input(s): AMMONIA in the last 168 hours. Coagulation Profile: Recent Labs  Lab 02/11/21 0826  INR 1.0   Cardiac Enzymes: Recent Labs  Lab 02/11/21 0826  CKTOTAL 17*   BNP (last 3 results) No results for input(s): PROBNP in the last 8760 hours. HbA1C: No results for input(s): HGBA1C in the last 72 hours. CBG: Recent Labs  Lab 02/14/21 1634 02/14/21 2110 02/15/21 0744 02/15/21 1143 02/15/21 1652  GLUCAP 272* 199* 259* 309* 296*   Lipid Profile: No results for input(s): CHOL, HDL, LDLCALC, TRIG, CHOLHDL, LDLDIRECT in the last 72 hours. Thyroid Function Tests: No results for input(s): TSH, T4TOTAL, FREET4, T3FREE, THYROIDAB in the last 72 hours. Anemia Panel: No results for input(s): VITAMINB12, FOLATE, FERRITIN, TIBC, IRON, RETICCTPCT in the last 72 hours. Sepsis Labs: Recent Labs  Lab 02/11/21 0958 02/11/21 1501  LATICACIDVEN 1.2 1.1    Recent Results (from the past 240 hour(s))  Resp Panel by RT-PCR (Flu A&B, Covid)     Status: None   Collection Time: 02/12/21  1:50 AM  Result Value Ref Range Status   SARS Coronavirus 2 by RT PCR NEGATIVE NEGATIVE Final    Comment: (NOTE) SARS-CoV-2 target nucleic acids are NOT DETECTED.  The SARS-CoV-2 RNA is generally detectable in upper respiratory specimens during the acute phase of infection. The lowest concentration of SARS-CoV-2 viral copies this assay can detect is 138 copies/mL. A negative result does not preclude SARS-Cov-2 infection and should not be used as the sole basis for treatment or other patient management decisions. A negative result may occur with   improper specimen collection/handling, submission of specimen other than nasopharyngeal swab, presence of viral mutation(s) within the areas targeted by this assay, and inadequate number of viral copies(<138 copies/mL). A negative result must be combined with clinical observations, patient history, and epidemiological information. The expected result is Negative.  Fact Sheet for Patients:  EntrepreneurPulse.com.au  Fact Sheet for Healthcare Providers:  IncredibleEmployment.be  This test is no t yet approved or cleared by the [place] FDA and  has been authorized for detection and/or diagnosis of SARS-CoV-2 by FDA under an Emergency Use Authorization (EUA). This EUA will remain  in effect (meaning this test can be used) for the duration of the COVID-19 declaration under Section 564(b)(1) of the Act, 21 U.S.C.section  360bbb-3(b)(1), unless the authorization is terminated  or revoked sooner.       Influenza A by PCR NEGATIVE NEGATIVE Final   Influenza B by PCR NEGATIVE NEGATIVE Final    Comment: (NOTE) The Xpert Xpress SARS-CoV-2/FLU/RSV plus assay is intended as an aid in the diagnosis of influenza from Nasopharyngeal swab specimens and should not be used as a sole basis for treatment. Nasal washings and aspirates are unacceptable for Xpert Xpress SARS-CoV-2/FLU/RSV testing.  Fact Sheet for Patients: EntrepreneurPulse.com.au  Fact Sheet for Healthcare Providers: IncredibleEmployment.be  This test is not yet approved or cleared by the [place] FDA and has been authorized for detection and/or diagnosis of SARS-CoV-2 by FDA under an Emergency Use Authorization (EUA). This EUA will remain in effect (meaning this test can be used) for the duration of the COVID-19 declaration under Section 564(b)(1) of the Act, 21 U.S.C. section 360bbb-3(b)(1), unless the authorization is terminated  or revoked.  Performed at [facility], [address] [place], [place] [address]   Surgical PCR screen     Status: None   Collection Time: 02/14/21  3:01 AM   Specimen: Nasal Mucosa; Nasal Swab  Result Value Ref Range Status   MRSA, PCR NEGATIVE NEGATIVE Final   Staphylococcus aureus NEGATIVE NEGATIVE Final    Comment: (NOTE) The Xpert SA Assay (FDA approved for NASAL specimens in patients 23 years of age and older), is one component of a comprehensive surveillance program. It is not intended to diagnose infection nor to guide or monitor treatment. Performed at [facility], [address] [place], [place] [address]       Radiology Studies: DG Lumbar Spine 2-3 Views  Result Date: 02/14/2021 CLINICAL DATA:  L1 kyphoplasty. EXAM: LUMBAR SPINE - 2-3 VIEW; DG C-ARM 1-60 MIN COMPARISON:  Reformats from abdominopelvic CT 02/11/2021 FINDINGS: Two fluoroscopic spot views of the lumbar spine obtained in the operating room in frontal and lateral projections. Kyphoplasty material within L1 vertebra. Total fluoroscopy time 45 seconds. IMPRESSION: Fluoroscopic spot views for L1 kyphoplasty. Electronically Signed   By: Keith Rake M.D.   On: 02/14/2021 16:40   DG C-Arm 1-60 Min  Result Date: 02/14/2021 CLINICAL DATA:  L1 kyphoplasty. EXAM: LUMBAR SPINE - 2-3 VIEW; DG C-ARM 1-60 MIN COMPARISON:  Reformats from abdominopelvic CT 02/11/2021 FINDINGS: Two fluoroscopic spot views of the lumbar spine obtained in the operating room in frontal and lateral projections. Kyphoplasty material within L1 vertebra. Total fluoroscopy time 45 seconds. IMPRESSION: Fluoroscopic spot views for L1 kyphoplasty. Electronically Signed   By: Keith Rake M.D.   On: 02/14/2021 16:40     Scheduled Meds:  acetaminophen  1,000 mg Oral TID   [START ON 02/16/2021] apixaban  5 mg Oral BID   atorvastatin  40 mg Oral Daily   diltiazem  120 mg Oral Daily   insulin aspart  0-15 Units Subcutaneous  TID WC   insulin aspart  0-5 Units Subcutaneous QHS   levothyroxine  125 mcg Oral QAC breakfast   lidocaine  1 patch Transdermal Q24H   memantine  5 mg Oral BID   multivitamin with minerals  1 tablet Oral Daily   polyethylene glycol  17 g Oral Daily   QUEtiapine  50 mg Oral QHS   sertraline  25 mg Oral Daily   sodium chloride flush  3 mL Intravenous Q12H   tamsulosin  0.4 mg Oral BID   traZODone  100 mg Oral QHS   Continuous Infusions:  sodium chloride  ceFAZolin (ANCEF) IV       LOS: 3 days     Enzo Bi, MD [facility] If 7PM-7AM, please contact night-coverage 02/15/2021, 5:17 PM

## 2021-02-15 NOTE — Consult Note (Signed)
Consultation Note Date: 02/15/2021   Patient Name: Ernest [name]  DOB: [birth year]  MRN: [id number]  Age / Sex: 85 y.o., male  PCP: Derinda Late, MD Referring Physician: Enzo Bi, MD  Reason for Consultation: Establishing goals of care  HPI/Patient Profile: Ernest [name] is a 85 y.o. male with medical history significant for Paroxysmal A. fib on Eliquis, dementia, BPH, hypothyroidism, type 2 diabetes and hypertension who was brought to the ED by EMS for evaluation of low back pain and worsening confusion.  Clinical Assessment and Goals of Care: Patient is resting in bed. He is restless and agitated. Wife is at bedside. Wife states they have no children.  She states prior to this admission her husband had been declining rapidly. She states she assists with all ADL's. She states what he says does not make sense. She states he has been talking to people who are dead for months. She discusses multiple falls.    We discussed his diagnosis, prognosis, GOC, EOL wishes disposition and options.  Created space and opportunity for patient  to explore thoughts and feelings regarding current medical information.   A detailed discussion was had today regarding advanced directives.  Concepts specific to code status, artifical feeding and hydration, IV antibiotics and rehospitalization were discussed.  The difference between an aggressive medical intervention path and a comfort care path was discussed.  Values and goals of care important to patient and family were attempted to be elicited.  Discussed limitations of medical interventions to prolong quality of life in some situations and discussed the concept of human mortality.  Wife states she would like to take him home with hospice to follow; he is currently followed by [facility]. She states she would like to focus on his comfort and dignity until  death. She states she does not want any further life prolonging care or return to the hospital.  I completed a MOST form today with wife and the signed original was placed in the chart. A photocopy was placed in the chart to be scanned into EMR. The patient outlined their wishes for the following treatment decisions:  Cardiopulmonary Resuscitation: Do Not Attempt Resuscitation (DNR/No CPR)  Medical Interventions: Comfort Measures: Keep clean, warm, and dry. Use medication by any route, positioning, wound care, and other measures to relieve pain and suffering. Use oxygen, suction and manual treatment of airway obstruction as needed for comfort. Do not transfer to the hospital unless comfort needs cannot be met in current location.  Antibiotics: No antibiotics (use other measures to relieve symptoms)  IV Fluids: No IV fluids (provide other measures to ensure comfort)  Feeding Tube: No feeding tube        [place] with hospice- currently followed by [facility].   He is on nightly Seroquel. Would recommend initiating Trazadone to help with agitation.   Prognosis:  < 6 months Advanced dementia. Back fracture with surgery.        Primary Diagnoses: Present on Admission:  Compression fracture of  lumbar vertebra (HCC)  Coronary artery disease  Dementia with behavioral disturbance (HCC)  Essential hypertension  PAF (paroxysmal atrial fibrillation) ([place])  Falls  Hypothyroidism  Compression fracture of lumbosacral spine ([place])   I have reviewed the medical record, interviewed the patient and family, and examined the patient. The following aspects are pertinent.  Past Medical History:  Diagnosis Date   Aortic atherosclerosis (HCC)    Arthritis    BPH (benign prostatic hypertrophy)    Coronary artery disease    a. Cath 12/2013->[facility]->Med rx.   Deviated nasal septum    Diabetes mellitus without complication (HCC)    type 2   Essential  hypertension    GERD (gastroesophageal reflux disease)    Hearing deficit    wears hearing aids   Hyperlipidemia    Lupus (HCC)    discoid of scalp only   PAF (paroxysmal atrial fibrillation) ([place])    a. CHA2DS2VASc = 7-->eliquis;  b. 03/2015 Echo: EF 55-60%, Gr1 DD, mild MR, mildly dil LA, PASP 42mHg.   Pneumonia    as a child   Presence of permanent cardiac pacemaker 08/16/2015   PVD (peripheral vascular disease) ([place]    Rosacea    Stroke ([place]    Thyroid disease    Urine frequency    Social History   Socioeconomic History   Marital status: Married    Spouse name: Not on file   Number of children: Not on file   Years of education: Not on file   Highest education level: Not on file  Occupational History   Not on file  Tobacco Use   Smoking status: Former    Types: Cigarettes   Smokeless tobacco: Never   Tobacco comments:    " Quit smoking cigarettes in 1983 "  Vaping Use   Vaping Use: Former  Substance and Sexual Activity   Alcohol use: No   Drug use: No   Sexual activity: Not on file  Other Topics Concern   Not on file  Social History Narrative   Not on file   Social Determinants of Health   Financial Resource Strain: Not on file  Food Insecurity: Not on file  Transportation Needs: Not on file  Physical Activity: Not on file  Stress: Not on file  Social Connections: Not on file   Family History  Problem Relation Age of Onset   Heart attack Mother    Heart attack Father    Cancer - Lung Sister    Cancer - Lung Brother    Cancer - Lung Brother    Cancer Brother    Diabetes Sister    Scheduled Meds:  acetaminophen  1,000 mg Oral TID   [START ON 02/16/2021] apixaban  5 mg Oral BID   atorvastatin  40 mg Oral Daily   diltiazem  120 mg Oral Daily   insulin aspart  0-15 Units Subcutaneous TID WC   insulin aspart  0-5 Units Subcutaneous QHS   levothyroxine  125 mcg Oral QAC breakfast   lidocaine  1 patch Transdermal Q24H   memantine  5 mg Oral BID    multivitamin with minerals  1 tablet Oral Daily   polyethylene glycol  17 g Oral Daily   QUEtiapine  50 mg Oral QHS   sertraline  25 mg Oral Daily   sodium chloride flush  3 mL Intravenous Q12H   tamsulosin  0.4 mg Oral BID   Continuous Infusions:  sodium chloride      ceFAZolin (ANCEF) IV  PRN Meds:.sodium chloride, acetaminophen **OR** acetaminophen, haloperidol lactate, ondansetron **OR** ondansetron (ZOFRAN) IV, oxyCODONE, sodium chloride flush Medications Prior to Admission:  Prior to Admission medications   Medication Sig Start Date End Date Taking? Authorizing Provider  apixaban (ELIQUIS) 5 MG TABS tablet Take 1 tablet (5 mg total) by mouth 2 (two) times daily. 12/13/20  Yes Deboraha Sprang, MD  atorvastatin (LIPITOR) 40 MG tablet Take 1 tablet (40 mg total) by mouth daily. 01/15/21 01/15/22 Yes Masoud, Jarrett Soho, MD  diltiazem (CARDIZEM CD) 120 MG 24 hr capsule Take 1 capsule (120 mg total) by mouth daily. Take as needed for HR greater than 140 bpm 01/15/21 02/14/21 Yes Masoud, Jarrett Soho, MD  glipiZIDE (GLUCOTROL XL) 5 MG 24 hr tablet Take 10 mg by mouth daily with breakfast. 01/13/19 12/05/21 Yes [provider]  levothyroxine (SYNTHROID) 125 MCG tablet Take 125 mcg by mouth daily before breakfast.    Yes [provider]  memantine (NAMENDA) 5 MG tablet Take 5 mg by mouth 2 (two) times daily.  02/19/18  Yes [provider]  metronidazole (NORITATE) 1 % cream Apply 1 application topically daily as needed.   Yes [provider]  Multiple Vitamin (MULTIVITAMIN) tablet Take 1 tablet by mouth daily.   Yes [provider]  QUEtiapine (SEROQUEL) 50 MG tablet Take 50 mg by mouth at bedtime.   Yes [provider]  sertraline (ZOLOFT) 25 MG tablet Take 25 mg by mouth daily.  12/12/18  Yes [provider]  tamsulosin (FLOMAX) 0.4 MG CAPS capsule Take 0.4 mg by mouth 2 (two) times daily.   Yes [provider]  lovastatin (MEVACOR) 40 MG  tablet Take 40 mg by mouth at bedtime.   01/15/21  [provider]   No Known Allergies Review of Systems  Unable to perform ROS  Physical Exam Pulmonary:     Effort: Pulmonary effort is normal.  Neurological:     Mental Status: He is alert. He is disoriented.    Vital Signs: BP (!) 109/56 (BP Location: Right Arm)   Pulse 90   Temp 97.7 F (36.5 C) (Oral)   Resp 20   Ht 5' 7"  (1.702 m)   Wt 71.7 kg   SpO2 97%   BMI 24.76 kg/m  Pain Scale: 0-10 POSS *See Group Information*: S-Acceptable,Sleep, easy to arouse Pain Score: 0-No pain   SpO2: SpO2: 97 % O2 Device:SpO2: 97 % O2 Flow Rate: .O2 Flow Rate (L/min): 2 L/min  IO: Intake/output summary:  Intake/Output Summary (Last 24 hours) at 02/15/2021 1444 Last data filed at 02/15/2021 1337 Gross per 24 hour  Intake 600 ml  Output 0 ml  Net 600 ml    LBM: Last BM Date: 02/11/21 Baseline Weight: Weight: 75.6 kg Most recent weight: Weight: 71.7 kg       Time In: 2:20 Time Out: 2:50 Time Total: 30 min Greater than 50%  of this time was spent counseling and coordinating care related to the above assessment and plan.  Signed by: Asencion Gowda, NP   Please contact Palliative Medicine Team phone at [phone] for questions and concerns.  For individual provider: See Shea Evans

## 2021-02-15 NOTE — Progress Notes (Signed)
Inpatient Diabetes Program Recommendations  AACE/ADA: New Consensus Statement on Inpatient Glycemic Control   Target Ranges:  Prepandial:   less than 140 mg/dL      Peak postprandial:   less than 180 mg/dL (1-2 hours)      Critically ill patients:  140 - 180 mg/dL   Results for Ernest [name], Ernest [name] (MRN [id number]) as of 02/15/2021 12:20  Ref. Range 02/14/2021 05:54 02/14/2021 12:06 02/14/2021 16:34 02/14/2021 21:10 02/15/2021 07:44 02/15/2021 11:43  Glucose-Capillary Latest Ref Range: 70 - 99 mg/dL 313 (H) 311 (H) 272 (H) 199 (H) 259 (H) 309 (H)   Review of Glycemic Control  Diabetes history: DM2 Outpatient Diabetes medications: Glipizide XL 10 mg QAM Current orders for Inpatient glycemic control: Novolog 0-15 units TID with meals, Novolog 0-5 units QHS  Inpatient Diabetes Program Recommendations:    Insulin: Please consider ordering Semglee 7 units Q24H and Novolog 3 units TID with meals for meal coverage if patient eats at least 50% of meals.  Thanks, Barnie Alderman, RN, MSN, CDE Diabetes Coordinator Inpatient Diabetes Program [phone] (Team Pager from 8am to 5pm)

## 2021-02-15 NOTE — Care Management Important Message (Signed)
Important Message  Patient Details  Name: Ernest [name] MRN: [id number] Date of Birth: 1936/01/27   Medicare Important Message Given:  N/A - LOS <3 / Initial given by admissions     Juliann Pulse A Revia [name] 02/15/2021, 10:12 AM

## 2021-02-15 NOTE — Evaluation (Signed)
Physical Therapy Evaluation Patient Details Name: Ernest [name] MRN: [id number] DOB: 1936/01/09 Today's Date: 02/15/2021   History of Present Illness  Patient is a 85 y.o. male with medical history significant for Paroxysmal A. fib on Eliquis, dementia, BPH, hypothyroidism, type 2 diabetes and hypertension who was brought to the ED by EMS for evaluation of low back pain after fall and worsening confusion. Found to have closed wedge compression fracture of L1. Status post kyphoplasty L1 on 02/14/21.  Clinical Impression  Patient has a history of dementia and needs supervision at home for safety with mobility at baseline. Patient is usually able to ambulate short distances without assistive device. Multiple recent falls at home and assistance required for ADLs at baseline due to cognition.  Patient is groggy and restless at times during session. When awake, he is pulling and reaching out for sheets, gown, brief (busy apron ordered). He is able to sit up on edge of bed with maximal assistance and multi-modal cues. Multiple standing bouts attempted, however patient was unable to due to cognition and generalized weakness. Did not attempt to don TLSO due to cognition. Patient has fair sitting balance but requires occasional minimal assistance due to impulsive nature, anterior lean at times. He is a high fall risk. He is not at his baseline level of functional mobility. Recommend PT to maximize independence and decrease caregiver burden. SNF is recommended at discharge, however spouse may wish to bring patient home.     Follow Up Recommendations SNF;Supervision for mobility/OOB    Equipment Recommendations  Other (comment) (would need hospital bed and 3in1 if going home.)    Recommendations for Other Services       Precautions / Restrictions Precautions Precautions: Fall;Back Precaution Booklet Issued: No Required Braces or Orthoses: Spinal Brace Spinal Brace: Thoracolumbosacral orthotic;Applied in  sitting position      Mobility  Bed Mobility Overal bed mobility: Needs Assistance Bed Mobility: Sit to Supine;Supine to Sit     Supine to sit: Max assist Sit to supine: Max assist   General bed mobility comments: assistance for BLE and trunk support. multi-modal cues for participation. mild grimicing noted with mobility. patient is not agitated with mobility, but needs frequent cues for attention to task and re-direction. patient grasping at gown, brief.    Transfers Overall transfer level: Needs assistance               General transfer comment: attempted sit to stand transfer and later scoot transfer, however patient unable to complete. limited due to cognition and generalized weakness. patient does attempt standing multiple times, but unable to clear hips from bed surface. patient fatigued with minimal activity  Ambulation/Gait                Stairs            Wheelchair Mobility    Modified Rankin (Stroke Patients Only)       Balance Overall balance assessment: History of Falls;Needs assistance Sitting-balance support: Feet supported;Bilateral upper extremity supported Sitting balance-Leahy Scale: Poor Sitting balance - Comments: patient leans anteriorly at times, not likely true loss of balance but impulsive behavior. minimal assistance to stand by assistance provided for safety to prevent slipping off bed                                     Pertinent Vitals/Pain Pain Assessment: Faces Faces Pain Scale: Hurts a little bit  Pain Location: lower back Pain Descriptors / Indicators: Grimacing Pain Intervention(s): Repositioned (grimicing intermittently with movement)    Home Living Family/patient expects to be discharged to:: Private residence Living Arrangements: Spouse/significant other Available Help at Discharge: Family;Available 24 hours/day Type of Home: House Home Access: Stairs to enter   [employer] of Steps:  2 Home Layout: One level   Additional Comments: needs 24 hour supervison at baseline due to cognitive concerns. spouse reports he has sundowners starting around 1-2 PM daily. very confused even at home and needs re-direction frequently    Prior Function Level of Independence: Needs assistance   Gait / Transfers Assistance Needed: independent without assistive device, needs supervision from spouse for safety concerns. multiple recent falls.  ADL's / Homemaking Assistance Needed: spouse assist with all ADLs including feeding        Hand Dominance        Extremity/Trunk Assessment   Upper Extremity Assessment Upper Extremity Assessment: Difficult to assess due to impaired cognition (AROM appears [facility] for functional activity based on observation)    Lower Extremity Assessment Lower Extremity Assessment: Difficult to assess due to impaired cognition (patient moving BLE in bed, partial weight bearing with standing attempts. likely generalized weakness)       Communication   Communication: HOH  Cognition Arousal/Alertness: Lethargic Behavior During Therapy: Restless Overall Cognitive Status: History of cognitive impairments - at baseline                                 General Comments: patient lethargic at times but able to sustain eye opening for participation with PT. patient needs multi-modal cues for participation and extra time required to complete tasks.      General Comments      Exercises     Assessment/Plan    PT Assessment Patient needs continued PT services  PT Problem List Decreased strength;Decreased activity tolerance;Decreased range of motion;Decreased balance;Decreased mobility;Decreased safety awareness;Decreased cognition;Decreased knowledge of use of DME;Decreased knowledge of precautions       PT Treatment Interventions DME instruction;Gait training;Stair training;Functional mobility training;Therapeutic activities;Therapeutic  exercise;Balance training;Neuromuscular re-education;Cognitive remediation;Patient/family education    PT Goals (Current goals can be found in the Care Plan section)  Acute Rehab PT Goals Patient Stated Goal: patient unable to participate in goal setting PT Goal Formulation: With family Time For Goal Achievement: 03/01/21 Potential to Achieve Goals: Poor    Frequency Min 2X/week   Barriers to discharge  (safety concerns with cognition)      Co-evaluation               AM-PAC PT "6 Clicks" Mobility  Outcome Measure Help needed turning from your back to your side while in a flat bed without using bedrails?: A Lot Help needed moving from lying on your back to sitting on the side of a flat bed without using bedrails?: A Lot Help needed moving to and from a bed to a chair (including a wheelchair)?: Total Help needed standing up from a chair using your arms (e.g., wheelchair or bedside chair)?: Total Help needed to walk in hospital room?: Total Help needed climbing 3-5 steps with a railing? : Total 6 Click Score: 8    End of Session   Activity Tolerance: Patient limited by lethargy (limited due to cognition) Patient left: in bed;with call bell/phone within reach;with bed alarm set;with family/visitor present Nurse Communication: Mobility status PT Visit Diagnosis: Unsteadiness on feet (R26.81);Muscle weakness (  generalized) (M62.81);Repeated falls (R29.6)    Time: [id number] PT Time Calculation (min) (ACUTE ONLY): 31 min   Charges:   PT Evaluation $PT Eval Moderate Complexity: 1 Mod PT Treatments $Therapeutic Activity: 8-22 mins        {Kaylob Wallen Quentin Cornwall, PT, MPT   Percell Locus 02/15/2021, 10:37 AM

## 2021-02-15 NOTE — TOC Progression Note (Addendum)
Transition of Care [facility]) - Progression Note    Patient Details  Name: Ernest [name] MRN: [id number] Date of Birth: [birth year]  Transition of Care [facility]) CM/SW [place], RN Phone Number: 02/15/2021, 10:16 AM  Clinical Narrative:      Spoke with the patients wife at the bed side she would like him to go home. She would like to get [facility] services, He has a Rolling walker at home, she would like to get a hospital bed. I spoke to the Psychologist, occupational about this patient's needs and they will review for the hospital bed and [facility] Services. He will likely also need a 3 in 1, will continue to monitor for needs. [facility] has accepted the patient for [facility] services      Expected Discharge Plan and Services                                                 Social Determinants of Health (SDOH) Interventions    Readmission Risk Interventions No flowsheet data found.

## 2021-02-15 NOTE — TOC Progression Note (Signed)
Transition of Care [facility]) - Progression Note    Patient Details  Name: Ernest [name] MRN: [id number] Date of Birth: 02/13/1936  Transition of Care [facility]) CM/SW [place], RN Phone Number: 02/15/2021, 3:50 PM  Clinical Narrative:      Plan to DC home with [place] tomorrow, Called [facility] and requested EMS auth to DC home, awaiting insurance approval for [facility]      Expected Discharge Plan and Services                                                 Social Determinants of Health (SDOH) Interventions    Readmission Risk Interventions No flowsheet data found.

## 2021-02-15 NOTE — Progress Notes (Addendum)
[facility]) Hospital Liaison RN Note  Received request from Belia Heman, RN [place] for hospice services at home after discharge. Chart and patient information reviewed by [facility] physician. Hospice eligibility confirmed.   Spoke with Ricka Burdock, patient's wife, to initiate education related to hospice philosophy, services and team approach to care. Wife verbalized understanding of information provided. Per discussion, the plan is for discharge home by EMS on 8.10.22.   DME needs discussed. Patient has the following equipment in the home: walker. Wife requests the following equipment for delivery: hospital bed and 3 in 1. Address has been verified and is correct in the chart. Neamiah Fredericks at [phone] is the family contact to arrange time of equipment delivery. Per Belia Heman, RN [facility] Manager, DME had already been set up for delivery on 8.10.22.  Please send signed and completed DNR home with patient/family. Please provide prescriptions at discharge as needed to ensure ongoing symptom management.   ACC information and contact numbers given to [employer]. Above information shared with Belia Heman, RN [facility] Manager.   Please call with any hospice related questions.   Thank you for the opportunity to participate in this patient's care.   Bobbie "Loren Racer, RN, BSN [facility] Liaison [phone]

## 2021-02-16 LAB — BASIC METABOLIC PANEL
Anion gap: 10 (ref 5–15)
BUN: 37 mg/dL — ABNORMAL HIGH (ref 8–23)
CO2: 24 mmol/L (ref 22–32)
Calcium: 10.1 mg/dL (ref 8.9–10.3)
Chloride: 102 mmol/L (ref 98–111)
Creatinine, Ser: 0.63 mg/dL (ref 0.61–1.24)
GFR, Estimated: >60 mL/min (ref >60)
Glucose, Bld: 288 mg/dL — ABNORMAL HIGH (ref 70–99)
Potassium: 3.9 mmol/L (ref 3.5–5.1)
Sodium: 136 mmol/L (ref 135–145)

## 2021-02-16 LAB — GLUCOSE, CAPILLARY
Glucose-Capillary: 287 mg/dL — ABNORMAL HIGH (ref 70–99)
Glucose-Capillary: 380 mg/dL — ABNORMAL HIGH (ref 70–99)
Glucose-Capillary: 202 mg/dL — ABNORMAL HIGH (ref 70–99)

## 2021-02-16 LAB — CBC
HCT: 41.6 % (ref 39.0–52.0)
Hemoglobin: 15.1 g/dL (ref 13.0–17.0)
MCH: 31.3 pg (ref 26.0–34.0)
MCHC: 36.3 g/dL — ABNORMAL HIGH (ref 30.0–36.0)
MCV: 86.1 fL (ref 80.0–100.0)
Platelets: 173 10*3/uL (ref 150–400)
RBC: 4.83 MIL/uL (ref 4.22–5.81)
RDW: 12.4 % (ref 11.5–15.5)
WBC: 9.9 10*3/uL (ref 4.0–10.5)
nRBC: 0 % (ref 0.0–0.2)

## 2021-02-16 LAB — MAGNESIUM: Magnesium: 2 mg/dL (ref 1.7–2.4)

## 2021-02-16 MED ORDER — TRAZODONE HCL 100 MG PO TABS: 100.0000 mg | ORAL_TABLET | Freq: Every day | ORAL | 0 refills | Status: AC

## 2021-02-16 MED ORDER — INSULIN ASPART 100 UNIT/ML IJ SOLN
3.0000 [IU] | Freq: Three times a day (TID) | INTRAMUSCULAR | Status: DC
  Administered 2021-02-16 (×2): 3 [IU] via SUBCUTANEOUS
  Filled 2021-02-16 (×2): qty 1

## 2021-02-16 MED ORDER — INSULIN GLARGINE-YFGN 100 UNIT/ML ~~LOC~~ SOLN
7.0000 [IU] | Freq: Every day | SUBCUTANEOUS | Status: DC
  Administered 2021-02-16: 7 [IU] via SUBCUTANEOUS
  Filled 2021-02-16 (×2): qty 0.07

## 2021-02-16 NOTE — TOC Progression Note (Signed)
Transition of Care [facility]) - Progression Note    Patient Details  Name: Ernest [name] MRN: [id number] Date of Birth: [birth year]  Transition of Care [facility]) CM/SW [place], RN Phone Number: 02/16/2021, 10:41 AM  Clinical Narrative:     Patient to DC home today after 1 PM after the hospital bed and 3 in 1 is delivered, EMS requested approval for [facility]        Expected Discharge Plan and Services           Expected Discharge Date: 02/16/21                                     Social Determinants of Health (SDOH) Interventions    Readmission Risk Interventions No flowsheet data found.

## 2021-02-16 NOTE — Progress Notes (Addendum)
[place] [facility]) Hospital Liaison RN Note  Visited patient at bedside. Patient resting in bed. Wife present. Belia Heman, RN [facility] Manager and wife state equipment to be delivered at 1 pm today. Plan is for patient to discharge home via EMS once equipment is delivered.   Hospital liaison will continue to follow through discharge disposition. Hospice admission visit to occur once patient returns home.   Please do not hesitate to call with any hospice related questions.   Thank you,   Bobbie "Loren Racer, [place], BSN [facility] Liaison [phone]

## 2021-02-16 NOTE — Discharge Summary (Signed)
Physician Discharge Summary  Ernest [name] [id number] DOB: 1936/04/03 DOA: 02/11/2021  PCP: Derinda Late, MD  Admit date: 02/11/2021 Discharge date: 02/16/2021  Admitted From: Home Disposition:  Home with hospice  Recommendations for Outpatient Follow-up:  Follow directions of hospice providers  Home Health: No Equipment/Devices: None  Discharge Condition: Hospice CODE STATUS: DNR Diet recommendation: Dysphagia  Brief/Interim Summary: Ernest [name] is a 85 y.o. male with medical history significant for Paroxysmal A. fib on Eliquis, dementia, BPH, hypothyroidism, type 2 diabetes and hypertension who was brought to the ED by EMS for evaluation of low back pain and worsening confusion. Patient's wife notes that he fell about a week ago and was able to get back up with assistance from family.  The night prior to his admission he fell again landing on his right side and was unable to get up.  His nephew was able to get him up and put him in bed but patient started complaining of pain in his lower back and inability to get up.  She also notes that he has become increasingly confused and agitated and is difficult to redirect.  She is his only caregiver and states that his worsening condition has taken a toll on her health.  After repeated discussions decision was made to proceed with hospice referral.  Hospice liaison involved in care.  Felt patient was appropriate for home with hospice services.  Wife will be primary caregiver.  At time of discharge I have continued his home medications and will defer further discussion and discontinuation of these medications to outpatient hospice providers.   Discharge Diagnoses:  Principal Problem:   Compression fracture of lumbar vertebra (HCC) Active Problems:   Type 2 diabetes mellitus (HCC)   PAF (paroxysmal atrial fibrillation) (HCC)   Coronary artery disease   Essential hypertension   Dementia with behavioral disturbance (HCC)    Falls   Hypothyroidism   Compression fracture of lumbosacral spine (HCC)  compression fracture of lumbar vertebrae Pain control with scheduled Tylenol, Lidoderm patch and as needed oxycodone --kyphoplasty on 8/8 with apparent improvement in pain. -Wife concerned that narcotics will exacerbate delirium.  Will not prescribe on discharge.  Defer to hospice providers   Dementia with behavioral disturbances -- On home regimen at time of discharge.  Will not discontinue.   Hypothyroidism --cont Syndhroid   History of paroxysmal atrial fibrillation --cont cardizem -- Continued Eliquis at time of discharge but this medication can likely be discontinued   BPH Continue Flomax   Frequent falls --[facility] PT/OT not ordered because pt can't follow commands   Discharge Instructions  Discharge Instructions     Diet - low sodium heart healthy   Complete by: As directed    Increase activity slowly   Complete by: As directed    No wound care   Complete by: As directed       Allergies as of 02/16/2021   No Known Allergies      Medication List     TAKE these medications    atorvastatin 40 MG tablet Commonly known as: Lipitor Take 1 tablet (40 mg total) by mouth daily.   diltiazem 120 MG 24 hr capsule Commonly known as: CARDIZEM CD Take 1 capsule (120 mg total) by mouth daily. Take as needed for HR greater than 140 bpm   Eliquis 5 MG Tabs tablet Generic drug: apixaban Take 1 tablet (5 mg total) by mouth 2 (two) times daily.   glipiZIDE 5 MG 24 hr tablet Commonly known  as: GLUCOTROL XL Take 10 mg by mouth daily with breakfast.   levothyroxine 125 MCG tablet Commonly known as: SYNTHROID Take 125 mcg by mouth daily before breakfast.   memantine 5 MG tablet Commonly known as: NAMENDA Take 5 mg by mouth 2 (two) times daily.   metronidazole 1 % cream Commonly known as: NORITATE Apply 1 application topically daily as needed.   multivitamin tablet Take 1 tablet by mouth  daily.   QUEtiapine 50 MG tablet Commonly known as: SEROQUEL Take 50 mg by mouth at bedtime.   sertraline 25 MG tablet Commonly known as: ZOLOFT Take 25 mg by mouth daily.   tamsulosin 0.4 MG Caps capsule Commonly known as: FLOMAX Take 0.4 mg by mouth 2 (two) times daily.   traZODone 100 MG tablet Commonly known as: DESYREL Take 1 tablet (100 mg total) by mouth at bedtime.               Durable Medical Equipment  (From admission, onward)           Start     Ordered   02/15/21 1026  For home use only DME Hospital bed  Once       Question Answer Comment  Length of Need Lifetime   Patient has (list medical condition): Dementia, compression fracture of spine   The above medical condition requires: Patient requires the ability to reposition frequently   Bed type Semi-electric   Support Surface: Gel Overlay      02/15/21 1026            No Known Allergies  Consultations: None   Procedures/Studies: DG Lumbar Spine 2-3 Views  Result Date: 02/14/2021 CLINICAL DATA:  L1 kyphoplasty. EXAM: LUMBAR SPINE - 2-3 VIEW; DG C-ARM 1-60 MIN COMPARISON:  Reformats from abdominopelvic CT 02/11/2021 FINDINGS: Two fluoroscopic spot views of the lumbar spine obtained in the operating room in frontal and lateral projections. Kyphoplasty material within L1 vertebra. Total fluoroscopy time 45 seconds. IMPRESSION: Fluoroscopic spot views for L1 kyphoplasty. Electronically Signed   By: Keith Rake M.D.   On: 02/14/2021 16:40   CT HEAD WO CONTRAST (5MM)  Result Date: 02/11/2021 CLINICAL DATA:  Head trauma, minor (Age >= 65y); Neck trauma (Age >= 65y) EXAM: CT HEAD WITHOUT CONTRAST CT CERVICAL SPINE WITHOUT CONTRAST TECHNIQUE: Multidetector CT imaging of the head and cervical spine was performed following the standard protocol without intravenous contrast. Multiplanar CT image reconstructions of the cervical spine were also generated. COMPARISON:  08/10/2011 FINDINGS: CT HEAD  FINDINGS Brain: No evidence of acute infarction, hemorrhage, hydrocephalus, extra-axial collection or mass lesion/mass effect. Extensive low-density changes within the periventricular and subcortical white matter compatible with chronic microvascular ischemic change, progressed from prior. Mild-moderate diffuse cerebral volume loss. Vascular: Atherosclerotic calcifications involving the large vessels of the skull base. No unexpected hyperdense vessel. Skull: Normal. Negative for fracture or focal lesion. Sinuses/Orbits: No acute finding. Other: Negative for scalp hematoma. CT CERVICAL SPINE FINDINGS Alignment: Facet joints are aligned without dislocation or traumatic listhesis. Dens and lateral masses are aligned. Skull base and vertebrae: No acute fracture. No primary bone lesion or focal pathologic process. Soft tissues and spinal canal: No prevertebral fluid or swelling. No visible canal hematoma. Disc levels: Degenerative disc disease most pronounced at C3-4 with associated uncovertebral spurring and disc height loss. Mild multilevel facet arthrosis. Upper chest: Negative. Other: Bilateral carotid atherosclerosis. IMPRESSION: 1. No acute intracranial findings. 2. Advanced chronic microvascular ischemic change and cerebral volume loss, progressed from prior. 3. No  acute fracture or traumatic listhesis of the cervical spine. 4. Mild multilevel cervical spondylosis, most pronounced at C3-4. Electronically Signed   By: Davina Poke D.O.   On: 02/11/2021 11:55   CT Cervical Spine Wo Contrast  Result Date: 02/11/2021 CLINICAL DATA:  Head trauma, minor (Age >= 65y); Neck trauma (Age >= 65y) EXAM: CT HEAD WITHOUT CONTRAST CT CERVICAL SPINE WITHOUT CONTRAST TECHNIQUE: Multidetector CT imaging of the head and cervical spine was performed following the standard protocol without intravenous contrast. Multiplanar CT image reconstructions of the cervical spine were also generated. COMPARISON:  08/10/2011 FINDINGS: CT  HEAD FINDINGS Brain: No evidence of acute infarction, hemorrhage, hydrocephalus, extra-axial collection or mass lesion/mass effect. Extensive low-density changes within the periventricular and subcortical white matter compatible with chronic microvascular ischemic change, progressed from prior. Mild-moderate diffuse cerebral volume loss. Vascular: Atherosclerotic calcifications involving the large vessels of the skull base. No unexpected hyperdense vessel. Skull: Normal. Negative for fracture or focal lesion. Sinuses/Orbits: No acute finding. Other: Negative for scalp hematoma. CT CERVICAL SPINE FINDINGS Alignment: Facet joints are aligned without dislocation or traumatic listhesis. Dens and lateral masses are aligned. Skull base and vertebrae: No acute fracture. No primary bone lesion or focal pathologic process. Soft tissues and spinal canal: No prevertebral fluid or swelling. No visible canal hematoma. Disc levels: Degenerative disc disease most pronounced at C3-4 with associated uncovertebral spurring and disc height loss. Mild multilevel facet arthrosis. Upper chest: Negative. Other: Bilateral carotid atherosclerosis. IMPRESSION: 1. No acute intracranial findings. 2. Advanced chronic microvascular ischemic change and cerebral volume loss, progressed from prior. 3. No acute fracture or traumatic listhesis of the cervical spine. 4. Mild multilevel cervical spondylosis, most pronounced at C3-4. Electronically Signed   By: Davina Poke D.O.   On: 02/11/2021 11:55   CT ABDOMEN PELVIS W CONTRAST  Result Date: 02/11/2021 CLINICAL DATA:  Abdominal trauma, minor. EXAM: CT ABDOMEN AND PELVIS WITH CONTRAST TECHNIQUE: Multidetector CT imaging of the abdomen and pelvis was performed using the standard protocol following bolus administration of intravenous contrast. CONTRAST:  72m OMNIPAQUE IOHEXOL 350 MG/ML SOLN COMPARISON:  11/15/2017 FINDINGS: Lower chest: No posttraumatic finding. Atherosclerosis of the aorta and  coronaries. Dual-chamber pacer with leads looping into the IVC. Hepatobiliary: Steatosis of the liver.No evidence of biliary obstruction or stone. Pancreas: Generalized fatty infiltration. Spleen: Unremarkable. Adrenals/Urinary Tract: Negative adrenals. No hydronephrosis or stone. Small renal cystic densities. Prominent bladder wall thickness, likely related to chronic prostate enlargement. Stomach/Bowel: No obstruction. No appendicitis. Innumerable sigmoid diverticula. No evidence of bowel injury. Vascular/Lymphatic: No acute vascular abnormality. Extensive atheromatous calcification of the aorta and iliacs. Chronic narrowing at the celiac origin which is accentuated compared to prior, shape implicating median arcuate ligament in addition to atherosclerosis. No mass or adenopathy. Reproductive:No pathologic findings. Other: No ascites or pneumoperitoneum. Musculoskeletal: No acute abnormalities. Laminectomies at L3 and L4. L1 inferior endplate fracture with horizontal lucency following the depressed inferior endplate, acute to subacute in appearance IMPRESSION: 1. Acute or subacute L1 compression fracture with mild height loss. 2. No evidence of intra-abdominal injury. 3. Chronic findings are described above. Aortic Atherosclerosis (ICD10-I70.0). Electronically Signed   By: JMonte FantasiaM.D.   On: 02/11/2021 11:57   DG Chest Port 1 View  Result Date: 02/11/2021 CLINICAL DATA:  Altered mental status. EXAM: PORTABLE CHEST 1 VIEW COMPARISON:  01/14/2021 FINDINGS: Numerous leads and wires project over the chest. Pacer with leads at right atrium and right ventricle. No lead discontinuity. Midline trachea. Normal heart size and mediastinal  contours for age. Atherosclerosis in the transverse aorta. Mild right hemidiaphragm elevation. No pleural effusion or pneumothorax. Mild biapical pleural thickening. Low lung volumes with resultant pulmonary interstitial prominence. No congestive failure. Mild bibasilar  subsegmental atelectasis, similar. IMPRESSION: Low lung volumes with mild subsegmental atelectasis. No evidence of pneumonia or other explanation for altered mental status. Aortic Atherosclerosis (ICD10-I70.0). Electronically Signed   By: Abigail Miyamoto M.D.   On: 02/11/2021 10:14   DG Toe Great Left  Result Date: 02/11/2021 CLINICAL DATA:  Toe injury EXAM: LEFT GREAT TOE COMPARISON:  None. FINDINGS: Nondisplaced fracture of the distal phalanx of the great toe. No significant arthropathy. IMPRESSION: Nondisplaced fracture distal first phalanx left foot Electronically Signed   By: Franchot Gallo M.D.   On: 02/11/2021 14:23   DG C-Arm 1-60 Min  Result Date: 02/14/2021 CLINICAL DATA:  L1 kyphoplasty. EXAM: LUMBAR SPINE - 2-3 VIEW; DG C-ARM 1-60 MIN COMPARISON:  Reformats from abdominopelvic CT 02/11/2021 FINDINGS: Two fluoroscopic spot views of the lumbar spine obtained in the operating room in frontal and lateral projections. Kyphoplasty material within L1 vertebra. Total fluoroscopy time 45 seconds. IMPRESSION: Fluoroscopic spot views for L1 kyphoplasty. Electronically Signed   By: Keith Rake M.D.   On: 02/14/2021 16:40   (Echo, Carotid, EGD, Colonoscopy, ERCP)    Subjective: Seen and examined at the time of discharge.  No visible distress.  Is agitated.  Wife at bedside.  States this is baseline.  Discharge Exam: Vitals:   02/16/21 0401 02/16/21 0749  BP: (!) 149/80 (!) 131/94  Pulse: 76 77  Resp: 20 15  Temp: (!) 97.5 F (36.4 C) 97.7 F (36.5 C)  SpO2: 93% 98%   Vitals:   02/15/21 1957 02/16/21 0142 02/16/21 0401 02/16/21 0749  BP: 131/73 137/81 (!) 149/80 (!) 131/94  Pulse: 71 75 76 77  Resp: '20 20 20 15  '$ Temp: 98 F (36.7 C) 98 F (36.7 C) (!) 97.5 F (36.4 C) 97.7 F (36.5 C)  TempSrc:      SpO2: 97% 100% 93% 98%  Weight:      Height:        General: Patient is awake, confused, does not follow commands Cardiovascular: RRR, S1/S2 +, no rubs, no gallops Respiratory:  CTA bilaterally, no wheezing, no rhonchi Abdominal: Soft, NT, ND, bowel sounds + Extremities: no edema, no cyanosis    The results of significant diagnostics from this hospitalization (including imaging, microbiology, ancillary and laboratory) are listed below for reference.     Microbiology: Recent Results (from the past 240 hour(s))  Resp Panel by RT-PCR (Flu A&B, Covid)     Status: None   Collection Time: 02/12/21  1:50 AM  Result Value Ref Range Status   SARS Coronavirus 2 by RT PCR NEGATIVE NEGATIVE Final    Comment: (NOTE) SARS-CoV-2 target nucleic acids are NOT DETECTED.  The SARS-CoV-2 RNA is generally detectable in upper respiratory specimens during the acute phase of infection. The lowest concentration of SARS-CoV-2 viral copies this assay can detect is 138 copies/mL. A negative result does not preclude SARS-Cov-2 infection and should not be used as the sole basis for treatment or other patient management decisions. A negative result may occur with  improper specimen collection/handling, submission of specimen other than nasopharyngeal swab, presence of viral mutation(s) within the areas targeted by this assay, and inadequate number of viral copies(<138 copies/mL). A negative result must be combined with clinical observations, patient history, and epidemiological information. The expected result is Negative.  Fact  Sheet for Patients:  EntrepreneurPulse.com.au  Fact Sheet for Healthcare Providers:  IncredibleEmployment.be  This test is no t yet approved or cleared by the [place] FDA and  has been authorized for detection and/or diagnosis of SARS-CoV-2 by FDA under an Emergency Use Authorization (EUA). This EUA will remain  in effect (meaning this test can be used) for the duration of the COVID-19 declaration under Section 564(b)(1) of the Act, 21 U.S.C.section 360bbb-3(b)(1), unless the authorization is terminated  or  revoked sooner.       Influenza A by PCR NEGATIVE NEGATIVE Final   Influenza B by PCR NEGATIVE NEGATIVE Final    Comment: (NOTE) The Xpert Xpress SARS-CoV-2/FLU/RSV plus assay is intended as an aid in the diagnosis of influenza from Nasopharyngeal swab specimens and should not be used as a sole basis for treatment. Nasal washings and aspirates are unacceptable for Xpert Xpress SARS-CoV-2/FLU/RSV testing.  Fact Sheet for Patients: EntrepreneurPulse.com.au  Fact Sheet for Healthcare Providers: IncredibleEmployment.be  This test is not yet approved or cleared by the [place] FDA and has been authorized for detection and/or diagnosis of SARS-CoV-2 by FDA under an Emergency Use Authorization (EUA). This EUA will remain in effect (meaning this test can be used) for the duration of the COVID-19 declaration under Section 564(b)(1) of the Act, 21 U.S.C. section 360bbb-3(b)(1), unless the authorization is terminated or revoked.  Performed at [facility], [address] [place], [place] [address]   Surgical PCR screen     Status: None   Collection Time: 02/14/21  3:01 AM   Specimen: Nasal Mucosa; Nasal Swab  Result Value Ref Range Status   MRSA, PCR NEGATIVE NEGATIVE Final   Staphylococcus aureus NEGATIVE NEGATIVE Final    Comment: (NOTE) The Xpert SA Assay (FDA approved for NASAL specimens in patients 41 years of age and older), is one component of a comprehensive surveillance program. It is not intended to diagnose infection nor to guide or monitor treatment. Performed at [facility], [address] [place],  [address]      Labs: BNP (last 3 results) Recent Labs    03/20/20 0356 01/14/21 2041  BNP 64.8 [id number]   Basic Metabolic Panel: Recent Labs  Lab 02/11/21 0826 02/13/21 0509 02/14/21 0442 02/15/21 0355 02/16/21 0718  NA 136 139 137 138 136  K 4.4 4.8 4.4 4.5 3.9  CL 103 106 106 106 102  CO2  '25 29 26 25 24  '$ GLUCOSE 212* 246* 335* 240* 288*  BUN 20 19 26* 35* 37*  CREATININE 0.67 0.77 0.88 0.77 0.63  CALCIUM 10.2 10.3 10.6* 10.1 10.1  MG  --  2.2 2.5* 2.0 2.0   Liver Function Tests: Recent Labs  Lab 02/11/21 0826  AST 21  ALT 16  ALKPHOS 94  BILITOT 1.1  PROT 7.0  ALBUMIN 3.8   No results for input(s): LIPASE, AMYLASE in the last 168 hours. No results for input(s): AMMONIA in the last 168 hours. CBC: Recent Labs  Lab 02/11/21 0826 02/13/21 0509 02/14/21 0442 02/15/21 0355 02/16/21 0718  WBC 10.1 8.8 9.4 10.7* 9.9  NEUTROABS 6.8  --   --   --   --   HGB 14.5 15.6 15.2 15.5 15.1  HCT 40.6 44.4 42.0 44.6 41.6  MCV 84.8 86.5 87.5 86.6 86.1  PLT 189 178 176 191 173   Cardiac Enzymes: Recent Labs  Lab 02/11/21 0826  CKTOTAL 17*   BNP: Invalid input(s): POCBNP CBG: Recent Labs  Lab 02/15/21  [id number] 02/15/21 1143 02/15/21 1652 02/15/21 2031 02/16/21 0745  GLUCAP 259* 309* 296* 353* 287*   D-Dimer No results for input(s): DDIMER in the last 72 hours. Hgb A1c No results for input(s): HGBA1C in the last 72 hours. Lipid Profile No results for input(s): CHOL, HDL, LDLCALC, TRIG, CHOLHDL, LDLDIRECT in the last 72 hours. Thyroid function studies No results for input(s): TSH, T4TOTAL, T3FREE, THYROIDAB in the last 72 hours.  Invalid input(s): FREET3 Anemia work up No results for input(s): VITAMINB12, FOLATE, FERRITIN, TIBC, IRON, RETICCTPCT in the last 72 hours. Urinalysis    Component Value Date/Time   COLORURINE YELLOW (A) 02/11/2021 1827   APPEARANCEUR CLEAR (A) 02/11/2021 1827   APPEARANCEUR Clear 09/17/2013 2326   LABSPEC >1.046 (H) 02/11/2021 1827   LABSPEC 1.007 09/17/2013 2326   PHURINE 6.0 02/11/2021 1827   GLUCOSEU >=500 (A) 02/11/2021 1827   GLUCOSEU >=500 09/17/2013 2326   HGBUR NEGATIVE 02/11/2021 1827   BILIRUBINUR NEGATIVE 02/11/2021 1827   BILIRUBINUR Negative 09/17/2013 2326   KETONESUR 20 (A) 02/11/2021 1827   PROTEINUR  NEGATIVE 02/11/2021 1827   NITRITE NEGATIVE 02/11/2021 1827   LEUKOCYTESUR NEGATIVE 02/11/2021 1827   LEUKOCYTESUR Negative 09/17/2013 2326   Sepsis Labs Invalid input(s): PROCALCITONIN,  WBC,  LACTICIDVEN Microbiology Recent Results (from the past 240 hour(s))  Resp Panel by RT-PCR (Flu A&B, Covid)     Status: None   Collection Time: 02/12/21  1:50 AM  Result Value Ref Range Status   SARS Coronavirus 2 by RT PCR NEGATIVE NEGATIVE Final    Comment: (NOTE) SARS-CoV-2 target nucleic acids are NOT DETECTED.  The SARS-CoV-2 RNA is generally detectable in upper respiratory specimens during the acute phase of infection. The lowest concentration of SARS-CoV-2 viral copies this assay can detect is 138 copies/mL. A negative result does not preclude SARS-Cov-2 infection and should not be used as the sole basis for treatment or other patient management decisions. A negative result may occur with  improper specimen collection/handling, submission of specimen other than nasopharyngeal swab, presence of viral mutation(s) within the areas targeted by this assay, and inadequate number of viral copies(<138 copies/mL). A negative result must be combined with clinical observations, patient history, and epidemiological information. The expected result is Negative.  Fact Sheet for Patients:  EntrepreneurPulse.com.au  Fact Sheet for Healthcare Providers:  IncredibleEmployment.be  This test is no t yet approved or cleared by the [place] FDA and  has been authorized for detection and/or diagnosis of SARS-CoV-2 by FDA under an Emergency Use Authorization (EUA). This EUA will remain  in effect (meaning this test can be used) for the duration of the COVID-19 declaration under Section 564(b)(1) of the Act, 21 U.S.C.section 360bbb-3(b)(1), unless the authorization is terminated  or revoked sooner.       Influenza A by PCR NEGATIVE NEGATIVE Final   Influenza B  by PCR NEGATIVE NEGATIVE Final    Comment: (NOTE) The Xpert Xpress SARS-CoV-2/FLU/RSV plus assay is intended as an aid in the diagnosis of influenza from Nasopharyngeal swab specimens and should not be used as a sole basis for treatment. Nasal washings and aspirates are unacceptable for Xpert Xpress SARS-CoV-2/FLU/RSV testing.  Fact Sheet for Patients: EntrepreneurPulse.com.au  Fact Sheet for Healthcare Providers: IncredibleEmployment.be  This test is not yet approved or cleared by the [place] FDA and has been authorized for detection and/or diagnosis of SARS-CoV-2 by FDA under an Emergency Use Authorization (EUA). This EUA will remain in effect (meaning this test can be used) for the  duration of the COVID-19 declaration under Section 564(b)(1) of the Act, 21 U.S.C. section 360bbb-3(b)(1), unless the authorization is terminated or revoked.  Performed at [facility], [address] [place], [place] [address]   Surgical PCR screen     Status: None   Collection Time: 02/14/21  3:01 AM   Specimen: Nasal Mucosa; Nasal Swab  Result Value Ref Range Status   MRSA, PCR NEGATIVE NEGATIVE Final   Staphylococcus aureus NEGATIVE NEGATIVE Final    Comment: (NOTE) The Xpert SA Assay (FDA approved for NASAL specimens in patients 23 years of age and older), is one component of a comprehensive surveillance program. It is not intended to diagnose infection nor to guide or monitor treatment. Performed at [facility], [address] [place],  [address]      Time coordinating discharge: Over 30 minutes  SIGNED:   Sidney Ace, MD  [facility] 02/16/2021, 10:22 AM Pager   If 7PM-7AM, please contact night-coverage

## 2021-02-16 NOTE — TOC Progression Note (Signed)
Transition of Care [facility]) - Progression Note    Patient Details  Name: Ernest [name] MRN: [id number] Date of Birth: 1935-09-20  Transition of Care [facility]) CM/SW [place], RN Phone Number: 02/16/2021, 2:37 PM  Clinical Narrative:     [facility] to [facility] home, thee are a few patient's ahead of him, the nurse is aware       Expected Discharge Plan and Services           Expected Discharge Date: 02/16/21                                     Social Determinants of Health (SDOH) Interventions    Readmission Risk Interventions No flowsheet data found.

## 2021-02-16 NOTE — Progress Notes (Signed)
Discharge went over with patient's wife Ernest [name] with opportunity to ask question. Iv removed. Script sent to pharmacy awaiting EMS to come pick patient up.

## 2021-02-16 NOTE — Progress Notes (Signed)
Inpatient Diabetes Program Recommendations  AACE/ADA: New Consensus Statement on Inpatient Glycemic Control   Target Ranges:  Prepandial:   less than 140 mg/dL      Peak postprandial:   less than 180 mg/dL (1-2 hours)      Critically ill patients:  140 - 180 mg/dL  Results for Ernest [name], Ernest [name] (MRN [id number]) as of 02/16/2021 10:01  Ref. Range 02/15/2021 07:44 02/15/2021 11:43 02/15/2021 16:52 02/15/2021 20:31 02/16/2021 07:45  Glucose-Capillary Latest Ref Range: 70 - 99 mg/dL 259 (H) 309 (H) 296 (H) 353 (H) 287 (H)   Review of Glycemic Control  Diabetes history: DM2 Outpatient Diabetes medications: Glipizide XL 10 mg QAM Current orders for Inpatient glycemic control: Novolog 0-15 units TID with meals, Novolog 0-5 units QHS   Inpatient Diabetes Program Recommendations:     Insulin: Please consider ordering Semglee 7 units Q24H and Novolog 4 units TID with meals for meal coverage if patient eats at least 50% of meals.   Diet: If appropriate for patient, may want to consider discontinuing regular diet and ordering carb modified diet.  Thanks, Barnie Alderman, RN, MSN, CDE Diabetes Coordinator Inpatient Diabetes Program [phone] (Team Pager from 8am to 5pm)

## 2021-02-22 DIAGNOSIS — S32010A Wedge compression fracture of first lumbar vertebra, initial encounter for closed fracture: Secondary | ICD-10-CM | POA: Diagnosis not present

## 2021-02-28 ENCOUNTER — Ambulatory Visit: Payer: PPO

## 2021-03-01 LAB — CUP PACEART REMOTE DEVICE CHECK
Battery Remaining Longevity: 57 mo
Battery Voltage: 2.99 V
Brady Statistic AP VP Percent: 0.03 %
Brady Statistic AP VS Percent: 36.19 %
Brady Statistic AS VP Percent: 0.05 %
Brady Statistic AS VS Percent: 63.73 %
Brady Statistic RA Percent Paced: 34.7 %
Brady Statistic RV Percent Paced: 0.08 %
Date Time Interrogation Session: 20220823130404
Implantable Lead Implant Date: 20170206
Implantable Lead Implant Date: 20170206
Implantable Lead Location: 753859
Implantable Lead Location: 753860
Implantable Lead Model: 5076
Implantable Lead Model: 5076
Implantable Pulse Generator Implant Date: 20170206
Lead Channel Impedance Value: 399 Ohm
Lead Channel Impedance Value: 456 Ohm
Lead Channel Impedance Value: 475 Ohm
Lead Channel Impedance Value: 494 Ohm
Lead Channel Pacing Threshold Amplitude: 0.625 V
Lead Channel Pacing Threshold Amplitude: 0.75 V
Lead Channel Pacing Threshold Pulse Width: 0.4 ms
Lead Channel Pacing Threshold Pulse Width: 0.4 ms
Lead Channel Sensing Intrinsic Amplitude: 12.375 mV
Lead Channel Sensing Intrinsic Amplitude: 12.375 mV
Lead Channel Sensing Intrinsic Amplitude: 5 mV
Lead Channel Sensing Intrinsic Amplitude: 5 mV
Lead Channel Setting Pacing Amplitude: 2 V
Lead Channel Setting Pacing Amplitude: 2.5 V
Lead Channel Setting Pacing Pulse Width: 0.4 ms
Lead Channel Setting Sensing Sensitivity: 2.8 mV

## 2021-03-04 DIAGNOSIS — R0902 Hypoxemia: Secondary | ICD-10-CM | POA: Diagnosis not present

## 2021-03-04 DIAGNOSIS — R739 Hyperglycemia, unspecified: Secondary | ICD-10-CM | POA: Diagnosis not present

## 2021-03-04 DIAGNOSIS — R0689 Other abnormalities of breathing: Secondary | ICD-10-CM | POA: Diagnosis not present

## 2021-03-09 ENCOUNTER — Telehealth: Payer: Self-pay

## 2021-03-09 NOTE — Telephone Encounter (Signed)
The patient wife called because the patient passed away 03/27/21. I gave her my deepest condolences. I cancelled all upcoming home remote appointments, took the patient out of [facility], Marked him deceased in [place], and ordered the patient wife a return kit. I let her know she should receive it in 7-10 business days.

## 2021-03-10 DEATH — deceased

## 2021-09-12 IMAGING — DX DG CHEST 1V PORT
1 series · 1 of 1 positions shown · non-contrast
Comparison: 08/17/2015

CLINICAL DATA: AFib with rapid ventricular response

EXAM:
PORTABLE CHEST 1 VIEW

[chest ap]
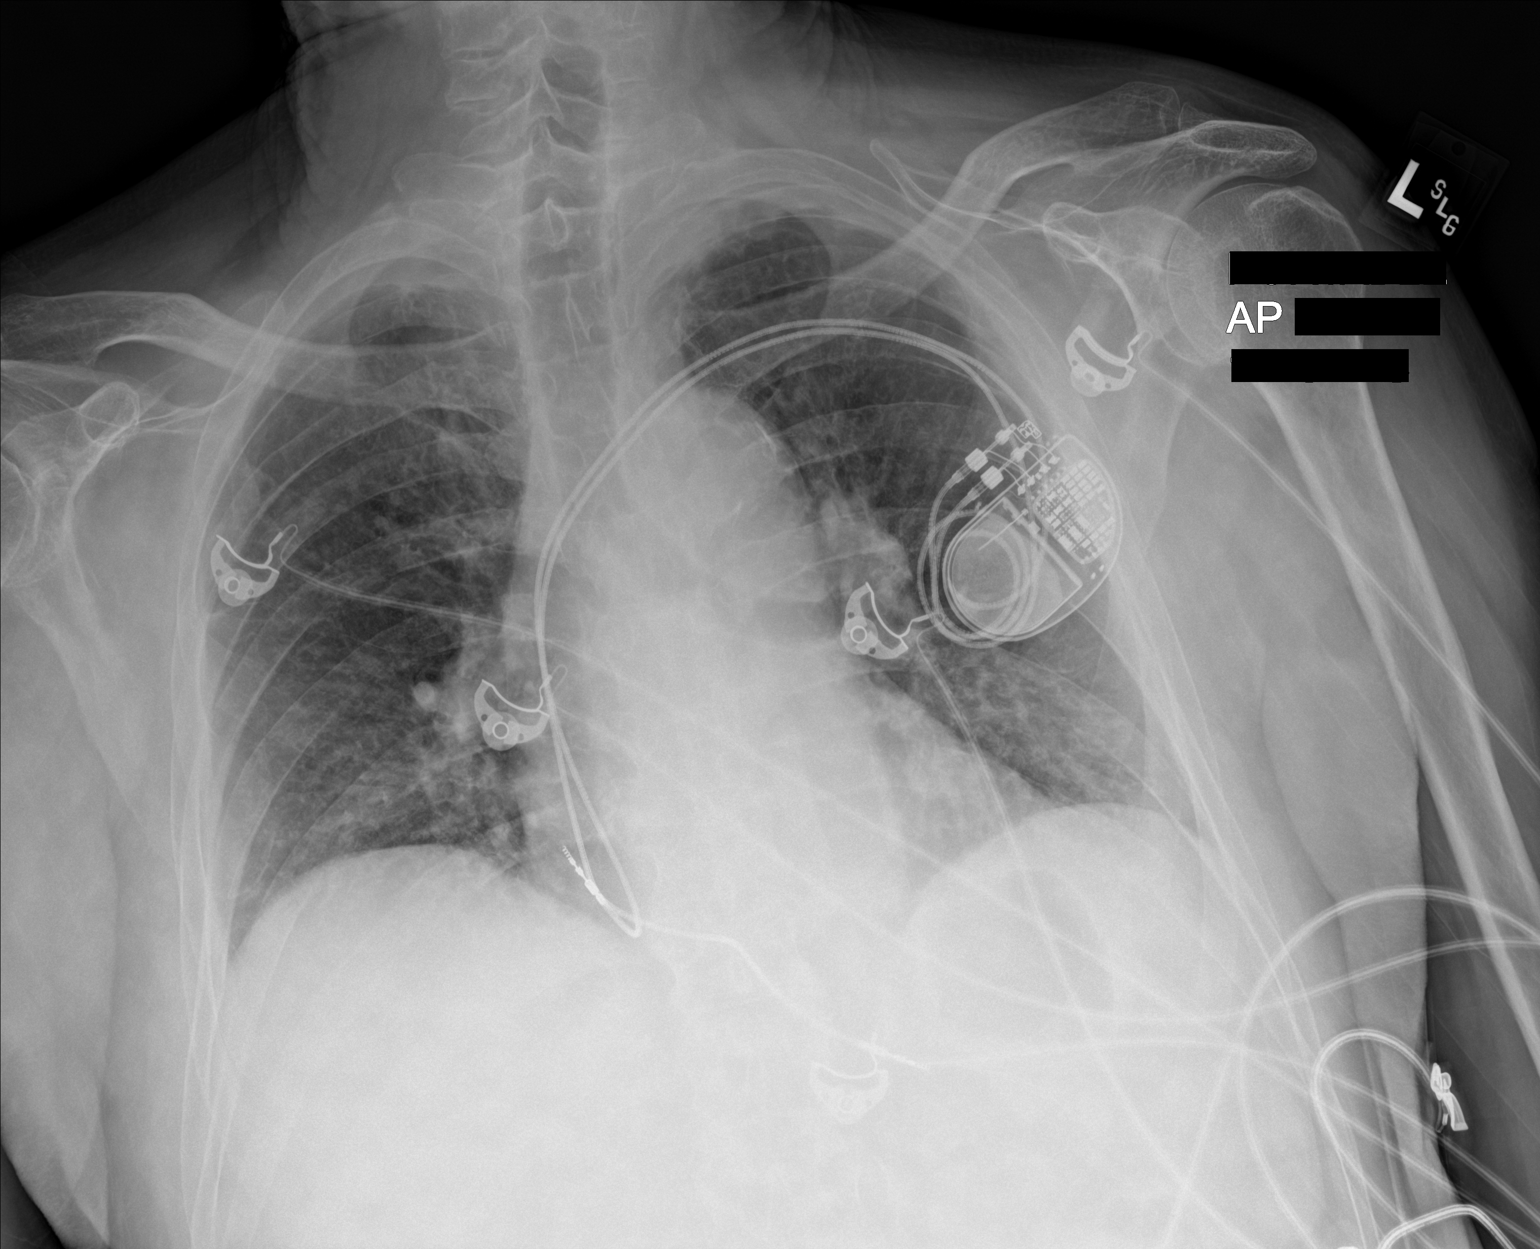

[1 of 1 positions shown; findings below may reference images not displayed]

FINDINGS: Left-sided pacing device with leads over the right atrium and right
ventricle. Low lung volumes. No consolidation, pleural effusion or
pneumothorax. Normal heart size. Aortic atherosclerosis. Apical
pleural thickening and scarring. No pneumothorax.
IMPRESSION: No active disease.  Low lung volumes.
